# Patient Record
Sex: Female | Born: 1990 | Hispanic: No | Marital: Single | Smoking: Current every day smoker
Health system: Southern US, Community
[De-identification: ages and names within clinical notes are randomized; demographics above are authoritative.]

## PROBLEM LIST (undated history)

## (undated) DIAGNOSIS — Z9889 Other specified postprocedural states: Secondary | ICD-10-CM

## (undated) DIAGNOSIS — F209 Schizophrenia, unspecified: Secondary | ICD-10-CM

## (undated) DIAGNOSIS — D649 Anemia, unspecified: Secondary | ICD-10-CM

## (undated) DIAGNOSIS — J45909 Unspecified asthma, uncomplicated: Secondary | ICD-10-CM

## (undated) DIAGNOSIS — K219 Gastro-esophageal reflux disease without esophagitis: Secondary | ICD-10-CM

## (undated) DIAGNOSIS — E785 Hyperlipidemia, unspecified: Secondary | ICD-10-CM

## (undated) DIAGNOSIS — O1490 Unspecified pre-eclampsia, unspecified trimester: Secondary | ICD-10-CM

## (undated) DIAGNOSIS — F419 Anxiety disorder, unspecified: Secondary | ICD-10-CM

## (undated) DIAGNOSIS — M1990 Unspecified osteoarthritis, unspecified site: Secondary | ICD-10-CM

## (undated) DIAGNOSIS — F319 Bipolar disorder, unspecified: Secondary | ICD-10-CM

## (undated) DIAGNOSIS — G4730 Sleep apnea, unspecified: Secondary | ICD-10-CM

## (undated) HISTORY — PX: TUBAL LIGATION: SHX77

---

## 2010-10-14 ENCOUNTER — Inpatient Hospital Stay (INDEPENDENT_AMBULATORY_CARE_PROVIDER_SITE_OTHER)
Admission: RE | Admit: 2010-10-14 | Discharge: 2010-10-14 | Disposition: A | Discharge status: Home / Self Care | Payer: Medicare Other | Source: Ambulatory Visit

## 2010-10-14 DIAGNOSIS — Z331 Pregnant state, incidental: Secondary | ICD-10-CM

## 2010-11-29 ENCOUNTER — Emergency Department (HOSPITAL_COMMUNITY)
Admission: EM | Admit: 2010-11-29 | Discharge: 2010-11-29 | Disposition: A | Discharge status: Home / Self Care | Payer: Medicare Other | Attending: Emergency Medicine | Admitting: Emergency Medicine

## 2010-11-29 DIAGNOSIS — E876 Hypokalemia: Secondary | ICD-10-CM | POA: Insufficient documentation

## 2010-11-29 DIAGNOSIS — F209 Schizophrenia, unspecified: Secondary | ICD-10-CM | POA: Insufficient documentation

## 2010-11-29 DIAGNOSIS — R45850 Homicidal ideations: Secondary | ICD-10-CM | POA: Insufficient documentation

## 2010-11-29 DIAGNOSIS — F319 Bipolar disorder, unspecified: Secondary | ICD-10-CM | POA: Insufficient documentation

## 2010-11-29 LAB — RAPID URINE DRUG SCREEN, HOSP PERFORMED
Benzodiazepines: NOT DETECTED
Cocaine: NOT DETECTED

## 2010-11-29 LAB — CBC
HCT: 39.2 % (ref 36.0–46.0)
Hemoglobin: 13.7 g/dL (ref 12.0–15.0)
MCH: 29.6 pg (ref 26.0–34.0)
MCHC: 34.9 g/dL (ref 30.0–36.0)

## 2010-11-29 LAB — COMPREHENSIVE METABOLIC PANEL
BUN: 8 mg/dL (ref 6–23)
Calcium: 10 mg/dL (ref 8.4–10.5)
Glucose, Bld: 91 mg/dL (ref 70–99)
Sodium: 132 mEq/L — ABNORMAL LOW (ref 135–145)
Total Protein: 7.9 g/dL (ref 6.0–8.3)

## 2010-11-29 LAB — DIFFERENTIAL
Basophils Absolute: 0.1 10*3/uL (ref 0.0–0.1)
Lymphs Abs: 4 10*3/uL (ref 0.7–4.0)
Monocytes Absolute: 0.8 10*3/uL (ref 0.1–1.0)
Monocytes Relative: 8 % (ref 3–12)
Neutro Abs: 4.7 10*3/uL (ref 1.7–7.7)

## 2010-11-29 LAB — ETHANOL: Alcohol, Ethyl (B): <11 mg/dL (ref 0–11)

## 2013-11-07 ENCOUNTER — Emergency Department (HOSPITAL_BASED_OUTPATIENT_CLINIC_OR_DEPARTMENT_OTHER)
Admission: EM | Admit: 2013-11-07 | Discharge: 2013-11-07 | Disposition: A | Discharge status: Home / Self Care | Payer: Medicaid Other | Attending: Emergency Medicine | Admitting: Emergency Medicine

## 2013-11-07 ENCOUNTER — Emergency Department (HOSPITAL_BASED_OUTPATIENT_CLINIC_OR_DEPARTMENT_OTHER): Payer: Medicaid Other

## 2013-11-07 ENCOUNTER — Encounter (HOSPITAL_BASED_OUTPATIENT_CLINIC_OR_DEPARTMENT_OTHER): Payer: Self-pay | Admitting: Emergency Medicine

## 2013-11-07 DIAGNOSIS — R5383 Other fatigue: Secondary | ICD-10-CM | POA: Diagnosis not present

## 2013-11-07 DIAGNOSIS — R5381 Other malaise: Secondary | ICD-10-CM | POA: Diagnosis not present

## 2013-11-07 DIAGNOSIS — J45909 Unspecified asthma, uncomplicated: Secondary | ICD-10-CM | POA: Insufficient documentation

## 2013-11-07 DIAGNOSIS — R059 Cough, unspecified: Secondary | ICD-10-CM | POA: Diagnosis present

## 2013-11-07 DIAGNOSIS — F17200 Nicotine dependence, unspecified, uncomplicated: Secondary | ICD-10-CM | POA: Insufficient documentation

## 2013-11-07 DIAGNOSIS — R05 Cough: Secondary | ICD-10-CM | POA: Diagnosis present

## 2013-11-07 DIAGNOSIS — Z3202 Encounter for pregnancy test, result negative: Secondary | ICD-10-CM | POA: Diagnosis not present

## 2013-11-07 DIAGNOSIS — J069 Acute upper respiratory infection, unspecified: Secondary | ICD-10-CM | POA: Insufficient documentation

## 2013-11-07 DIAGNOSIS — R1110 Vomiting, unspecified: Secondary | ICD-10-CM | POA: Insufficient documentation

## 2013-11-07 DIAGNOSIS — R6883 Chills (without fever): Secondary | ICD-10-CM | POA: Diagnosis not present

## 2013-11-07 DIAGNOSIS — R072 Precordial pain: Secondary | ICD-10-CM | POA: Diagnosis not present

## 2013-11-07 HISTORY — DX: Hyperlipidemia, unspecified: E78.5

## 2013-11-07 HISTORY — DX: Unspecified asthma, uncomplicated: J45.909

## 2013-11-07 LAB — PREGNANCY, URINE: PREG TEST UR: NEGATIVE

## 2013-11-07 NOTE — ED Provider Notes (Signed)
CSN: [id number]     Arrival date & time 11/07/13  2009 History  This chart was scribed for Hurman Horn, MD by Chestine Spore, ED Scribe. The patient was seen in room MH04/MH04 at 10:04 PM.      Chief Complaint  Patient presents with  . Cough     HPI HPI Comments: Gina Martin is a 23 y.o. female with a h/o asthma who presents to the Emergency Department complaining of a productive cough onset yesterday evening. She states that she is having associated symptoms of generalized weakness, cough, chills, and vomiting. She states that she has had one episode of vomiting yesterday without abdominal pain. She denies confusion, rash, diarrhea, fever, nasal congestion. She states that this does not feel like an asthma flare and is not SOB. She states that she already has an inhaler. She denies medical history of DM.  Past Medical History  Diagnosis Date  . Hyperlipidemia   . Asthma    History reviewed. No pertinent past surgical history. History reviewed. No pertinent family history. History  Substance Use Topics  . Smoking status: Current Every Day Smoker -- 0.50 packs/day    Types: Cigarettes  . Smokeless tobacco: Not on file  . Alcohol Use: No   OB History   Grav Para Term Preterm Abortions TAB SAB Ect Mult Living                 Review of Systems  Constitutional: Positive for chills.  Respiratory: Positive for cough. Negative for shortness of breath.   Gastrointestinal: Positive for vomiting. Negative for diarrhea.  Skin: Negative for rash.  Neurological: Positive for weakness.  Psychiatric/Behavioral: Negative for confusion.    10 Systems reviewed and are negative for acute change except as noted in the HPI.   Allergies  Review of patient's allergies indicates no known allergies.  Home Medications   Prior to Admission medications   Not on File   BP 135/83  Pulse 96  Temp(Src) 99.2 F (37.3 C) (Oral)  Resp 16  Ht 5\' 10"  (1.778 m)  Wt 236 lb (107.049 kg)  BMI  33.86 kg/m2  SpO2 100%  LMP 10/02/2013  Physical Exam  Nursing note and vitals reviewed. Constitutional:  Awake, alert, nontoxic appearance.  HENT:  Head: Atraumatic.  Eyes: Right eye exhibits no discharge. Left eye exhibits no discharge.  Neck: Neck supple.  Cardiovascular: Normal rate and regular rhythm.   No murmur heard. Chest wall mildly tender in the front.  Pulmonary/Chest: Effort normal and breath sounds normal. She has no wheezes. She has no rales. She exhibits no tenderness.  Abdominal: Soft. There is no tenderness. There is no rebound.  Musculoskeletal: She exhibits no tenderness.  Baseline ROM, no obvious new focal weakness.  Neurological:  Mental status and motor strength appears baseline for patient and situation.  Skin: No rash noted.  Psychiatric: She has a normal mood and affect.    ED Course  Procedures (including critical care time) DIAGNOSTIC STUDIES: Oxygen Saturation is 100% on room air, normal by my interpretation.    COORDINATION OF CARE: 10:09 PM-Discussed treatment plan which includes CXR and Pregnancy lab with pt at bedside and pt agreed to plan.   Labs Review Labs Reviewed  PREGNANCY, URINE    Imaging Review Dg Chest 2 View  11/07/2013   CLINICAL DATA:  Cough  EXAM: CHEST  2 VIEW  COMPARISON:  07/27/2012  FINDINGS: The heart size and mediastinal contours are within normal limits. Both lungs are  clear. The visualized skeletal structures are unremarkable.  IMPRESSION: No active cardiopulmonary disease.   Electronically Signed   By: Alcide CleverMark  Lukens M.D.   On: 11/07/2013 21:09     EKG Interpretation None     Patient informed of clinical course, understand medical decision-making process, and agree with plan.  MDM   Final diagnoses:  URI (upper respiratory infection)     I doubt any other EMC precluding discharge at this time including, but not necessarily limited to the following:SBI.  I personally performed the services described in this  documentation, which was scribed in my presence. The recorded information has been reviewed and is accurate.    Hurman HornJohn M Chelsa Stout, MD 11/08/13 [phone]

## 2013-11-07 NOTE — Discharge Instructions (Signed)
Antibiotic Nonuse  Your caregiver felt that the infection or problem was not one that would be helped with an antibiotic. Infections may be caused by viruses or bacteria. Only a caregiver can tell which one of these is the likely cause of an illness. A cold is the most common cause of infection in both adults and children. A cold is a virus. Antibiotic treatment will have no effect on a viral infection. Viruses can lead to many lost days of work caring for sick children and many missed days of school. Children may catch as many as 10 "colds" or "flus" per year during which they can be tearful, cranky, and uncomfortable. The goal of treating a virus is aimed at keeping the ill person comfortable. Antibiotics are medications used to help the body fight bacterial infections. There are relatively few types of bacteria that cause infections but there are hundreds of viruses. While both viruses and bacteria cause infection they are very different types of germs. A viral infection will typically go away by itself within 7 to 10 days. Bacterial infections may spread or get worse without antibiotic treatment. Examples of bacterial infections are:  Sore throats (like strep throat or tonsillitis).  Infection in the lung (pneumonia).  Ear and skin infections. Examples of viral infections are:  Colds or flus.  Most coughs and bronchitis.  Sore throats not caused by Strep.  Runny noses. It is often best not to take an antibiotic when a viral infection is the cause of the problem. Antibiotics can kill off the helpful bacteria that we have inside our body and allow harmful bacteria to start growing. Antibiotics can cause side effects such as allergies, nausea, and diarrhea without helping to improve the symptoms of the viral infection. Additionally, repeated uses of antibiotics can cause bacteria inside of our body to become resistant. That resistance can be passed onto harmful bacterial. The next time you have  an infection it may be harder to treat if antibiotics are used when they are not needed. Not treating with antibiotics allows our own immune system to develop and take care of infections more efficiently. Also, antibiotics will work better for us when they are prescribed for bacterial infections. Treatments for a child that is ill may include:  Give extra fluids throughout the day to stay hydrated.  Get plenty of rest.  Only give your child over-the-counter or prescription medicines for pain, discomfort, or fever as directed by your caregiver.  The use of a cool mist humidifier may help stuffy noses.  Cold medications if suggested by your caregiver. Your caregiver may decide to start you on an antibiotic if:  The problem you were seen for today continues for a longer length of time than expected.  You develop a secondary bacterial infection. SEEK MEDICAL CARE IF:  Fever lasts longer than 5 days.  Symptoms continue to get worse after 5 to 7 days or become severe.  Difficulty in breathing develops.  Signs of dehydration develop (poor drinking, rare urinating, dark colored urine).  Changes in behavior or worsening tiredness (listlessness or lethargy). Document Released: 06/27/2001 Document Revised: 07/11/2011 Document Reviewed: 12/24/2008 [facility] Patient Information 2015 [place], [place]. This information is not intended to replace advice given to you by your health care provider. Make sure you discuss any questions you have with your health care provider.  You appear to have an upper respiratory infection (URI). An upper respiratory tract infection, or cold, is a viral infection of the air passages leading to  the lungs. It is contagious and can be spread to others, especially during the first 3 or 4 days. It cannot be cured by antibiotics or other medicines. RETURN IMMEDIATELY IF you develop shortness of breath, confusion or altered mental status, a new rash, become dizzy, faint, or  poorly responsive, or are unable to be cared for at home.  You may use your inhaler 2 puffs every 3 hours as needed for cough.

## 2013-11-07 NOTE — ED Notes (Signed)
Pt reports flu like symptoms that started yesterday evening, today she has productive cough and weakness

## 2013-12-20 ENCOUNTER — Encounter (HOSPITAL_BASED_OUTPATIENT_CLINIC_OR_DEPARTMENT_OTHER): Payer: Self-pay | Admitting: Emergency Medicine

## 2013-12-20 ENCOUNTER — Emergency Department (HOSPITAL_BASED_OUTPATIENT_CLINIC_OR_DEPARTMENT_OTHER)
Admission: EM | Admit: 2013-12-20 | Discharge: 2013-12-20 | Disposition: A | Discharge status: Home / Self Care | Payer: Medicaid Other | Attending: Emergency Medicine | Admitting: Emergency Medicine

## 2013-12-20 DIAGNOSIS — N949 Unspecified condition associated with female genital organs and menstrual cycle: Secondary | ICD-10-CM | POA: Insufficient documentation

## 2013-12-20 DIAGNOSIS — F319 Bipolar disorder, unspecified: Secondary | ICD-10-CM | POA: Insufficient documentation

## 2013-12-20 DIAGNOSIS — R42 Dizziness and giddiness: Secondary | ICD-10-CM | POA: Insufficient documentation

## 2013-12-20 DIAGNOSIS — R110 Nausea: Secondary | ICD-10-CM | POA: Insufficient documentation

## 2013-12-20 DIAGNOSIS — F17200 Nicotine dependence, unspecified, uncomplicated: Secondary | ICD-10-CM | POA: Insufficient documentation

## 2013-12-20 DIAGNOSIS — N926 Irregular menstruation, unspecified: Secondary | ICD-10-CM

## 2013-12-20 DIAGNOSIS — Z79899 Other long term (current) drug therapy: Secondary | ICD-10-CM | POA: Insufficient documentation

## 2013-12-20 DIAGNOSIS — N938 Other specified abnormal uterine and vaginal bleeding: Secondary | ICD-10-CM | POA: Insufficient documentation

## 2013-12-20 DIAGNOSIS — N898 Other specified noninflammatory disorders of vagina: Secondary | ICD-10-CM | POA: Diagnosis present

## 2013-12-20 DIAGNOSIS — Z862 Personal history of diseases of the blood and blood-forming organs and certain disorders involving the immune mechanism: Secondary | ICD-10-CM | POA: Diagnosis not present

## 2013-12-20 DIAGNOSIS — J45909 Unspecified asthma, uncomplicated: Secondary | ICD-10-CM | POA: Insufficient documentation

## 2013-12-20 DIAGNOSIS — Z3202 Encounter for pregnancy test, result negative: Secondary | ICD-10-CM | POA: Diagnosis not present

## 2013-12-20 DIAGNOSIS — F209 Schizophrenia, unspecified: Secondary | ICD-10-CM | POA: Insufficient documentation

## 2013-12-20 DIAGNOSIS — Z8639 Personal history of other endocrine, nutritional and metabolic disease: Secondary | ICD-10-CM | POA: Insufficient documentation

## 2013-12-20 DIAGNOSIS — N925 Other specified irregular menstruation: Secondary | ICD-10-CM | POA: Diagnosis not present

## 2013-12-20 HISTORY — DX: Schizophrenia, unspecified: F20.9

## 2013-12-20 HISTORY — DX: Bipolar disorder, unspecified: F31.9

## 2013-12-20 LAB — CBC WITH DIFFERENTIAL/PLATELET
BASOS ABS: 0 10*3/uL (ref 0.0–0.1)
BASOS PCT: 0 % (ref 0–1)
EOS ABS: 0.4 10*3/uL (ref 0.0–0.7)
Eosinophils Relative: 4 % (ref 0–5)
HCT: 40.9 % (ref 36.0–46.0)
HEMOGLOBIN: 13.9 g/dL (ref 12.0–15.0)
Lymphocytes Relative: 42 % (ref 12–46)
Lymphs Abs: 3.8 10*3/uL (ref 0.7–4.0)
MCH: 29.7 pg (ref 26.0–34.0)
MCHC: 34 g/dL (ref 30.0–36.0)
MCV: 87.4 fL (ref 78.0–100.0)
MONO ABS: 0.8 10*3/uL (ref 0.1–1.0)
MONOS PCT: 9 % (ref 3–12)
NEUTROS ABS: 4.1 10*3/uL (ref 1.7–7.7)
NEUTROS PCT: 45 % (ref 43–77)
Platelets: 297 10*3/uL (ref 150–400)
RBC: 4.68 MIL/uL (ref 3.87–5.11)
RDW: 12.6 % (ref 11.5–15.5)
WBC: 9.1 10*3/uL (ref 4.0–10.5)

## 2013-12-20 LAB — URINALYSIS, ROUTINE W REFLEX MICROSCOPIC
Bilirubin Urine: NEGATIVE
GLUCOSE, UA: NEGATIVE mg/dL
Hgb urine dipstick: NEGATIVE
KETONES UR: NEGATIVE mg/dL
LEUKOCYTES UA: NEGATIVE
NITRITE: POSITIVE — AB
PROTEIN: NEGATIVE mg/dL
Specific Gravity, Urine: 1.017 (ref 1.005–1.030)
UROBILINOGEN UA: 0.2 mg/dL (ref 0.0–1.0)
pH: 6 (ref 5.0–8.0)

## 2013-12-20 LAB — PREGNANCY, URINE: PREG TEST UR: NEGATIVE

## 2013-12-20 LAB — URINE MICROSCOPIC-ADD ON

## 2013-12-20 MED ORDER — NORGESTREL-ETHINYL ESTRADIOL 0.3-30 MG-MCG PO TABS: 1.0000 | ORAL_TABLET | Freq: Every day | ORAL | Status: DC

## 2013-12-20 NOTE — Discharge Instructions (Signed)
Make an appointment to followup with your OB/GYN or establish care with an OB/GYN. Return immediately for worsening bleeding or for any concerns.  Abnormal Uterine Bleeding Abnormal uterine bleeding can affect women at various stages in life, including teenagers, women in their reproductive years, pregnant women, and women who have reached menopause. Several kinds of uterine bleeding are considered abnormal, including:  Bleeding or spotting between periods.   Bleeding after sexual intercourse.   Bleeding that is heavier or more than normal.   Periods that last longer than usual.  Bleeding after menopause.  Many cases of abnormal uterine bleeding are minor and simple to treat, while others are more serious. Any type of abnormal bleeding should be evaluated by your health care provider. Treatment will depend on the cause of the bleeding. HOME CARE INSTRUCTIONS Monitor your condition for any changes. The following actions may help to alleviate any discomfort you are experiencing:  Avoid the use of tampons and douches as directed by your health care provider.  Change your pads frequently. You should get regular pelvic exams and Pap tests. Keep all follow-up appointments for diagnostic tests as directed by your health care provider.  SEEK MEDICAL CARE IF:   Your bleeding lasts more than 1 week.   You feel dizzy at times.  SEEK IMMEDIATE MEDICAL CARE IF:   You pass out.   You are changing pads every 15 to 30 minutes.   You have abdominal pain.  You have a fever.   You become sweaty or weak.   You are passing large blood clots from the vagina.   You start to feel nauseous and vomit. MAKE SURE YOU:   Understand these instructions.  Will watch your condition.  Will get help right away if you are not doing well or get worse. Document Released: 04/18/2005 Document Revised: 04/23/2013 Document Reviewed: 11/15/2012 [facility] Patient Information 2015 [place], [place].  This information is not intended to replace advice given to you by your health care provider. Make sure you discuss any questions you have with your health care provider.

## 2013-12-20 NOTE — ED Notes (Signed)
Pt states that she was hospitalized at [facility] for difficulties with vaginal bleeding several months ago, but no cause was identified and pt states she stopped bleeding at discharge. Does not remember when exactly she was there for vaginal bleeding, reports that she has had normal periods since that admission until this month

## 2013-12-20 NOTE — ED Notes (Signed)
Pt reports that she has had period since aug 5th, period will be heavy then light then heavy again also reporting left side flank pain

## 2013-12-20 NOTE — ED Provider Notes (Signed)
CSN: [id number]     Arrival date & time 12/20/13  4098 History   First MD Initiated Contact with Patient 12/20/13 0355     Chief Complaint  Patient presents with  . Vaginal Bleeding     (Consider location/radiation/quality/duration/timing/severity/associated sxs/prior Treatment) HPI Patient has a history of irregular vaginal bleeding. She's been on birth control the past to regulate this. She states she is no longer able to afford her birth control. She began having vaginal bleeding on the fifth of this month. She states she using roughly 10-12 pads a day. She is currently having no pain but she has episodic lower abdominal pain. She denies any fevers or chills. She's had mild lightheadedness especially when standing quickly. She has not seen her OB/GYN recently. Denies any urinary symptoms. She complains of episodic mild nausea but no vomiting. Vaginal bleeding is dark blood. No clots or tissue. She has no other vaginal discharge.  Past Medical History  Diagnosis Date  . Hyperlipidemia   . Asthma   . Bipolar 1 disorder   . Schizophrenia    History reviewed. No pertinent past surgical history. History reviewed. No pertinent family history. History  Substance Use Topics  . Smoking status: Current Every Day Smoker -- 0.50 packs/day    Types: Cigarettes  . Smokeless tobacco: Not on file  . Alcohol Use: No   OB History   Grav Para Term Preterm Abortions TAB SAB Ect Mult Living                 Review of Systems  Constitutional: Negative for fever and chills.  Gastrointestinal: Positive for nausea. Negative for vomiting and abdominal pain.  Genitourinary: Positive for vaginal bleeding. Negative for dysuria, frequency, flank pain, vaginal discharge, vaginal pain and pelvic pain.  Musculoskeletal: Negative for back pain.  Skin: Negative for rash and wound.  Neurological: Positive for dizziness and light-headedness. Negative for syncope, weakness, numbness and headaches.  All other  systems reviewed and are negative.     Allergies  Review of patient's allergies indicates no known allergies.  Home Medications   Prior to Admission medications   Medication Sig Start Date End Date Taking? Authorizing Provider  lurasidone (LATUDA) 80 MG TABS tablet Take 80 mg by mouth daily with breakfast.    Historical Provider, MD   BP 129/74  Pulse 91  Temp(Src) 98.2 F (36.8 C) (Oral)  Resp 18  Ht 5\' 10"  (1.778 m)  Wt 230 lb (104.327 kg)  BMI 33.00 kg/m2  SpO2 100% Physical Exam  Nursing note and vitals reviewed. Constitutional: She is oriented to person, place, and time. She appears well-developed and well-nourished. No distress.  HENT:  Head: Normocephalic and atraumatic.  Mouth/Throat: Oropharynx is clear and moist.  Eyes: EOM are normal. Pupils are equal, round, and reactive to light.  Neck: Normal range of motion. Neck supple.  Cardiovascular: Normal rate and regular rhythm.   Pulmonary/Chest: Effort normal and breath sounds normal. No respiratory distress. She has no wheezes. She has no rales.  Abdominal: Soft. Bowel sounds are normal. She exhibits no distension and no mass. There is no tenderness. There is no rebound and no guarding.  Genitourinary:  Dark blood and clots in the vaginal vault. Oozing from the cervical os. No discharge appreciated. No adnexal tenderness or masses.  Musculoskeletal: Normal range of motion. She exhibits no edema and no tenderness.  No CVA tenderness  Neurological: She is alert and oriented to person, place, and time.  Patient is alert and  oriented x3 with clear, goal oriented speech. Patient has 5/5 motor in all extremities. Sensation is intact to light touch. Patient has a normal gait and walks without assistance.   Skin: Skin is warm and dry. No rash noted. No erythema.  Psychiatric: She has a normal mood and affect. Her behavior is normal.    ED Course  Procedures (including critical care time) Labs Review Labs Reviewed   URINALYSIS, ROUTINE W REFLEX MICROSCOPIC  PREGNANCY, URINE  CBC WITH DIFFERENTIAL    Imaging Review No results found.   EKG Interpretation None      MDM   Final diagnoses:  None    Discussed with OB/GYN on call. Advised starting monophasic birth control pill. Also advised following up with her OB/GYN or establish care with an OB/GYN. Patient has been given return precautions.    Ayaana Biondo YelvertLoren Raceron, MD 12/20/13 [phone]

## 2014-01-21 ENCOUNTER — Encounter (HOSPITAL_COMMUNITY): Payer: Self-pay | Admitting: Emergency Medicine

## 2014-01-21 DIAGNOSIS — M542 Cervicalgia: Secondary | ICD-10-CM | POA: Diagnosis not present

## 2014-01-21 DIAGNOSIS — J45909 Unspecified asthma, uncomplicated: Secondary | ICD-10-CM | POA: Diagnosis not present

## 2014-01-21 DIAGNOSIS — R1084 Generalized abdominal pain: Secondary | ICD-10-CM | POA: Insufficient documentation

## 2014-01-21 DIAGNOSIS — M25519 Pain in unspecified shoulder: Secondary | ICD-10-CM | POA: Insufficient documentation

## 2014-01-21 DIAGNOSIS — R51 Headache: Secondary | ICD-10-CM | POA: Insufficient documentation

## 2014-01-21 DIAGNOSIS — F17200 Nicotine dependence, unspecified, uncomplicated: Secondary | ICD-10-CM | POA: Insufficient documentation

## 2014-01-21 LAB — URINALYSIS, ROUTINE W REFLEX MICROSCOPIC
Bilirubin Urine: NEGATIVE
GLUCOSE, UA: NEGATIVE mg/dL
Hgb urine dipstick: NEGATIVE
KETONES UR: NEGATIVE mg/dL
LEUKOCYTES UA: NEGATIVE
NITRITE: NEGATIVE
PROTEIN: NEGATIVE mg/dL
Specific Gravity, Urine: 1.014 (ref 1.005–1.030)
UROBILINOGEN UA: 0.2 mg/dL (ref 0.0–1.0)
pH: 6.5 (ref 5.0–8.0)

## 2014-01-21 LAB — CBC WITH DIFFERENTIAL/PLATELET
Basophils Absolute: 0 10*3/uL (ref 0.0–0.1)
Basophils Relative: 0 % (ref 0–1)
EOS ABS: 0.1 10*3/uL (ref 0.0–0.7)
EOS PCT: 1 % (ref 0–5)
HEMATOCRIT: 40.9 % (ref 36.0–46.0)
Hemoglobin: 13.9 g/dL (ref 12.0–15.0)
LYMPHS ABS: 2.5 10*3/uL (ref 0.7–4.0)
LYMPHS PCT: 19 % (ref 12–46)
MCH: 29.4 pg (ref 26.0–34.0)
MCHC: 34 g/dL (ref 30.0–36.0)
MCV: 86.5 fL (ref 78.0–100.0)
MONO ABS: 0.8 10*3/uL (ref 0.1–1.0)
Monocytes Relative: 6 % (ref 3–12)
Neutro Abs: 9.3 10*3/uL — ABNORMAL HIGH (ref 1.7–7.7)
Neutrophils Relative %: 74 % (ref 43–77)
Platelets: 312 10*3/uL (ref 150–400)
RBC: 4.73 MIL/uL (ref 3.87–5.11)
RDW: 12.7 % (ref 11.5–15.5)
WBC: 12.7 10*3/uL — AB (ref 4.0–10.5)

## 2014-01-21 LAB — COMPREHENSIVE METABOLIC PANEL
ALT: 9 U/L (ref 0–35)
ANION GAP: 13 (ref 5–15)
AST: 12 U/L (ref 0–37)
Albumin: 3.6 g/dL (ref 3.5–5.2)
Alkaline Phosphatase: 96 U/L (ref 39–117)
BUN: 10 mg/dL (ref 6–23)
CALCIUM: 9.6 mg/dL (ref 8.4–10.5)
CO2: 25 meq/L (ref 19–32)
CREATININE: 0.73 mg/dL (ref 0.50–1.10)
Chloride: 99 mEq/L (ref 96–112)
GLUCOSE: 104 mg/dL — AB (ref 70–99)
Potassium: 3.7 mEq/L (ref 3.7–5.3)
Sodium: 137 mEq/L (ref 137–147)
TOTAL PROTEIN: 7.4 g/dL (ref 6.0–8.3)
Total Bilirubin: 0.3 mg/dL (ref 0.3–1.2)

## 2014-01-21 LAB — PREGNANCY, URINE: PREG TEST UR: NEGATIVE

## 2014-01-21 NOTE — ED Notes (Signed)
Pt. reports low abdominal pain with , headache , generalized weakness , neck and shoulder pain onset today , denies fall , no fever or chills.

## 2014-01-22 ENCOUNTER — Emergency Department (HOSPITAL_COMMUNITY)
Admission: EM | Admit: 2014-01-22 | Discharge: 2014-01-22 | Discharge status: Against Medical Advice | Payer: Medicaid Other | Attending: Emergency Medicine | Admitting: Emergency Medicine

## 2014-01-22 NOTE — ED Notes (Signed)
Pt called x 2 for room. No response.

## 2014-01-27 ENCOUNTER — Emergency Department (HOSPITAL_COMMUNITY)
Admission: EM | Admit: 2014-01-27 | Discharge: 2014-01-27 | Discharge status: Against Medical Advice | Payer: Medicaid Other | Attending: Emergency Medicine | Admitting: Emergency Medicine

## 2014-01-27 ENCOUNTER — Encounter (HOSPITAL_COMMUNITY): Payer: Self-pay | Admitting: Emergency Medicine

## 2014-01-27 DIAGNOSIS — R141 Gas pain: Secondary | ICD-10-CM | POA: Insufficient documentation

## 2014-01-27 DIAGNOSIS — J45909 Unspecified asthma, uncomplicated: Secondary | ICD-10-CM | POA: Diagnosis not present

## 2014-01-27 DIAGNOSIS — F17200 Nicotine dependence, unspecified, uncomplicated: Secondary | ICD-10-CM | POA: Diagnosis not present

## 2014-01-27 DIAGNOSIS — R110 Nausea: Secondary | ICD-10-CM | POA: Diagnosis not present

## 2014-01-27 DIAGNOSIS — R109 Unspecified abdominal pain: Secondary | ICD-10-CM | POA: Diagnosis not present

## 2014-01-27 DIAGNOSIS — R142 Eructation: Secondary | ICD-10-CM

## 2014-01-27 DIAGNOSIS — R143 Flatulence: Secondary | ICD-10-CM

## 2014-01-27 LAB — COMPREHENSIVE METABOLIC PANEL
ALK PHOS: 95 U/L (ref 39–117)
ALT: 10 U/L (ref 0–35)
AST: 12 U/L (ref 0–37)
Albumin: 3.6 g/dL (ref 3.5–5.2)
Anion gap: 11 (ref 5–15)
BUN: 13 mg/dL (ref 6–23)
CALCIUM: 9.7 mg/dL (ref 8.4–10.5)
CO2: 26 mEq/L (ref 19–32)
Chloride: 100 mEq/L (ref 96–112)
Creatinine, Ser: 0.68 mg/dL (ref 0.50–1.10)
GFR calc Af Amer: >90 mL/min (ref >90)
GLUCOSE: 97 mg/dL (ref 70–99)
Potassium: 4 mEq/L (ref 3.7–5.3)
SODIUM: 137 meq/L (ref 137–147)
Total Bilirubin: <0.2 mg/dL — ABNORMAL LOW (ref 0.3–1.2)
Total Protein: 7.5 g/dL (ref 6.0–8.3)

## 2014-01-27 LAB — CBC WITH DIFFERENTIAL/PLATELET
Basophils Absolute: 0 10*3/uL (ref 0.0–0.1)
Basophils Relative: 1 % (ref 0–1)
EOS PCT: 2 % (ref 0–5)
Eosinophils Absolute: 0.2 10*3/uL (ref 0.0–0.7)
HEMATOCRIT: 40.6 % (ref 36.0–46.0)
HEMOGLOBIN: 13.8 g/dL (ref 12.0–15.0)
LYMPHS ABS: 2.8 10*3/uL (ref 0.7–4.0)
Lymphocytes Relative: 34 % (ref 12–46)
MCH: 28.9 pg (ref 26.0–34.0)
MCHC: 34 g/dL (ref 30.0–36.0)
MCV: 85.1 fL (ref 78.0–100.0)
MONOS PCT: 5 % (ref 3–12)
Monocytes Absolute: 0.4 10*3/uL (ref 0.1–1.0)
Neutro Abs: 5 10*3/uL (ref 1.7–7.7)
Neutrophils Relative %: 58 % (ref 43–77)
Platelets: 328 10*3/uL (ref 150–400)
RBC: 4.77 MIL/uL (ref 3.87–5.11)
RDW: 12.6 % (ref 11.5–15.5)
WBC: 8.4 10*3/uL (ref 4.0–10.5)

## 2014-01-27 LAB — LIPASE, BLOOD: Lipase: 15 U/L (ref 11–59)

## 2014-01-27 NOTE — ED Notes (Signed)
Pt did not answer when called

## 2014-01-27 NOTE — ED Notes (Signed)
Patient states that she is unable to give urine sample at this time. 

## 2014-01-27 NOTE — ED Notes (Signed)
Pt here for abd pain that started yesterday, nothing makes it better. Feels nauseated, keeps belching. Pain is positional. Lays on right side then that side hurts, etc.

## 2014-02-05 ENCOUNTER — Emergency Department (HOSPITAL_BASED_OUTPATIENT_CLINIC_OR_DEPARTMENT_OTHER)
Admission: EM | Admit: 2014-02-05 | Discharge: 2014-02-06 | Disposition: A | Discharge status: Home / Self Care | Payer: Medicaid Other | Attending: Emergency Medicine | Admitting: Emergency Medicine

## 2014-02-05 ENCOUNTER — Encounter (HOSPITAL_BASED_OUTPATIENT_CLINIC_OR_DEPARTMENT_OTHER): Payer: Self-pay | Admitting: Emergency Medicine

## 2014-02-05 DIAGNOSIS — J039 Acute tonsillitis, unspecified: Secondary | ICD-10-CM | POA: Diagnosis not present

## 2014-02-05 DIAGNOSIS — Z79899 Other long term (current) drug therapy: Secondary | ICD-10-CM | POA: Diagnosis not present

## 2014-02-05 DIAGNOSIS — Z720 Tobacco use: Secondary | ICD-10-CM | POA: Insufficient documentation

## 2014-02-05 DIAGNOSIS — J029 Acute pharyngitis, unspecified: Secondary | ICD-10-CM | POA: Diagnosis present

## 2014-02-05 DIAGNOSIS — F319 Bipolar disorder, unspecified: Secondary | ICD-10-CM | POA: Diagnosis not present

## 2014-02-05 DIAGNOSIS — F209 Schizophrenia, unspecified: Secondary | ICD-10-CM | POA: Diagnosis not present

## 2014-02-05 DIAGNOSIS — Z8639 Personal history of other endocrine, nutritional and metabolic disease: Secondary | ICD-10-CM | POA: Insufficient documentation

## 2014-02-05 DIAGNOSIS — J45909 Unspecified asthma, uncomplicated: Secondary | ICD-10-CM | POA: Insufficient documentation

## 2014-02-05 LAB — RAPID STREP SCREEN (MED CTR MEBANE ONLY): STREPTOCOCCUS, GROUP A SCREEN (DIRECT): NEGATIVE

## 2014-02-05 NOTE — ED Notes (Signed)
Pt reports sore throat x 2 days

## 2014-02-06 MED ORDER — FLUCONAZOLE 150 MG PO TABS: ORAL_TABLET | ORAL | Status: DC

## 2014-02-06 MED ORDER — AMOXICILLIN 500 MG PO CAPS: 1000.0000 mg | ORAL_CAPSULE | Freq: Every day | ORAL | Status: DC

## 2014-02-06 MED ORDER — IBUPROFEN 800 MG PO TABS: 800.0000 mg | ORAL_TABLET | Freq: Three times a day (TID) | ORAL | Status: DC | PRN

## 2014-02-06 MED ORDER — AMOXICILLIN 500 MG PO CAPS: 1000.0000 mg | ORAL_CAPSULE | Freq: Once | ORAL | Status: DC

## 2014-02-06 NOTE — ED Provider Notes (Signed)
CSN: [id number]     Arrival date & time 02/05/14  2259 History   First MD Initiated Contact with Patient 02/06/14 0000     Chief Complaint  Patient presents with  . Sore Throat     (Consider location/radiation/quality/duration/timing/severity/associated sxs/prior Treatment) HPI This is a [age] year old female with a two-day history of sore throat. She states her throat hurts "really bad" and is worse with swallowing. She's also had some nasal congestion. She is not aware of having a fever and denies other systemic symptoms.  Past Medical History  Diagnosis Date  . Hyperlipidemia   . Asthma   . Bipolar 1 disorder   . Schizophrenia    Past Surgical History  Procedure Laterality Date  . Cesarean section     History reviewed. No pertinent family history. History  Substance Use Topics  . Smoking status: Current Every Day Smoker -- 0.50 packs/day    Types: Cigarettes  . Smokeless tobacco: Not on file  . Alcohol Use: No   OB History   Grav Para Term Preterm Abortions TAB SAB Ect Mult Living                 Review of Systems  All other systems reviewed and are negative.  Allergies  Review of patient's allergies indicates no known allergies.  Home Medications   Prior to Admission medications   Medication Sig Start Date End Date Taking? Authorizing Provider  lurasidone (LATUDA) 80 MG TABS tablet Take 80 mg by mouth daily with breakfast.    Historical Provider, MD  norgestrel-ethinyl estradiol (LO/OVRAL,CRYSELLE) 0.3-30 MG-MCG tablet Take 1 tablet by mouth daily. 12/20/13   Loren Raceravid Yelverton, MD   BP 152/77  Pulse 104  Temp(Src) 98.7 F (37.1 C) (Oral)  Resp 20  Ht 5\' 10"  (1.778 m)  Wt 235 lb (106.595 kg)  BMI 33.72 kg/m2  SpO2 100%  LMP 01/26/2014  Physical Exam General: Well-developed, well-nourished female in no acute distress; appearance consistent with age of record HENT: normocephalic; atraumatic; tonsils enlarged with slight exudate; strep-like odor to  breath Eyes: pupils equal, round and reactive to light; extraocular muscles intact Neck: supple; mild anterior cervical lymphadenopathy Heart: regular rate and rhythm Lungs: clear to auscultation bilaterally Abdomen: soft; nondistended; nontender; bowel sounds present Extremities: No deformity; full range of motion; pulses normal Neurologic: Awake, alert and oriented; motor function intact in all extremities and symmetric; no facial droop Skin: Warm and dry Psychiatric: Normal mood and affect    ED Course  Procedures (including critical care time)  MDM   Nursing notes and vitals signs, including pulse oximetry, reviewed.  Summary of this visit's results, reviewed by myself:  Labs:  Results for orders placed during the hospital encounter of 02/05/14 (from the past 24 hour(s))  RAPID STREP SCREEN     Status: None   Collection Time    02/05/14 11:09 PM      Result Value Ref Range   Streptococcus, Group A Screen (Direct) NEGATIVE  NEGATIVE       Hanley SeamenJohn L Wladyslawa Disbro, MD 02/06/14 0006

## 2014-02-06 NOTE — ED Notes (Signed)
MD at bedside. 

## 2014-02-08 LAB — CULTURE, GROUP A STREP

## 2014-02-13 ENCOUNTER — Emergency Department (HOSPITAL_BASED_OUTPATIENT_CLINIC_OR_DEPARTMENT_OTHER)
Admission: EM | Admit: 2014-02-13 | Discharge: 2014-02-13 | Disposition: A | Discharge status: Home / Self Care | Payer: Medicaid Other | Attending: Emergency Medicine | Admitting: Emergency Medicine

## 2014-02-13 ENCOUNTER — Encounter (HOSPITAL_BASED_OUTPATIENT_CLINIC_OR_DEPARTMENT_OTHER): Payer: Self-pay | Admitting: Emergency Medicine

## 2014-02-13 DIAGNOSIS — J069 Acute upper respiratory infection, unspecified: Secondary | ICD-10-CM | POA: Diagnosis not present

## 2014-02-13 DIAGNOSIS — Z792 Long term (current) use of antibiotics: Secondary | ICD-10-CM | POA: Insufficient documentation

## 2014-02-13 DIAGNOSIS — F319 Bipolar disorder, unspecified: Secondary | ICD-10-CM | POA: Diagnosis not present

## 2014-02-13 DIAGNOSIS — Z8639 Personal history of other endocrine, nutritional and metabolic disease: Secondary | ICD-10-CM | POA: Diagnosis not present

## 2014-02-13 DIAGNOSIS — J45909 Unspecified asthma, uncomplicated: Secondary | ICD-10-CM | POA: Insufficient documentation

## 2014-02-13 DIAGNOSIS — Z720 Tobacco use: Secondary | ICD-10-CM | POA: Diagnosis not present

## 2014-02-13 DIAGNOSIS — R0981 Nasal congestion: Secondary | ICD-10-CM | POA: Diagnosis present

## 2014-02-13 DIAGNOSIS — Z79899 Other long term (current) drug therapy: Secondary | ICD-10-CM | POA: Diagnosis not present

## 2014-02-13 DIAGNOSIS — H6692 Otitis media, unspecified, left ear: Secondary | ICD-10-CM | POA: Insufficient documentation

## 2014-02-13 DIAGNOSIS — F209 Schizophrenia, unspecified: Secondary | ICD-10-CM | POA: Diagnosis not present

## 2014-02-13 MED ORDER — AMOXICILLIN 500 MG PO CAPS: 500.0000 mg | ORAL_CAPSULE | Freq: Three times a day (TID) | ORAL | Status: DC

## 2014-02-13 MED ORDER — FLUCONAZOLE 150 MG PO TABS
150.0000 mg | ORAL_TABLET | Freq: Every day | ORAL | Status: DC
Start: 2014-02-13 — End: 2016-09-03

## 2014-02-13 NOTE — ED Provider Notes (Signed)
CSN: [id number]     Arrival date & time 02/13/14  2146 History  This chart was scribed for Geoffery Lyonsouglas Zali Kamaka, MD by Bronson CurbJacqueline Melvin, ED Scribe. This patient was seen in room MH06/MH06 and the patient's care was started at 11:38 PM.   Chief Complaint  Patient presents with  . URI    The history is provided by the patient. No language interpreter was used.    HPI Comments: Gina Martin is a 23 y.o. female who presents to the Emergency Department complaining of nasal congestion for the past 2 days. There is associated neck pain and decreased hearing in the left ear.  Patient states she can hear fluid moving in her left ear. She also reports she is unable to smell or taste. Patient denies history of sinus infections. Patient has history of asthma, HLD, bipolar 1 disorder, and schizophrenia.   Past Medical History  Diagnosis Date  . Hyperlipidemia   . Asthma   . Bipolar 1 disorder   . Schizophrenia    Past Surgical History  Procedure Laterality Date  . Cesarean section     No family history on file. History  Substance Use Topics  . Smoking status: Current Every Day Smoker -- 0.50 packs/day    Types: Cigarettes  . Smokeless tobacco: Not on file  . Alcohol Use: No   OB History   Grav Para Term Preterm Abortions TAB SAB Ect Mult Living                 Review of Systems A complete 10 system review of systems was obtained and all systems are negative except as noted in the HPI and PMH.     Allergies  Review of patient's allergies indicates no known allergies.  Home Medications   Prior to Admission medications   Medication Sig Start Date End Date Taking? Authorizing Provider  amoxicillin (AMOXIL) 500 MG capsule Take 2 capsules (1,000 mg total) by mouth daily. 02/06/14   John L Molpus, MD  fluconazole (DIFLUCAN) 150 MG tablet Take one capsule as needed for vaginal yeast infection. May repeat in 3 days if needed. 02/06/14   Carlisle BeersJohn L Molpus, MD  ibuprofen (ADVIL,MOTRIN) 800 MG  tablet Take 1 tablet (800 mg total) by mouth every 8 (eight) hours as needed (for pain). 02/06/14   John L Molpus, MD  lurasidone (LATUDA) 80 MG TABS tablet Take 80 mg by mouth daily with breakfast.    Historical Provider, MD  norgestrel-ethinyl estradiol (LO/OVRAL,CRYSELLE) 0.3-30 MG-MCG tablet Take 1 tablet by mouth daily. 12/20/13   Loren Raceravid Yelverton, MD   Triage Vitals: BP 109/70  Pulse 105  Temp(Src) 98.3 F (36.8 C) (Oral)  Resp 20  Ht 5\' 10"  (1.778 m)  Wt 230 lb (104.327 kg)  BMI 33.00 kg/m2  SpO2 100%  LMP 02/13/2014  Physical Exam  Nursing note and vitals reviewed. Constitutional: She is oriented to person, place, and time. She appears well-developed and well-nourished. No distress.  HENT:  Head: Normocephalic and atraumatic.  Mouth/Throat: Oropharynx is clear and moist.  The left TM is erythematous and swollen.  Eyes: Conjunctivae and EOM are normal.  Neck: Neck supple. No tracheal deviation present.  Cardiovascular: Normal rate, regular rhythm and normal heart sounds.   No murmur heard. Pulmonary/Chest: Effort normal and breath sounds normal. No respiratory distress. She has no wheezes. She has no rales.  Musculoskeletal: Normal range of motion.  Neurological: She is alert and oriented to person, place, and time.  Skin: Skin is warm  and dry.  Psychiatric: She has a normal mood and affect. Her behavior is normal.    ED Course  Procedures (including critical care time)  DIAGNOSTIC STUDIES: Oxygen Saturation is 100% on room air, normal by my interpretation.    COORDINATION OF CARE: At 2341 Discussed treatment plan with patient which includes ABX and OTC decongestants. Patient agrees.   Labs Review Labs Reviewed - No data to display  Imaging Review No results found.   EKG Interpretation None      MDM   Final diagnoses:  None    URI with otitis media. Will treat with amoxicillin and when necessary followup.  I personally performed the services described  in this documentation, which was scribed in my presence. The recorded information has been reviewed and is accurate.    Geoffery Lyons`  Alaiah Lundy, MD 02/14/14 [phone]

## 2014-02-13 NOTE — ED Notes (Signed)
C/o ears stopped up and congestion x 2 days

## 2014-02-13 NOTE — ED Notes (Signed)
C/o sinus congestion and right earache x 2 days

## 2014-02-13 NOTE — Discharge Instructions (Signed)
Amoxicillin as prescribed.  Sudafed as needed for congestion.  Return to the emergency department if he develops severe chest pain, difficulty breathing, and followup with your primary Dr. if not improving in the next week.   Otitis Media Otitis media is redness, soreness, and inflammation of the middle ear. Otitis media may be caused by allergies or, most commonly, by infection. Often it occurs as a complication of the common cold. SIGNS AND SYMPTOMS Symptoms of otitis media may include:  Earache.  Fever.  Ringing in your ear.  Headache.  Leakage of fluid from the ear. DIAGNOSIS To diagnose otitis media, your health care provider will examine your ear with an otoscope. This is an instrument that allows your health care provider to see into your ear in order to examine your eardrum. Your health care provider also will ask you questions about your symptoms. TREATMENT  Typically, otitis media resolves on its own within 3-5 days. Your health care provider may prescribe medicine to ease your symptoms of pain. If otitis media does not resolve within 5 days or is recurrent, your health care provider may prescribe antibiotic medicines if he or she suspects that a bacterial infection is the cause. HOME CARE INSTRUCTIONS   If you were prescribed an antibiotic medicine, finish it all even if you start to feel better.  Take medicines only as directed by your health care provider.  Keep all follow-up visits as directed by your health care provider. SEEK MEDICAL CARE IF:  You have otitis media only in one ear, or bleeding from your nose, or both.  You notice a lump on your neck.  You are not getting better in 3-5 days.  You feel worse instead of better. SEEK IMMEDIATE MEDICAL CARE IF:   You have pain that is not controlled with medicine.  You have swelling, redness, or pain around your ear or stiffness in your neck.  You notice that part of your face is paralyzed.  You notice that  the bone behind your ear (mastoid) is tender when you touch it. MAKE SURE YOU:   Understand these instructions.  Will watch your condition.  Will get help right away if you are not doing well or get worse. Document Released: 01/22/2004 Document Revised: 09/02/2013 Document Reviewed: 11/13/2012 [facility] Patient Information 2015 [employer], [place]. This information is not intended to replace advice given to you by your health care provider. Make sure you discuss any questions you have with your health care provider.

## 2014-07-24 ENCOUNTER — Encounter (HOSPITAL_BASED_OUTPATIENT_CLINIC_OR_DEPARTMENT_OTHER): Payer: Self-pay | Admitting: *Deleted

## 2014-07-24 ENCOUNTER — Emergency Department (HOSPITAL_BASED_OUTPATIENT_CLINIC_OR_DEPARTMENT_OTHER)
Admission: EM | Admit: 2014-07-24 | Discharge: 2014-07-25 | Disposition: A | Discharge status: Home / Self Care | Payer: Medicaid Other | Attending: Emergency Medicine | Admitting: Emergency Medicine

## 2014-07-24 DIAGNOSIS — Z3202 Encounter for pregnancy test, result negative: Secondary | ICD-10-CM | POA: Insufficient documentation

## 2014-07-24 DIAGNOSIS — F319 Bipolar disorder, unspecified: Secondary | ICD-10-CM | POA: Diagnosis not present

## 2014-07-24 DIAGNOSIS — Z793 Long term (current) use of hormonal contraceptives: Secondary | ICD-10-CM | POA: Insufficient documentation

## 2014-07-24 DIAGNOSIS — Z9889 Other specified postprocedural states: Secondary | ICD-10-CM | POA: Insufficient documentation

## 2014-07-24 DIAGNOSIS — Z720 Tobacco use: Secondary | ICD-10-CM | POA: Insufficient documentation

## 2014-07-24 DIAGNOSIS — Z792 Long term (current) use of antibiotics: Secondary | ICD-10-CM | POA: Insufficient documentation

## 2014-07-24 DIAGNOSIS — R1012 Left upper quadrant pain: Secondary | ICD-10-CM

## 2014-07-24 DIAGNOSIS — Z79899 Other long term (current) drug therapy: Secondary | ICD-10-CM | POA: Insufficient documentation

## 2014-07-24 DIAGNOSIS — J45909 Unspecified asthma, uncomplicated: Secondary | ICD-10-CM | POA: Insufficient documentation

## 2014-07-24 DIAGNOSIS — Z8639 Personal history of other endocrine, nutritional and metabolic disease: Secondary | ICD-10-CM | POA: Insufficient documentation

## 2014-07-24 DIAGNOSIS — F209 Schizophrenia, unspecified: Secondary | ICD-10-CM | POA: Insufficient documentation

## 2014-07-24 NOTE — ED Notes (Signed)
Pt. Reports she was seen at [facility] in early March and was told she may have Gallstones.

## 2014-07-25 LAB — COMPREHENSIVE METABOLIC PANEL
ALT: 11 U/L (ref 0–35)
AST: 14 U/L (ref 0–37)
Albumin: 3.9 g/dL (ref 3.5–5.2)
Alkaline Phosphatase: 88 U/L (ref 39–117)
Anion gap: 7 (ref 5–15)
BILIRUBIN TOTAL: 0.4 mg/dL (ref 0.3–1.2)
BUN: 19 mg/dL (ref 6–23)
CHLORIDE: 105 mmol/L (ref 96–112)
CO2: 26 mmol/L (ref 19–32)
Calcium: 9.3 mg/dL (ref 8.4–10.5)
Creatinine, Ser: 0.78 mg/dL (ref 0.50–1.10)
GFR calc Af Amer: >90 mL/min (ref >90)
GLUCOSE: 102 mg/dL — AB (ref 70–99)
POTASSIUM: 3.2 mmol/L — AB (ref 3.5–5.1)
Sodium: 138 mmol/L (ref 135–145)
Total Protein: 7.2 g/dL (ref 6.0–8.3)

## 2014-07-25 LAB — CBC WITH DIFFERENTIAL/PLATELET
Basophils Absolute: 0 10*3/uL (ref 0.0–0.1)
Basophils Relative: 0 % (ref 0–1)
Eosinophils Absolute: 0.3 10*3/uL (ref 0.0–0.7)
Eosinophils Relative: 3 % (ref 0–5)
HCT: 39.2 % (ref 36.0–46.0)
Hemoglobin: 12.9 g/dL (ref 12.0–15.0)
Lymphocytes Relative: 39 % (ref 12–46)
Lymphs Abs: 3.5 10*3/uL (ref 0.7–4.0)
MCH: 28.7 pg (ref 26.0–34.0)
MCHC: 32.9 g/dL (ref 30.0–36.0)
MCV: 87.3 fL (ref 78.0–100.0)
Monocytes Absolute: 0.6 10*3/uL (ref 0.1–1.0)
Monocytes Relative: 7 % (ref 3–12)
NEUTROS PCT: 51 % (ref 43–77)
Neutro Abs: 4.5 10*3/uL (ref 1.7–7.7)
PLATELETS: 318 10*3/uL (ref 150–400)
RBC: 4.49 MIL/uL (ref 3.87–5.11)
RDW: 13.1 % (ref 11.5–15.5)
WBC: 8.9 10*3/uL (ref 4.0–10.5)

## 2014-07-25 LAB — URINALYSIS, ROUTINE W REFLEX MICROSCOPIC
BILIRUBIN URINE: NEGATIVE
Glucose, UA: NEGATIVE mg/dL
Hgb urine dipstick: NEGATIVE
KETONES UR: NEGATIVE mg/dL
Nitrite: NEGATIVE
PH: 6 (ref 5.0–8.0)
Protein, ur: NEGATIVE mg/dL
SPECIFIC GRAVITY, URINE: 1.029 (ref 1.005–1.030)
UROBILINOGEN UA: 1 mg/dL (ref 0.0–1.0)

## 2014-07-25 LAB — URINE MICROSCOPIC-ADD ON

## 2014-07-25 LAB — PREGNANCY, URINE: Preg Test, Ur: NEGATIVE

## 2014-07-25 LAB — LIPASE, BLOOD: LIPASE: 25 U/L (ref 11–59)

## 2014-07-25 MED ORDER — OXYCODONE-ACETAMINOPHEN 5-325 MG PO TABS: 1.0000 | ORAL_TABLET | ORAL | Status: DC | PRN

## 2014-07-25 MED ORDER — ONDANSETRON HCL 4 MG PO TABS: 4.0000 mg | ORAL_TABLET | Freq: Four times a day (QID) | ORAL | Status: DC

## 2014-07-25 MED ORDER — ONDANSETRON HCL 4 MG/2ML IJ SOLN
4.0000 mg | Freq: Once | INTRAMUSCULAR | Status: AC
  Administered 2014-07-25: 4 mg via INTRAVENOUS
  Filled 2014-07-25: qty 2

## 2014-07-25 MED ORDER — MORPHINE SULFATE 4 MG/ML IJ SOLN: 4.0000 mg | Freq: Once | INTRAMUSCULAR | Status: DC

## 2014-07-25 MED ORDER — SODIUM CHLORIDE 0.9 % IV SOLN
Freq: Once | INTRAVENOUS | Status: AC
  Administered 2014-07-25: 1000 mL via INTRAVENOUS

## 2014-07-25 NOTE — Discharge Instructions (Signed)
Abdominal Pain Many things can cause abdominal pain. Usually, abdominal pain is not caused by a disease and will improve without treatment. It can often be observed and treated at home. Your health care provider will do a physical exam and possibly order blood tests and X-rays to help determine the seriousness of your pain. However, in many cases, more time must pass before a clear cause of the pain can be found. Before that point, your health care provider may not know if you need more testing or further treatment. HOME CARE INSTRUCTIONS  Monitor your abdominal pain for any changes. The following actions may help to alleviate any discomfort you are experiencing:  Only take over-the-counter or prescription medicines as directed by your health care provider.  Do not take laxatives unless directed to do so by your health care provider.  Try a clear liquid diet (broth, tea, or water) as directed by your health care provider. Slowly move to a bland diet as tolerated. SEEK MEDICAL CARE IF:  You have unexplained abdominal pain.  You have abdominal pain associated with nausea or diarrhea.  You have pain when you urinate or have a bowel movement.  You experience abdominal pain that wakes you in the night.  You have abdominal pain that is worsened or improved by eating food.  You have abdominal pain that is worsened with eating fatty foods.  You have a fever. SEEK IMMEDIATE MEDICAL CARE IF:   Your pain does not go away within 2 hours.  You keep throwing up (vomiting).  Your pain is felt only in portions of the abdomen, such as the right side or the left lower portion of the abdomen.  You pass bloody or black tarry stools. MAKE SURE YOU:  Understand these instructions.   Will watch your condition.   Will get help right away if you are not doing well or get worse.  Document Released: 01/26/2005 Document Revised: 04/23/2013 Document Reviewed: 12/26/2012 [facility] Patient Information  2015 [place], [place]. This information is not intended to replace advice given to you by your health care provider. Make sure you discuss any questions you have with your health care provider.  Acetaminophen; Oxycodone tablets What is this medicine? ACETAMINOPHEN; OXYCODONE (a set a MEE noe fen; ox i KOE done) is a pain reliever. It is used to treat mild to moderate pain. This medicine may be used for other purposes; ask your health care provider or pharmacist if you have questions. COMMON BRAND NAME(S): Endocet, Magnacet, Narvox, Percocet, Perloxx, Primalev, Primlev, Roxicet, Xolox What should I tell my health care provider before I take this medicine? They need to know if you have any of these conditions: -brain tumor -Crohn's disease, inflammatory bowel disease, or ulcerative colitis -drug abuse or addiction -head injury -heart or circulation problems -if you often drink alcohol -kidney disease or problems going to the bathroom -liver disease -lung disease, asthma, or breathing problems -an unusual or allergic reaction to acetaminophen, oxycodone, other opioid analgesics, other medicines, foods, dyes, or preservatives -pregnant or trying to get pregnant -breast-feeding How should I use this medicine? Take this medicine by mouth with a full glass of water. Follow the directions on the prescription label. Take your medicine at regular intervals. Do not take your medicine more often than directed. Talk to your pediatrician regarding the use of this medicine in children. Special care may be needed. Patients over 24 years old may have a stronger reaction and need a smaller dose. Overdosage: If you think you have  taken too much of this medicine contact a poison control center or emergency room at once. NOTE: This medicine is only for you. Do not share this medicine with others. What if I miss a dose? If you miss a dose, take it as soon as you can. If it is almost time for your next dose, take  only that dose. Do not take double or extra doses. What may interact with this medicine? -alcohol -antihistamines -barbiturates like amobarbital, butalbital, butabarbital, methohexital, pentobarbital, phenobarbital, thiopental, and secobarbital -benztropine -drugs for bladder problems like solifenacin, trospium, oxybutynin, tolterodine, hyoscyamine, and methscopolamine -drugs for breathing problems like ipratropium and tiotropium -drugs for certain stomach or intestine problems like propantheline, homatropine methylbromide, glycopyrrolate, atropine, belladonna, and dicyclomine -general anesthetics like etomidate, ketamine, nitrous oxide, propofol, desflurane, enflurane, halothane, isoflurane, and sevoflurane -medicines for depression, anxiety, or psychotic disturbances -medicines for sleep -muscle relaxants -naltrexone -narcotic medicines (opiates) for pain -phenothiazines like perphenazine, thioridazine, chlorpromazine, mesoridazine, fluphenazine, prochlorperazine, promazine, and trifluoperazine -scopolamine -tramadol -trihexyphenidyl This list may not describe all possible interactions. Give your health care provider a list of all the medicines, herbs, non-prescription drugs, or dietary supplements you use. Also tell them if you smoke, drink alcohol, or use illegal drugs. Some items may interact with your medicine. What should I watch for while using this medicine? Tell your doctor or health care professional if your pain does not go away, if it gets worse, or if you have new or a different type of pain. You may develop tolerance to the medicine. Tolerance means that you will need a higher dose of the medication for pain relief. Tolerance is normal and is expected if you take this medicine for a long time. Do not suddenly stop taking your medicine because you may develop a severe reaction. Your body becomes used to the medicine. This does NOT mean you are addicted. Addiction is a behavior  related to getting and using a drug for a non-medical reason. If you have pain, you have a medical reason to take pain medicine. Your doctor will tell you how much medicine to take. If your doctor wants you to stop the medicine, the dose will be slowly lowered over time to avoid any side effects. You may get drowsy or dizzy. Do not drive, use machinery, or do anything that needs mental alertness until you know how this medicine affects you. Do not stand or sit up quickly, especially if you are an older patient. This reduces the risk of dizzy or fainting spells. Alcohol may interfere with the effect of this medicine. Avoid alcoholic drinks. There are different types of narcotic medicines (opiates) for pain. If you take more than one type at the same time, you may have more side effects. Give your health care provider a list of all medicines you use. Your doctor will tell you how much medicine to take. Do not take more medicine than directed. Call emergency for help if you have problems breathing. The medicine will cause constipation. Try to have a bowel movement at least every 2 to 3 days. If you do not have a bowel movement for 3 days, call your doctor or health care professional. Do not take Tylenol (acetaminophen) or medicines that have acetaminophen with this medicine. Too much acetaminophen can be very dangerous. Many nonprescription medicines contain acetaminophen. Always read the labels carefully to avoid taking more acetaminophen. What side effects may I notice from receiving this medicine? Side effects that you should report to your doctor or health care  professional as soon as possible: -allergic reactions like skin rash, itching or hives, swelling of the face, lips, or tongue -breathing difficulties, wheezing -confusion -light headedness or fainting spells -severe stomach pain -unusually weak or tired -yellowing of the skin or the whites of the eyes Side effects that usually do not require  medical attention (report to your doctor or health care professional if they continue or are bothersome): -dizziness -drowsiness -nausea -vomiting This list may not describe all possible side effects. Call your doctor for medical advice about side effects. You may report side effects to FDA at 1-800-FDA-1088. Where should I keep my medicine? Keep out of the reach of children. This medicine can be abused. Keep your medicine in a safe place to protect it from theft. Do not share this medicine with anyone. Selling or giving away this medicine is dangerous and against the law. Store at room temperature between 20 and 25 degrees C (68 and 77 degrees F). Keep container tightly closed. Protect from light. This medicine may cause accidental overdose and death if it is taken by other adults, children, or pets. Flush any unused medicine down the toilet to reduce the chance of harm. Do not use the medicine after the expiration date. NOTE: This sheet is a summary. It may not cover all possible information. If you have questions about this medicine, talk to your doctor, pharmacist, or health care provider.  2015, Elsevier/Gold Standard. (2012-12-10 13:17:35)  Ondansetron tablets What is this medicine? ONDANSETRON (on DAN se tron) is used to treat nausea and vomiting caused by chemotherapy. It is also used to prevent or treat nausea and vomiting after surgery. This medicine may be used for other purposes; ask your health care provider or pharmacist if you have questions. COMMON BRAND NAME(S): Zofran What should I tell my health care provider before I take this medicine? They need to know if you have any of these conditions: -heart disease -history of irregular heartbeat -liver disease -low levels of magnesium or potassium in the blood -an unusual or allergic reaction to ondansetron, granisetron, other medicines, foods, dyes, or preservatives -pregnant or trying to get pregnant -breast-feeding How  should I use this medicine? Take this medicine by mouth with a glass of water. Follow the directions on your prescription label. Take your doses at regular intervals. Do not take your medicine more often than directed. Talk to your pediatrician regarding the use of this medicine in children. Special care may be needed. Overdosage: If you think you have taken too much of this medicine contact a poison control center or emergency room at once. NOTE: This medicine is only for you. Do not share this medicine with others. What if I miss a dose? If you miss a dose, take it as soon as you can. If it is almost time for your next dose, take only that dose. Do not take double or extra doses. What may interact with this medicine? Do not take this medicine with any of the following medications: -apomorphine -certain medicines for fungal infections like fluconazole, itraconazole, ketoconazole, posaconazole, voriconazole -cisapride -dofetilide -dronedarone -pimozide -thioridazine -ziprasidone This medicine may also interact with the following medications: -carbamazepine -certain medicines for depression, anxiety, or psychotic disturbances -fentanyl -linezolid -MAOIs like Carbex, Eldepryl, Marplan, Nardil, and Parnate -methylene blue (injected into a vein) -other medicines that prolong the QT interval (cause an abnormal heart rhythm) -phenytoin -rifampicin -tramadol This list may not describe all possible interactions. Give your health care provider a list of all the medicines,  herbs, non-prescription drugs, or dietary supplements you use. Also tell them if you smoke, drink alcohol, or use illegal drugs. Some items may interact with your medicine. What should I watch for while using this medicine? Check with your doctor or health care professional right away if you have any sign of an allergic reaction. What side effects may I notice from receiving this medicine? Side effects that you should report  to your doctor or health care professional as soon as possible: -allergic reactions like skin rash, itching or hives, swelling of the face, lips or tongue -breathing problems -confusion -dizziness -fast or irregular heartbeat -feeling faint or lightheaded, falls -fever and chills -loss of balance or coordination -seizures -sweating -swelling of the hands or feet -tightness in the chest -tremors -unusually weak or tired Side effects that usually do not require medical attention (report to your doctor or health care professional if they continue or are bothersome): -constipation or diarrhea -headache This list may not describe all possible side effects. Call your doctor for medical advice about side effects. You may report side effects to FDA at 1-800-FDA-1088. Where should I keep my medicine? Keep out of the reach of children. Store between 2 and 30 degrees C (36 and 86 degrees F). Throw away any unused medicine after the expiration date. NOTE: This sheet is a summary. It may not cover all possible information. If you have questions about this medicine, talk to your doctor, pharmacist, or health care provider.  2015, Elsevier/Gold Standard. (2013-01-23 16:27:45)

## 2014-07-25 NOTE — ED Provider Notes (Signed)
CSN: [id number]     Arrival date & time 07/24/14  2334 History   First MD Initiated Contact with Patient 07/25/14 0008     Chief Complaint  Patient presents with  . Flank Pain     (Consider location/radiation/quality/duration/timing/severity/associated sxs/prior Treatment) Patient is a 24 y.o. female presenting with flank pain. The history is provided by the patient.  Flank Pain  She complains of waxing and waning left upper quadrant pain for more than a month. [facility] and had a CAT scan which showed gallstones and a possible ulcer. She was sent home on a medicine for ulcers but she is not sure what it was. She was also given a prescription for tramadol for pain. Pain got worse today. She describes a sharp pain in the left upper abdomen without radiation. There is associated nausea and vomiting. She denies fever or chills. She denies constipation or diarrhea. She denies urinary urgency, frequency, tenesmus, or dysuria. Nothing makes her pain better nothing makes it worse. She has had anorexia today. She claims compliance with the also medication. She did take a dose of tramadol today which did not give her any relief of pain.  Past Medical History  Diagnosis Date  . Hyperlipidemia   . Asthma   . Bipolar 1 disorder   . Schizophrenia    Past Surgical History  Procedure Laterality Date  . Cesarean section     No family history on file. History  Substance Use Topics  . Smoking status: Current Every Day Smoker -- 0.50 packs/day    Types: Cigarettes  . Smokeless tobacco: Not on file  . Alcohol Use: No   OB History    No data available     Review of Systems  Genitourinary: Positive for flank pain.  All other systems reviewed and are negative.     Allergies  Review of patient's allergies indicates no known allergies.  Home Medications   Prior to Admission medications   Medication Sig Start Date End  Date Taking? Authorizing Provider  traMADol (ULTRAM) 50 MG tablet Take 50 mg by mouth every 6 (six) hours as needed.   Yes Historical Provider, MD  amoxicillin (AMOXIL) 500 MG capsule Take 2 capsules (1,000 mg total) by mouth daily. 02/06/14   John Molpus, MD  amoxicillin (AMOXIL) 500 MG capsule Take 1 capsule (500 mg total) by mouth 3 (three) times daily. 02/13/14   Geoffery Lyonsouglas Delo, MD  fluconazole (DIFLUCAN) 150 MG tablet Take one capsule as needed for vaginal yeast infection. May repeat in 3 days if needed. 02/06/14   John Molpus, MD  fluconazole (DIFLUCAN) 150 MG tablet Take 1 tablet (150 mg total) by mouth daily. 02/13/14   Geoffery Lyonsouglas Delo, MD  ibuprofen (ADVIL,MOTRIN) 800 MG tablet Take 1 tablet (800 mg total) by mouth every 8 (eight) hours as needed (for pain). 02/06/14   John Molpus, MD  lurasidone (LATUDA) 80 MG TABS tablet Take 80 mg by mouth daily with breakfast.    Historical Provider, MD  norgestrel-ethinyl estradiol (LO/OVRAL,CRYSELLE) 0.3-30 MG-MCG tablet Take 1 tablet by mouth daily. 12/20/13   Loren Raceravid Yelverton, MD   BP 129/67 mmHg  Pulse 90  Temp(Src) 98.7 F (37.1 C) (Oral)  Resp 17  Ht 5\' 11"  (1.803 m)  Wt 235 lb (106.595 kg)  BMI 32.79 kg/m2  SpO2 100%  LMP 07/15/2014 Physical Exam  Nursing note and vitals reviewed.  24 year old female, resting comfortably and  in no acute distress. Vital signs are  normal. Oxygen saturation is 100%, which is normal. Head is normocephalic and atraumatic. PERRLA, EOMI. Oropharynx is clear. Neck is nontender and supple without adenopathy or JVD. Back is nontender and there is no CVA tenderness. Lungs are clear without rales, wheezes, or rhonchi. Chest is nontender. Heart has regular rate and rhythm without murmur. Abdomen is soft, flat,  with mild tenderness in the left upper abdomen. There is absolutely no right upper quadrant tenderness. There is no rebound or guarding. There are no masses or hepatosplenomegaly and peristalsis is  hypoactive. Extremities have no cyanosis or edema, full range of motion is present. Skin is warm and dry without rash. Neurologic: Mental status is normal, cranial nerves are intact, there are no motor or sensory deficits.  ED Course  Procedures (including critical care time) Labs Review Results for orders placed or performed during the hospital encounter of 07/24/14  Urinalysis, Routine w reflex microscopic  Result Value Ref Range   Color, Urine YELLOW YELLOW   APPearance CLOUDY (A) CLEAR   Specific Gravity, Urine 1.029 1.005 - 1.030   pH 6.0 5.0 - 8.0   Glucose, UA NEGATIVE NEGATIVE mg/dL   Hgb urine dipstick NEGATIVE NEGATIVE   Bilirubin Urine NEGATIVE NEGATIVE   Ketones, ur NEGATIVE NEGATIVE mg/dL   Protein, ur NEGATIVE NEGATIVE mg/dL   Urobilinogen, UA 1.0 0.0 - 1.0 mg/dL   Nitrite NEGATIVE NEGATIVE   Leukocytes, UA MODERATE (A) NEGATIVE  Pregnancy, urine  Result Value Ref Range   Preg Test, Ur NEGATIVE NEGATIVE  Urine microscopic-add on  Result Value Ref Range   Squamous Epithelial / LPF FEW (A) RARE   WBC, UA 11-20 <3 WBC/hpf   RBC / HPF 0-2 <3 RBC/hpf   Bacteria, UA MANY (A) RARE   Urine-Other MUCOUS PRESENT   Comprehensive metabolic panel  Result Value Ref Range   Sodium 138 135 - 145 mmol/L   Potassium 3.2 (L) 3.5 - 5.1 mmol/L   Chloride 105 96 - 112 mmol/L   CO2 26 19 - 32 mmol/L   Glucose, Bld 102 (H) 70 - 99 mg/dL   BUN 19 6 - 23 mg/dL   Creatinine, Ser 1.61 0.50 - 1.10 mg/dL   Calcium 9.3 8.4 - 09.6 mg/dL   Total Protein 7.2 6.0 - 8.3 g/dL   Albumin 3.9 3.5 - 5.2 g/dL   AST 14 0 - 37 U/L   ALT 11 0 - 35 U/L   Alkaline Phosphatase 88 39 - 117 U/L   Total Bilirubin 0.4 0.3 - 1.2 mg/dL   GFR calc non Af Amer >90 >90 mL/min   GFR calc Af Amer >90 >90 mL/min   Anion gap 7 5 - 15  Lipase, blood  Result Value Ref Range   Lipase 25 11 - 59 U/L  CBC with Differential  Result Value Ref Range   WBC 8.9 4.0 - 10.5 K/uL   RBC 4.49 3.87 - 5.11 MIL/uL    Hemoglobin 12.9 12.0 - 15.0 g/dL   HCT 04.5 40.9 - 81.1 %   MCV 87.3 78.0 - 100.0 fL   MCH 28.7 26.0 - 34.0 pg   MCHC 32.9 30.0 - 36.0 g/dL   RDW 91.4 78.2 - 95.6 %   Platelets 318 150 - 400 K/uL   Neutrophils Relative % 51 43 - 77 %   Neutro Abs 4.5 1.7 - 7.7 K/uL   Lymphocytes Relative 39 12 - 46 %   Lymphs Abs 3.5  0.7 - 4.0 K/uL   Monocytes Relative 7 3 - 12 %   Monocytes Absolute 0.6 0.1 - 1.0 K/uL   Eosinophils Relative 3 0 - 5 %   Eosinophils Absolute 0.3 0.0 - 0.7 K/uL   Basophils Relative 0 0 - 1 %   Basophils Absolute 0.0 0.0 - 0.1 K/uL   MDM   Final diagnoses:  LUQ abdominal pain   Abdominal pain of uncertain cause. Although she reports gallstones present, symptoms do not seem consistent with biliary colic. Will obtain records from her ED visit at [facility] and check screening labs. I do not see an indication for repeat CT scanning. Urinalysis does show evidence of urinary tract infection with many bacteria and 11-20 WBCs, but clinically, she does not have a UTI.  Laboratory workup is unremarkable. Attempt to obtain old records from [facility] yielded only laboratory reports from October 29 and there is no significant change in CBC or metabolic panel from that date until today. Patient appears to be in no distress whatsoever. She was talking on the phone on both occasions when I went into the room. I still did not see any indication for advanced imaging. Since she was not getting relief of pain with tramadol, will give her prescription for small number of oxycodone acetaminophen tablets and she is referred to gastroenterology for further evaluation.  Dione Booze, MD 07/25/14 0230

## 2014-11-15 ENCOUNTER — Emergency Department (HOSPITAL_BASED_OUTPATIENT_CLINIC_OR_DEPARTMENT_OTHER)
Admission: EM | Admit: 2014-11-15 | Discharge: 2014-11-15 | Disposition: A | Discharge status: Home / Self Care | Payer: Medicaid Other | Attending: Emergency Medicine | Admitting: Emergency Medicine

## 2014-11-15 ENCOUNTER — Encounter (HOSPITAL_BASED_OUTPATIENT_CLINIC_OR_DEPARTMENT_OTHER): Payer: Self-pay | Admitting: Emergency Medicine

## 2014-11-15 DIAGNOSIS — Z3202 Encounter for pregnancy test, result negative: Secondary | ICD-10-CM | POA: Insufficient documentation

## 2014-11-15 DIAGNOSIS — F209 Schizophrenia, unspecified: Secondary | ICD-10-CM | POA: Diagnosis not present

## 2014-11-15 DIAGNOSIS — F319 Bipolar disorder, unspecified: Secondary | ICD-10-CM | POA: Diagnosis not present

## 2014-11-15 DIAGNOSIS — Z79899 Other long term (current) drug therapy: Secondary | ICD-10-CM | POA: Insufficient documentation

## 2014-11-15 DIAGNOSIS — Z792 Long term (current) use of antibiotics: Secondary | ICD-10-CM | POA: Insufficient documentation

## 2014-11-15 DIAGNOSIS — N939 Abnormal uterine and vaginal bleeding, unspecified: Secondary | ICD-10-CM | POA: Diagnosis present

## 2014-11-15 DIAGNOSIS — J45909 Unspecified asthma, uncomplicated: Secondary | ICD-10-CM | POA: Diagnosis not present

## 2014-11-15 DIAGNOSIS — Z793 Long term (current) use of hormonal contraceptives: Secondary | ICD-10-CM | POA: Diagnosis not present

## 2014-11-15 DIAGNOSIS — Z720 Tobacco use: Secondary | ICD-10-CM | POA: Insufficient documentation

## 2014-11-15 DIAGNOSIS — Z8639 Personal history of other endocrine, nutritional and metabolic disease: Secondary | ICD-10-CM | POA: Diagnosis not present

## 2014-11-15 DIAGNOSIS — A5909 Other urogenital trichomoniasis: Secondary | ICD-10-CM | POA: Insufficient documentation

## 2014-11-15 LAB — WET PREP, GENITAL: YEAST WET PREP: NONE SEEN

## 2014-11-15 LAB — PREGNANCY, URINE: Preg Test, Ur: NEGATIVE

## 2014-11-15 MED ORDER — METRONIDAZOLE 500 MG PO TABS: 500.0000 mg | ORAL_TABLET | Freq: Two times a day (BID) | ORAL | Status: DC

## 2014-11-15 NOTE — ED Notes (Signed)
Pt reports being late on menstrual cycle, yet when it finally arrived on 10/07/14 , pt reported seeing large clots yet now not having any clots. Pt reports boating and pressure when needing to use the bathroom.

## 2014-11-15 NOTE — ED Provider Notes (Signed)
CSN: [id number]     Arrival date & time 11/15/14  1459 History  This chart was scribed for Rolan Bucco, MD by Marica Otter, ED Scribe. This patient was seen in room MH04/MH04 and the patient's care was started at 3:23 PM.   Chief Complaint  Patient presents with  . Vaginal Bleeding   The history is provided by the patient. No language interpreter was used.   PCP: [facility] HPI Comments: Gina Martin is a 24 y.o. female, with PMHx noted below, who presents to the Emergency Department complaining of vaginal spotting and associated abd pain. Pt started her menstrual cycle one week late on 11/06/14. Pt reports her menstrual flow was atypically heavy involving lots of coagulated blood; the bleeding has subsided and now pt is spotting only, however, the abd pain persists. Pt reports taking ibuprofen at home with some relief. Pt denies nausea, vomiting, vaginal discharge, fever, dysuria, any urinary Sx,   Past Medical History  Diagnosis Date  . Hyperlipidemia   . Asthma   . Bipolar 1 disorder   . Schizophrenia    Past Surgical History  Procedure Laterality Date  . Cesarean section     History reviewed. No pertinent family history. History  Substance Use Topics  . Smoking status: Current Every Day Smoker -- 0.50 packs/day    Types: Cigarettes  . Smokeless tobacco: Not on file  . Alcohol Use: No   OB History    No data available     Review of Systems  Constitutional: Negative for fever, chills, diaphoresis and fatigue.  HENT: Negative for congestion, rhinorrhea and sneezing.   Eyes: Negative.   Respiratory: Negative for cough, chest tightness and shortness of breath.   Cardiovascular: Negative for chest pain and leg swelling.  Gastrointestinal: Positive for abdominal pain. Negative for nausea, vomiting, diarrhea and blood in stool.  Genitourinary: Positive for vaginal bleeding. Negative for dysuria, frequency, hematuria, flank pain, vaginal discharge and  difficulty urinating.  Musculoskeletal: Negative for back pain and arthralgias.  Skin: Negative for rash.  Neurological: Negative for dizziness, speech difficulty, weakness, numbness and headaches.   Allergies  Review of patient's allergies indicates no known allergies.  Home Medications   Prior to Admission medications   Medication Sig Start Date End Date Taking? Authorizing Provider  amoxicillin (AMOXIL) 500 MG capsule Take 2 capsules (1,000 mg total) by mouth daily. 02/06/14   John Molpus, MD  amoxicillin (AMOXIL) 500 MG capsule Take 1 capsule (500 mg total) by mouth 3 (three) times daily. 02/13/14   Geoffery Lyons, MD  fluconazole (DIFLUCAN) 150 MG tablet Take one capsule as needed for vaginal yeast infection. May repeat in 3 days if needed. 02/06/14   John Molpus, MD  fluconazole (DIFLUCAN) 150 MG tablet Take 1 tablet (150 mg total) by mouth daily. 02/13/14   Geoffery Lyons, MD  ibuprofen (ADVIL,MOTRIN) 800 MG tablet Take 1 tablet (800 mg total) by mouth every 8 (eight) hours as needed (for pain). 02/06/14   John Molpus, MD  lurasidone (LATUDA) 80 MG TABS tablet Take 80 mg by mouth daily with breakfast.    Historical Provider, MD  metroNIDAZOLE (FLAGYL) 500 MG tablet Take 1 tablet (500 mg total) by mouth 2 (two) times daily. One po bid x 7 days 11/15/14   Rolan Bucco, MD  norgestrel-ethinyl estradiol (LO/OVRAL,CRYSELLE) 0.3-30 MG-MCG tablet Take 1 tablet by mouth daily. 12/20/13   Loren Racer, MD  ondansetron (ZOFRAN) 4 MG tablet Take 1 tablet (4 mg total) by  mouth every 6 (six) hours. 07/25/14   Dione Boozeavid Glick, MD  oxyCODONE-acetaminophen (PERCOCET) 5-325 MG per tablet Take 1 tablet by mouth every 4 (four) hours as needed for moderate pain. 07/25/14   Dione Boozeavid Glick, MD  traMADol (ULTRAM) 50 MG tablet Take 50 mg by mouth every 6 (six) hours as needed.    Historical Provider, MD   Triage Vitals: BP 124/67 mmHg  Pulse 83  Temp(Src) 98.7 F (37.1 C) (Oral)  Resp 16  Ht 5\' 11"  (1.803 m)  Wt 234  lb (106.142 kg)  BMI 32.65 kg/m2  SpO2 100%  LMP 11/15/2014 Physical Exam  Constitutional: She is oriented to person, place, and time. She appears well-developed and well-nourished.  HENT:  Head: Normocephalic and atraumatic.  Eyes: Pupils are equal, round, and reactive to light.  Neck: Normal range of motion. Neck supple.  Cardiovascular: Normal rate, regular rhythm and normal heart sounds.   Pulmonary/Chest: Effort normal and breath sounds normal. No respiratory distress. She has no wheezes. She has no rales. She exhibits no tenderness.  Abdominal: Soft. Bowel sounds are normal. There is tenderness (lower abd). There is no rebound and no guarding.  Genitourinary:  Exam performed by Rolan BuccoMelanie Jary Louvier, MD,  exam chaperoned Date: 11/15/2014 Pelvic exam: normal external genitalia without evidence of trauma. VULVA: normal appearing vulva with no masses, tenderness or lesion. VAGINA: normal appearing vagina with normal color and discharge, no lesions. CERVIX: normal appearing cervix without lesions, cervical motion tenderness absent, cervical os closed with out purulent discharge; vaginal discharge+ creamy, Wet prep and DNA probe for chlamydia and GC obtained.   ADNEXA: normal adnexa in size, nontender and no masses UTERUS: uterus is normal size, shape, consistency and nontender.    Musculoskeletal: Normal range of motion. She exhibits no edema.  Lymphadenopathy:    She has no cervical adenopathy.  Neurological: She is alert and oriented to person, place, and time.  Skin: Skin is warm and dry. No rash noted.  Psychiatric: She has a normal mood and affect.    ED Course  Procedures (including critical care time) DIAGNOSTIC STUDIES: Oxygen Saturation is 100% on RA, nl by my interpretation.    COORDINATION OF CARE: 3:27 PM-Discussed treatment plan which includes pelvic exam, UA, pregnancy test with pt at bedside and pt agreed to plan.   Labs Review Results for orders placed or performed  during the hospital encounter of 11/15/14  Wet prep, genital  Result Value Ref Range   Yeast Wet Prep HPF POC NONE SEEN NONE SEEN   Trich, Wet Prep MODERATE (A) NONE SEEN   Clue Cells Wet Prep HPF POC FEW (A) NONE SEEN   WBC, Wet Prep HPF POC TOO NUMEROUS TO COUNT (A) NONE SEEN  Pregnancy, urine  Result Value Ref Range   Preg Test, Ur NEGATIVE NEGATIVE   No results found.    Imaging Review No results found.   EKG Interpretation None      MDM   Final diagnoses:  Trichomonal cervicitis    PT +for trich.  preg neg.  abd not significantly tender, no bleeding.  Will tx with flagyl.  Pt instructed to have sexual partners treated.    I personally performed the services described in this documentation, which was scribed in my presence.  The recorded information has been reviewed and considered.    Rolan BuccoMelanie Britnee Mcdevitt, MD 11/15/14 [phone]

## 2014-11-15 NOTE — Discharge Instructions (Signed)

## 2014-11-15 NOTE — ED Notes (Signed)
Patient reports that her Menstrual period is later than usual and heavier than usual.

## 2014-11-17 LAB — GC/CHLAMYDIA PROBE AMP (~~LOC~~) NOT AT ARMC
CHLAMYDIA, DNA PROBE: NEGATIVE
Neisseria Gonorrhea: POSITIVE — AB

## 2014-11-17 LAB — HIV ANTIBODY (ROUTINE TESTING W REFLEX): HIV SCREEN 4TH GENERATION: NONREACTIVE

## 2014-11-17 LAB — RPR: RPR: NONREACTIVE

## 2016-09-03 ENCOUNTER — Encounter (HOSPITAL_BASED_OUTPATIENT_CLINIC_OR_DEPARTMENT_OTHER): Payer: Self-pay | Admitting: *Deleted

## 2016-09-03 ENCOUNTER — Emergency Department (HOSPITAL_BASED_OUTPATIENT_CLINIC_OR_DEPARTMENT_OTHER)
Admission: EM | Admit: 2016-09-03 | Discharge: 2016-09-03 | Disposition: A | Discharge status: Home / Self Care | Payer: Medicaid Other | Attending: Emergency Medicine | Admitting: Emergency Medicine

## 2016-09-03 DIAGNOSIS — R1032 Left lower quadrant pain: Secondary | ICD-10-CM | POA: Insufficient documentation

## 2016-09-03 DIAGNOSIS — F17210 Nicotine dependence, cigarettes, uncomplicated: Secondary | ICD-10-CM | POA: Insufficient documentation

## 2016-09-03 DIAGNOSIS — Z79899 Other long term (current) drug therapy: Secondary | ICD-10-CM | POA: Insufficient documentation

## 2016-09-03 DIAGNOSIS — J45909 Unspecified asthma, uncomplicated: Secondary | ICD-10-CM | POA: Insufficient documentation

## 2016-09-03 DIAGNOSIS — R300 Dysuria: Secondary | ICD-10-CM | POA: Insufficient documentation

## 2016-09-03 DIAGNOSIS — R1012 Left upper quadrant pain: Secondary | ICD-10-CM | POA: Diagnosis not present

## 2016-09-03 DIAGNOSIS — R197 Diarrhea, unspecified: Secondary | ICD-10-CM | POA: Diagnosis not present

## 2016-09-03 LAB — URINALYSIS, ROUTINE W REFLEX MICROSCOPIC
Bilirubin Urine: NEGATIVE
Glucose, UA: NEGATIVE mg/dL
Hgb urine dipstick: NEGATIVE
Ketones, ur: NEGATIVE mg/dL
Leukocytes, UA: NEGATIVE
Nitrite: NEGATIVE
Protein, ur: NEGATIVE mg/dL
Specific Gravity, Urine: 1.027 (ref 1.005–1.030)
pH: 6 (ref 5.0–8.0)

## 2016-09-03 LAB — PREGNANCY, URINE: Preg Test, Ur: NEGATIVE

## 2016-09-03 MED ORDER — DICYCLOMINE HCL 20 MG PO TABS: 20.0000 mg | ORAL_TABLET | Freq: Four times a day (QID) | ORAL | 0 refills | Status: DC | PRN

## 2016-09-03 MED ORDER — LOPERAMIDE HCL 2 MG PO CAPS
4.0000 mg | ORAL_CAPSULE | Freq: Once | ORAL | Status: AC
  Administered 2016-09-03: 4 mg via ORAL

## 2016-09-03 MED ORDER — LOPERAMIDE HCL 2 MG PO CAPS
ORAL_CAPSULE | ORAL | Status: DC
Start: 2016-09-03 — End: 2016-09-04
  Filled 2016-09-03: qty 2

## 2016-09-03 MED ORDER — CIPROFLOXACIN HCL 500 MG PO TABS: 500.0000 mg | ORAL_TABLET | Freq: Two times a day (BID) | ORAL | 0 refills | Status: DC

## 2016-09-03 NOTE — ED Provider Notes (Signed)
[facility]   CSN: [id number] Arrival date & time: 09/03/16  2006   By signing my name below, I, [name], attest that this documentation has been prepared under the direction and in the presence of Raeford Razor, MD. Electronically signed, [name], ED Scribe. 09/03/16. 10:28 PM.   History   Chief Complaint Chief Complaint  Patient presents with  . Diarrhea   The history is provided by the patient and medical records. No language interpreter was used.  Diarrhea   This is a new problem. The current episode started more than 2 days ago. The problem occurs 5 to 10 times per day. The problem has been gradually worsening. The stool consistency is described as watery. There has been no fever. Associated symptoms include abdominal pain. Pertinent negatives include no vomiting, no chills, no arthralgias and no cough. She has tried increased fluid intake and a change of diet for the symptoms.    Gina Martin is a 26 y.o. female who presents to the Emergency Department with concern for gradually worsening diarrhea x 1 week. Pt states her stools began chunky and are now watery. Constant LUQ pain today, decreased appetite, dysuria noted. Pt reportedly unable to keep down fluids or liquids. She reports 10 bowel movements today. She states she has attempted to eat binding foods without relief. No other treatments tried. No other modifying factors noted. No sick contacts, fever, recent abx use or any other complaints noted at this time.   Past Medical History:  Diagnosis Date  . Asthma   . Bipolar 1 disorder (HCC)   . Hyperlipidemia   . Schizophrenia (HCC)     There are no active problems to display for this patient.   Past Surgical History:  Procedure Laterality Date  . CESAREAN SECTION      OB History    No data available       Home Medications    Prior to Admission medications   Medication Sig Start Date End Date Taking? Authorizing  Provider  albuterol (PROVENTIL HFA;VENTOLIN HFA) 108 (90 Base) MCG/ACT inhaler Inhale 2 puffs into the lungs every 6 (six) hours as needed for wheezing or shortness of breath.   Yes [provider]  cetirizine (ZYRTEC) 10 MG tablet Take 10 mg by mouth daily.   Yes [provider]  amoxicillin (AMOXIL) 500 MG capsule Take 2 capsules (1,000 mg total) by mouth daily. 02/06/14   Molpus, John, MD  amoxicillin (AMOXIL) 500 MG capsule Take 1 capsule (500 mg total) by mouth 3 (three) times daily. 02/13/14   Geoffery Lyons, MD  fluconazole (DIFLUCAN) 150 MG tablet Take one capsule as needed for vaginal yeast infection. May repeat in 3 days if needed. 02/06/14   Molpus, John, MD  fluconazole (DIFLUCAN) 150 MG tablet Take 1 tablet (150 mg total) by mouth daily. 02/13/14   Geoffery Lyons, MD  ibuprofen (ADVIL,MOTRIN) 800 MG tablet Take 1 tablet (800 mg total) by mouth every 8 (eight) hours as needed (for pain). 02/06/14   Molpus, John, MD  lurasidone (LATUDA) 80 MG TABS tablet Take 80 mg by mouth daily with breakfast.    [provider]  metroNIDAZOLE (FLAGYL) 500 MG tablet Take 1 tablet (500 mg total) by mouth 2 (two) times daily. One po bid x 7 days 11/15/14   Rolan Bucco, MD  norgestrel-ethinyl estradiol (LO/OVRAL,CRYSELLE) 0.3-30 MG-MCG tablet Take 1 tablet by mouth daily. 12/20/13   Loren Racer, MD  ondansetron (ZOFRAN) 4 MG tablet Take  1 tablet (4 mg total) by mouth every 6 (six) hours. 07/25/14   Dione BoozeGlick, David, MD  oxyCODONE-acetaminophen (PERCOCET) 5-325 MG per tablet Take 1 tablet by mouth every 4 (four) hours as needed for moderate pain. 07/25/14   Dione BoozeGlick, David, MD  traMADol (ULTRAM) 50 MG tablet Take 50 mg by mouth every 6 (six) hours as needed.    [provider]    Family History History reviewed. No pertinent family history.  Social History Social History  Substance Use Topics  . Smoking status: Current Every Day Smoker    Packs/day: 0.50    Types:  Cigarettes  . Smokeless tobacco: Never Used  . Alcohol use No     Allergies   Patient has no known allergies.   Review of Systems Review of Systems  Constitutional: Positive for appetite change. Negative for chills.  Respiratory: Negative for cough.   Gastrointestinal: Positive for abdominal pain and diarrhea. Negative for vomiting.  Genitourinary: Positive for dysuria.  Musculoskeletal: Negative for arthralgias.  All other systems reviewed and are negative.    Physical Exam Updated Vital Signs BP 125/79 (BP Location: Right Arm)   Pulse 92   Temp 99.3 F (37.4 C) (Oral)   Resp 20   Ht 5\' 11"  (1.803 m)   Wt 236 lb (107 kg)   LMP 08/11/2016 (Exact Date)   SpO2 100%   BMI 32.92 kg/m   Physical Exam  Constitutional: She is oriented to person, place, and time. She appears well-developed and well-nourished.  HENT:  Head: Normocephalic.  Eyes: EOM are normal.  Neck: Normal range of motion.  Pulmonary/Chest: Effort normal.  Abdominal: She exhibits no distension. There is tenderness (mild) in the left upper quadrant and left lower quadrant. There is no rebound and no guarding.  Musculoskeletal: Normal range of motion.  Neurological: She is alert and oriented to person, place, and time.  Psychiatric: She has a normal mood and affect.  Nursing note and vitals reviewed.    ED Treatments / Results  DIAGNOSTIC STUDIES: Oxygen Saturation is 100% on RA, NL by my interpretation.    COORDINATION OF CARE: 10:27 PM-Discussed next steps with pt. Pt verbalized understanding and is agreeable with the plan. Will order medications. Pt prepared for d/c, advised of symptomatic care at home and return precautions.    Labs (all labs ordered are listed, but only abnormal results are displayed) Labs Reviewed  URINALYSIS, ROUTINE W REFLEX MICROSCOPIC - Abnormal; Notable for the following:       Result Value   APPearance CLOUDY (*)    All other components within normal limits    PREGNANCY, URINE    EKG  EKG Interpretation None       Radiology No results found.   Procedures Procedures (including critical care time)  Medications Ordered in ED Medications - No data to display   Initial Impression / Assessment and Plan / ED Course  I have reviewed the triage vital signs and the nursing notes.  Pertinent labs & imaging results that were available during my care of the patient were reviewed by me and considered in my medical decision making (see chart for details).     26yF with diarrhea. HD stable. Benign abdomen.   Final Clinical Impressions(s) / ED Diagnoses   Final diagnoses:  Diarrhea, unspecified type    New Prescriptions  I personally preformed the services scribed in my presence. The recorded information has been reviewed is accurate. Raeford RazorStephen Blaze Sandin, MD.    Raeford RazorKohut, Angelina Neece, MD  09/11/16 1421  

## 2016-09-03 NOTE — ED Triage Notes (Signed)
Pt describes diarrhea x 1 week. LUQ pain onset today. 10 loose stools today. Denies other s/s.

## 2016-09-21 LAB — GLUCOSE, POCT (MANUAL RESULT ENTRY): POC Glucose: 93 mg/dl (ref 70–99)

## 2016-12-29 ENCOUNTER — Emergency Department (HOSPITAL_BASED_OUTPATIENT_CLINIC_OR_DEPARTMENT_OTHER): Payer: Medicaid Other

## 2016-12-29 ENCOUNTER — Encounter (HOSPITAL_BASED_OUTPATIENT_CLINIC_OR_DEPARTMENT_OTHER): Payer: Self-pay

## 2016-12-29 ENCOUNTER — Emergency Department (HOSPITAL_BASED_OUTPATIENT_CLINIC_OR_DEPARTMENT_OTHER)
Admission: EM | Admit: 2016-12-29 | Discharge: 2016-12-29 | Disposition: A | Discharge status: Home / Self Care | Payer: Medicaid Other | Attending: Emergency Medicine | Admitting: Emergency Medicine

## 2016-12-29 DIAGNOSIS — R079 Chest pain, unspecified: Secondary | ICD-10-CM | POA: Insufficient documentation

## 2016-12-29 DIAGNOSIS — F17210 Nicotine dependence, cigarettes, uncomplicated: Secondary | ICD-10-CM | POA: Insufficient documentation

## 2016-12-29 DIAGNOSIS — Z79899 Other long term (current) drug therapy: Secondary | ICD-10-CM | POA: Diagnosis not present

## 2016-12-29 DIAGNOSIS — J45909 Unspecified asthma, uncomplicated: Secondary | ICD-10-CM | POA: Insufficient documentation

## 2016-12-29 DIAGNOSIS — O30109 Triplet pregnancy, unspecified number of placenta and unspecified number of amniotic sacs, unspecified trimester: Secondary | ICD-10-CM | POA: Diagnosis not present

## 2016-12-29 DIAGNOSIS — O99330 Smoking (tobacco) complicating pregnancy, unspecified trimester: Secondary | ICD-10-CM | POA: Diagnosis not present

## 2016-12-29 DIAGNOSIS — O99519 Diseases of the respiratory system complicating pregnancy, unspecified trimester: Secondary | ICD-10-CM | POA: Diagnosis not present

## 2016-12-29 DIAGNOSIS — R0789 Other chest pain: Secondary | ICD-10-CM

## 2016-12-29 DIAGNOSIS — O9989 Other specified diseases and conditions complicating pregnancy, childbirth and the puerperium: Secondary | ICD-10-CM | POA: Diagnosis not present

## 2016-12-29 DIAGNOSIS — Z7982 Long term (current) use of aspirin: Secondary | ICD-10-CM | POA: Diagnosis not present

## 2016-12-29 LAB — CBC
HEMATOCRIT: 35.1 % — AB (ref 36.0–46.0)
Hemoglobin: 11.9 g/dL — ABNORMAL LOW (ref 12.0–15.0)
MCH: 29.8 pg (ref 26.0–34.0)
MCHC: 33.9 g/dL (ref 30.0–36.0)
MCV: 87.8 fL (ref 78.0–100.0)
Platelets: 255 10*3/uL (ref 150–400)
RBC: 4 MIL/uL (ref 3.87–5.11)
RDW: 13.3 % (ref 11.5–15.5)
WBC: 13.8 10*3/uL — AB (ref 4.0–10.5)

## 2016-12-29 LAB — TROPONIN I

## 2016-12-29 LAB — BASIC METABOLIC PANEL
ANION GAP: 8 (ref 5–15)
BUN: 9 mg/dL (ref 6–20)
CALCIUM: 9.4 mg/dL (ref 8.9–10.3)
CO2: 22 mmol/L (ref 22–32)
Chloride: 103 mmol/L (ref 101–111)
Creatinine, Ser: 0.48 mg/dL (ref 0.44–1.00)
GLUCOSE: 92 mg/dL (ref 65–99)
POTASSIUM: 3.6 mmol/L (ref 3.5–5.1)
Sodium: 133 mmol/L — ABNORMAL LOW (ref 135–145)

## 2016-12-29 LAB — D-DIMER, QUANTITATIVE (NOT AT ARMC): D-Dimer, Quant: 0.4 ug/mL-FEU (ref 0.00–0.50)

## 2016-12-29 MED ORDER — ACETAMINOPHEN 500 MG PO TABS
1000.0000 mg | ORAL_TABLET | Freq: Once | ORAL | Status: AC
Start: 2016-12-29 — End: 2016-12-29
  Administered 2016-12-29: 1000 mg via ORAL
  Filled 2016-12-29: qty 2

## 2016-12-29 MED ORDER — SODIUM CHLORIDE 0.9 % IV BOLUS (SEPSIS)
1000.0000 mL | Freq: Once | INTRAVENOUS | Status: AC
  Administered 2016-12-29: 1000 mL via INTRAVENOUS

## 2016-12-29 MED ORDER — ONDANSETRON HCL 4 MG/2ML IJ SOLN
4.0000 mg | Freq: Once | INTRAMUSCULAR | Status: AC
  Administered 2016-12-29: 4 mg via INTRAVENOUS
  Filled 2016-12-29: qty 2

## 2016-12-29 NOTE — ED Triage Notes (Signed)
C/o CP x 4 hours-NAD-steady gait[facility]-15 weeks with triplets

## 2016-12-29 NOTE — Discharge Instructions (Signed)
Return here as needed.  Follow-up with your doctor.  Your testing here tonight did not show any abnormalities.  Take Tylenol for her pain use Robitussin for her cough

## 2016-12-29 NOTE — ED Provider Notes (Signed)
[facility]   CSN: [id number] Arrival date & time: 12/29/16  1827     History   Chief Complaint Chief Complaint  Patient presents with  . Chest Pain    HPI Gina Martin is a 26 y.o. female.  HPI Patient presents to the emergency department with chest pain over the last 5 hours.  The patient states that she is pregnant with triplets.  She states that she is having discomfort diffusely throughout her chest.  She states that she did not take any medications prior to arrival.  She states nothing seems make her condition better or worseThe patient denies  shortness of breath, headache,blurred vision, neck pain, fever, cough, weakness, numbness, dizziness, anorexia, edema, abdominal pain, nausea, vomiting, diarrhea, rash, back pain, dysuria, hematemesis, bloody stool, near syncope, or syncope. Past Medical History:  Diagnosis Date  . Asthma   . Bipolar 1 disorder (HCC)   . Hyperlipidemia   . Schizophrenia (HCC)     There are no active problems to display for this patient.   Past Surgical History:  Procedure Laterality Date  . CESAREAN SECTION      OB History    Gravida Para Term Preterm AB Living   3 1           SAB TAB Ectopic Multiple Live Births                   Home Medications    Prior to Admission medications   Medication Sig Start Date End Date Taking? Authorizing Provider  aspirin 81 MG chewable tablet Chew by mouth daily.   Yes [provider]  IRON PO Take by mouth.   Yes [provider]  Prenatal Vit-Fe Fumarate-FA (PRENATAL MULTIVITAMIN) TABS tablet Take 1 tablet by mouth daily at 12 noon.   Yes [provider]  Sulfamethoxazole-Trimethoprim (SEPTRA PO) Take by mouth.   Yes [provider]  albuterol (PROVENTIL HFA;VENTOLIN HFA) 108 (90 Base) MCG/ACT inhaler Inhale 2 puffs into the lungs every 6 (six) hours as needed for wheezing or shortness of breath.    [provider]     Family History No family history on file.  Social History Social History  Substance Use Topics  . Smoking status: Current Every Day Smoker    Packs/day: 0.50    Types: Cigarettes  . Smokeless tobacco: Never Used  . Alcohol use No     Allergies   Patient has no known allergies.   Review of Systems Review of Systems All other systems negative except as documented in the HPI. All pertinent positives and negatives as reviewed in the HPI.   Physical Exam Updated Vital Signs BP 136/65 (BP Location: Right Arm)   Pulse (!) 114   Temp 98.5 F (36.9 C) (Oral)   Resp 18   Ht 5\' 11"  (1.803 m)   Wt 108.4 kg (239 lb)   LMP 08/11/2016 (Exact Date)   SpO2 100%   BMI 33.33 kg/m   Physical Exam  Constitutional: She is oriented to person, place, and time. She appears well-developed and well-nourished. No distress.  HENT:  Head: Normocephalic and atraumatic.  Mouth/Throat: Oropharynx is clear and moist.  Eyes: Pupils are equal, round, and reactive to light.  Neck: Normal range of motion. Neck supple.  Cardiovascular: Normal rate, regular rhythm and normal heart sounds.  Exam reveals no gallop and no friction rub.   No murmur heard. Pulmonary/Chest: Effort normal and breath sounds normal. No respiratory distress.  She has no wheezes.  Abdominal: Soft. Bowel sounds are normal. She exhibits no distension. There is no tenderness.  Neurological: She is alert and oriented to person, place, and time. She exhibits normal muscle tone. Coordination normal.  Skin: Skin is warm and dry. Capillary refill takes less than 2 seconds. No rash noted. No erythema.  Psychiatric: She has a normal mood and affect. Her behavior is normal.  Nursing note and vitals reviewed.    ED Treatments / Results  Labs (all labs ordered are listed, but only abnormal results are displayed) Labs Reviewed  BASIC METABOLIC PANEL - Abnormal; Notable for the following:       Result Value   Sodium 133 (*)    All  other components within normal limits  CBC - Abnormal; Notable for the following:    WBC 13.8 (*)    Hemoglobin 11.9 (*)    HCT 35.1 (*)    All other components within normal limits  TROPONIN I    EKG  EKG Interpretation None       Radiology No results found.  Procedures Procedures (including critical care time)  Medications Ordered in ED Medications  sodium chloride 0.9 % bolus 1,000 mL (1,000 mLs Intravenous New Bag/Given 12/29/16 2002)  ondansetron [facility]) injection 4 mg (4 mg Intravenous Given 12/29/16 2002)  acetaminophen (TYLENOL) tablet 1,000 mg (1,000 mg Oral Given 12/29/16 2002)     Initial Impression / Assessment and Plan / ED Course  I have reviewed the triage vital signs and the nursing notes.  Pertinent labs & imaging results that were available during my care of the patient were reviewed by me and considered in my medical decision making (see chart for details).     Patient has a negative d-dimer, along with negative cardiac enzymes are EKG does not show any significant abnormalities.  The patient is pregnant.  Therefore, we did not explore any further imaging options at this point.  Patient is feeling better and would like to be discharged home.  Did advise her to follow-up with her OB doctor.  Patient agrees the plan.  All questions were answered  Final Clinical Impressions(s) / ED Diagnoses   Final diagnoses:  None    New Prescriptions New Prescriptions   No medications on file     Charlestine Night, PA-C 01/03/17 0548    Charlestine Night, PA-C 01/03/17 1610    Marily Memos, MD 01/03/17 [phone]

## 2017-06-25 ENCOUNTER — Ambulatory Visit (HOSPITAL_BASED_OUTPATIENT_CLINIC_OR_DEPARTMENT_OTHER)
Admit: 2017-06-25 | Discharge: 2017-06-25 | Disposition: A | Discharge status: Home / Self Care | Payer: Medicaid Other | Attending: Emergency Medicine | Admitting: Emergency Medicine

## 2017-06-25 ENCOUNTER — Encounter (HOSPITAL_BASED_OUTPATIENT_CLINIC_OR_DEPARTMENT_OTHER): Payer: Self-pay | Admitting: Emergency Medicine

## 2017-06-25 ENCOUNTER — Emergency Department (HOSPITAL_BASED_OUTPATIENT_CLINIC_OR_DEPARTMENT_OTHER)
Admission: EM | Admit: 2017-06-25 | Discharge: 2017-06-25 | Disposition: A | Discharge status: Home / Self Care | Payer: Medicaid Other | Attending: Emergency Medicine | Admitting: Emergency Medicine

## 2017-06-25 ENCOUNTER — Other Ambulatory Visit: Payer: Self-pay

## 2017-06-25 DIAGNOSIS — N939 Abnormal uterine and vaginal bleeding, unspecified: Secondary | ICD-10-CM | POA: Diagnosis present

## 2017-06-25 DIAGNOSIS — O9089 Other complications of the puerperium, not elsewhere classified: Secondary | ICD-10-CM | POA: Diagnosis present

## 2017-06-25 DIAGNOSIS — J45909 Unspecified asthma, uncomplicated: Secondary | ICD-10-CM | POA: Insufficient documentation

## 2017-06-25 DIAGNOSIS — N938 Other specified abnormal uterine and vaginal bleeding: Secondary | ICD-10-CM | POA: Insufficient documentation

## 2017-06-25 DIAGNOSIS — R102 Pelvic and perineal pain: Secondary | ICD-10-CM | POA: Diagnosis not present

## 2017-06-25 DIAGNOSIS — F17210 Nicotine dependence, cigarettes, uncomplicated: Secondary | ICD-10-CM | POA: Insufficient documentation

## 2017-06-25 DIAGNOSIS — Z9889 Other specified postprocedural states: Secondary | ICD-10-CM | POA: Insufficient documentation

## 2017-06-25 DIAGNOSIS — Z7982 Long term (current) use of aspirin: Secondary | ICD-10-CM | POA: Insufficient documentation

## 2017-06-25 DIAGNOSIS — N83202 Unspecified ovarian cyst, left side: Secondary | ICD-10-CM | POA: Diagnosis not present

## 2017-06-25 DIAGNOSIS — Z79899 Other long term (current) drug therapy: Secondary | ICD-10-CM | POA: Diagnosis not present

## 2017-06-25 LAB — URINALYSIS, ROUTINE W REFLEX MICROSCOPIC
Bilirubin Urine: NEGATIVE
Glucose, UA: NEGATIVE mg/dL
KETONES UR: NEGATIVE mg/dL
Leukocytes, UA: NEGATIVE
Nitrite: NEGATIVE
Protein, ur: NEGATIVE mg/dL
Specific Gravity, Urine: 1.02 (ref 1.005–1.030)
pH: 6 (ref 5.0–8.0)

## 2017-06-25 LAB — CBC WITH DIFFERENTIAL/PLATELET
Basophils Absolute: 0 10*3/uL (ref 0.0–0.1)
Basophils Relative: 0 %
Eosinophils Absolute: 0.2 10*3/uL (ref 0.0–0.7)
Eosinophils Relative: 2 %
HCT: 36.1 % (ref 36.0–46.0)
Hemoglobin: 11.9 g/dL — ABNORMAL LOW (ref 12.0–15.0)
Lymphocytes Relative: 35 %
Lymphs Abs: 3.4 10*3/uL (ref 0.7–4.0)
MCH: 28.7 pg (ref 26.0–34.0)
MCHC: 33 g/dL (ref 30.0–36.0)
MCV: 87.2 fL (ref 78.0–100.0)
Monocytes Absolute: 0.7 10*3/uL (ref 0.1–1.0)
Monocytes Relative: 7 %
Neutro Abs: 5.3 10*3/uL (ref 1.7–7.7)
Neutrophils Relative %: 56 %
Platelets: 294 10*3/uL (ref 150–400)
RBC: 4.14 MIL/uL (ref 3.87–5.11)
RDW: 13 % (ref 11.5–15.5)
WBC: 9.7 10*3/uL (ref 4.0–10.5)

## 2017-06-25 LAB — BASIC METABOLIC PANEL
Anion gap: 8 (ref 5–15)
BUN: 17 mg/dL (ref 6–20)
CALCIUM: 9 mg/dL (ref 8.9–10.3)
CO2: 22 mmol/L (ref 22–32)
CREATININE: 1.02 mg/dL — AB (ref 0.44–1.00)
Chloride: 109 mmol/L (ref 101–111)
GFR calc non Af Amer: >60 mL/min (ref >60)
Glucose, Bld: 97 mg/dL (ref 65–99)
Potassium: 3.7 mmol/L (ref 3.5–5.1)
SODIUM: 139 mmol/L (ref 135–145)

## 2017-06-25 LAB — URINALYSIS, MICROSCOPIC (REFLEX): Bacteria, UA: NONE SEEN

## 2017-06-25 LAB — WET PREP, GENITAL
Clue Cells Wet Prep HPF POC: NONE SEEN
Sperm: NONE SEEN
TRICH WET PREP: NONE SEEN
Yeast Wet Prep HPF POC: NONE SEEN

## 2017-06-25 LAB — PREGNANCY, URINE: PREG TEST UR: NEGATIVE

## 2017-06-25 NOTE — ED Provider Notes (Signed)
[facility] Provider Note: [name], MD, FACEP  CSN: [id number] MRN: [id number] ARRIVAL: 06/25/17 at 0001 ROOM: MH04/MH04   CHIEF COMPLAINT  Vaginal Bleeding   HISTORY OF PRESENT ILLNESS  06/25/17 6:12 AM Gina Martin is a 27 y.o. female who had triplets by cesarean section on January 15.  She states she has had persistent vaginal bleeding since being discharged from the hospital.  She states the bleeding worsened yesterday and she was passing clots.  She alleges she has used 22 pads ("the small ones") since yesterday.  She states this was associated with pelvic cramping which she rated as an 8 out of 10 on arrival, worse with movement or palpation.  She called her OB/GYN at [facility] yesterday at 12:35 PM.  She advised them of her heavy vaginal bleeding and she was told to go to the ED.  She did not check in here until about midnight.    Past Medical History:  Diagnosis Date  . Asthma   . Bipolar 1 disorder (HCC)   . Hyperlipidemia   . Schizophrenia [facility])     Past Surgical History:  Procedure Laterality Date  . CESAREAN SECTION      No family history on file.  Social History   Tobacco Use  . Smoking status: Current Every Day Smoker    Packs/day: 0.50    Types: Cigarettes  . Smokeless tobacco: Never Used  Substance Use Topics  . Alcohol use: No  . Drug use: No    Prior to Admission medications   Medication Sig Start Date End Date Taking? Authorizing Provider  albuterol (PROVENTIL HFA;VENTOLIN HFA) 108 (90 Base) MCG/ACT inhaler Inhale 2 puffs into the lungs every 6 (six) hours as needed for wheezing or shortness of breath.    [provider]  aspirin 81 MG chewable tablet Chew by mouth daily.    [provider]  IRON PO Take by mouth.    [provider]  Prenatal Vit-Fe Fumarate-FA (PRENATAL MULTIVITAMIN) TABS tablet Take 1 tablet by mouth daily at 12 noon.    [provider]  Sulfamethoxazole-Trimethoprim  (SEPTRA PO) Take by mouth.    [provider]    Allergies Patient has no known allergies.   REVIEW OF SYSTEMS  Negative except as noted here or in the History of Present Illness.   PHYSICAL EXAMINATION  Initial Vital Signs Blood pressure 115/72, pulse (!) 57, temperature 98.9 F (37.2 C), temperature source Oral, resp. rate 16, height 5\' 11"  (1.803 m), weight 106.3 kg (234 lb 6.4 oz), last menstrual period 08/11/2016, SpO2 98 %, unknown if currently breastfeeding.  Examination General: Well-developed, well-nourished female in no acute distress; appearance consistent with age of record HENT: normocephalic; atraumatic Eyes: pupils equal, round and reactive to light; extraocular muscles intact Neck: supple Heart: regular rate and rhythm Lungs: clear to auscultation bilaterally Abdomen: soft; nondistended; mild suprapubic tenderness; well-healed cesarean incision without erythema, warmth or drainage; bowel sounds present GU: Normal external genitalia; vaginal bleeding; minimal cervical motion tenderness; minimal right adnexal tenderness Extremities: No deformity; full range of motion; pulses normal Neurologic: Sleeping but readily awakened; motor function intact in all extremities and symmetric; no facial droop Skin: Warm and dry Psychiatric: Angry mood with congruent affect   RESULTS  Summary of this visit's results, reviewed by myself:   EKG Interpretation  Date/Time:    Ventricular Rate:    PR Interval:    QRS Duration:   QT Interval:    QTC Calculation:  R Axis:     Text Interpretation:        Laboratory Studies: Results for orders placed or performed during the hospital encounter of 06/25/17 (from the past 24 hour(s))  CBC with Differential     Status: Abnormal   Collection Time: 06/25/17 12:29 AM  Result Value Ref Range   WBC 9.7 4.0 - 10.5 K/uL   RBC 4.14 3.87 - 5.11 MIL/uL   Hemoglobin 11.9 (L) 12.0 - 15.0 g/dL   HCT 16.1 09.6 - 04.5 %   MCV 87.2  78.0 - 100.0 fL   MCH 28.7 26.0 - 34.0 pg   MCHC 33.0 30.0 - 36.0 g/dL   RDW 40.9 81.1 - 91.4 %   Platelets 294 150 - 400 K/uL   Neutrophils Relative % 56 %   Neutro Abs 5.3 1.7 - 7.7 K/uL   Lymphocytes Relative 35 %   Lymphs Abs 3.4 0.7 - 4.0 K/uL   Monocytes Relative 7 %   Monocytes Absolute 0.7 0.1 - 1.0 K/uL   Eosinophils Relative 2 %   Eosinophils Absolute 0.2 0.0 - 0.7 K/uL   Basophils Relative 0 %   Basophils Absolute 0.0 0.0 - 0.1 K/uL  Basic metabolic panel     Status: Abnormal   Collection Time: 06/25/17 12:29 AM  Result Value Ref Range   Sodium 139 135 - 145 mmol/L   Potassium 3.7 3.5 - 5.1 mmol/L   Chloride 109 101 - 111 mmol/L   CO2 22 22 - 32 mmol/L   Glucose, Bld 97 65 - 99 mg/dL   BUN 17 6 - 20 mg/dL   Creatinine, Ser 7.82 (H) 0.44 - 1.00 mg/dL   Calcium 9.0 8.9 - 95.6 mg/dL   GFR calc non Af Amer >60 >60 mL/min   GFR calc Af Amer >60 >60 mL/min   Anion gap 8 5 - 15  Urinalysis, Routine w reflex microscopic     Status: Abnormal   Collection Time: 06/25/17  4:40 AM  Result Value Ref Range   Color, Urine YELLOW YELLOW   APPearance CLEAR CLEAR   Specific Gravity, Urine 1.020 1.005 - 1.030   pH 6.0 5.0 - 8.0   Glucose, UA NEGATIVE NEGATIVE mg/dL   Hgb urine dipstick TRACE (A) NEGATIVE   Bilirubin Urine NEGATIVE NEGATIVE   Ketones, ur NEGATIVE NEGATIVE mg/dL   Protein, ur NEGATIVE NEGATIVE mg/dL   Nitrite NEGATIVE NEGATIVE   Leukocytes, UA NEGATIVE NEGATIVE  Pregnancy, urine     Status: None   Collection Time: 06/25/17  4:40 AM  Result Value Ref Range   Preg Test, Ur NEGATIVE NEGATIVE  Urinalysis, Microscopic (reflex)     Status: Abnormal   Collection Time: 06/25/17  4:40 AM  Result Value Ref Range   RBC / HPF 0-5 0 - 5 RBC/hpf   WBC, UA 0-5 0 - 5 WBC/hpf   Bacteria, UA NONE SEEN NONE SEEN   Squamous Epithelial / LPF 0-5 (A) NONE SEEN  Wet prep, genital     Status: Abnormal   Collection Time: 06/25/17  6:25 AM  Result Value Ref Range   Yeast Wet  Prep HPF POC NONE SEEN NONE SEEN   Trich, Wet Prep NONE SEEN NONE SEEN   Clue Cells Wet Prep HPF POC NONE SEEN NONE SEEN   WBC, Wet Prep HPF POC FEW (A) NONE SEEN   Sperm NONE SEEN    Imaging Studies: No results found.  ED COURSE  Nursing notes and initial vitals signs, including  pulse oximetry, reviewed.  Vitals:   06/25/17 0010 06/25/17 0447 06/25/17 0515 06/25/17 0530  BP:  (!) 141/93 116/71 115/72  Pulse:  73 (!) 57 (!) 57  Resp:  16    Temp:      TempSrc:      SpO2:  100% 98% 98%  Weight: 106.3 kg (234 lb 6.4 oz)     Height: 5\' 11"  (1.803 m)      6:42 AM The patient has normal vital signs.  Her CBC is within normal limits.  Her urinalysis is not consistent with dehydration.  She is having vaginal bleeding but on my exam this was not heavy.  If she was having bleeding as heavily as she reports it appears to have slowed.  She is not anemic.  I do not believe any acute intervention is required at this time.  She is not in need of a blood transfusion and is not in need of IV fluids.  We will have her return for an outpatient ultrasound to evaluate her uterus.  PROCEDURES    ED DIAGNOSES     ICD-10-CM   1. Vaginal bleeding N93.9        Gari Trovato, Jonny RuizJohn, MD 06/25/17 [phone]

## 2017-06-25 NOTE — ED Triage Notes (Signed)
Pt delivered triplets 05/16/17. States she has had heavy bleeding for 3 wks "with large clots". States she called her OB who advised her to come to ED.

## 2017-06-25 NOTE — ED Notes (Signed)
Alert, NAD, calm, interactive, resps e/u, speaking in clear complete sentences, no dyspnea noted, skin W&D, VSS, here for heavy bleeding, (20 pads in last 24 hrs, 3 pads in last hour), also reports sometimes blurry vision and lips tingle, and R groin/pelvis pain, 8/10, (denies: NVD, sob, back pain, vaginal d/c, odor, itching, urinary sx, dizziness).   Last ate in w/r. Last BM today. Last OB visit was 2 weeks ago ([facility]), ("had a UTI, prescribed meds, never took them"). No relief with ibuprofen 800mg , last taken yesterday. Mentions h/o pre-eclampsia. No longer taking BP meds.

## 2017-06-25 NOTE — ED Provider Notes (Signed)
15: 30 patient return for pelvic ultrasound.  I have reviewed results with her.  At this time results do not show emergent conditions.  Patient is alert and clinically well in appearance.  She is in no distress.  She has follow-up with GYN day after tomorrow.  She is counseled on return if symptoms change or worsen.   Arby BarrettePfeiffer, Gina Stiner, MD 06/25/17 1550

## 2017-06-25 NOTE — ED Notes (Signed)
Pt given urine cup and asked for sample.

## 2017-06-26 LAB — GC/CHLAMYDIA PROBE AMP (~~LOC~~) NOT AT ARMC
CHLAMYDIA, DNA PROBE: NEGATIVE
NEISSERIA GONORRHEA: NEGATIVE

## 2017-09-03 ENCOUNTER — Encounter (HOSPITAL_BASED_OUTPATIENT_CLINIC_OR_DEPARTMENT_OTHER): Payer: Self-pay | Admitting: *Deleted

## 2017-09-03 ENCOUNTER — Other Ambulatory Visit: Payer: Self-pay

## 2017-09-03 ENCOUNTER — Emergency Department (HOSPITAL_BASED_OUTPATIENT_CLINIC_OR_DEPARTMENT_OTHER)
Admission: EM | Admit: 2017-09-03 | Discharge: 2017-09-03 | Disposition: A | Discharge status: Home / Self Care | Payer: Medicaid Other | Attending: Emergency Medicine | Admitting: Emergency Medicine

## 2017-09-03 DIAGNOSIS — J45909 Unspecified asthma, uncomplicated: Secondary | ICD-10-CM | POA: Insufficient documentation

## 2017-09-03 DIAGNOSIS — R1012 Left upper quadrant pain: Secondary | ICD-10-CM | POA: Insufficient documentation

## 2017-09-03 DIAGNOSIS — R109 Unspecified abdominal pain: Secondary | ICD-10-CM | POA: Diagnosis present

## 2017-09-03 DIAGNOSIS — Z79899 Other long term (current) drug therapy: Secondary | ICD-10-CM | POA: Diagnosis not present

## 2017-09-03 DIAGNOSIS — F17210 Nicotine dependence, cigarettes, uncomplicated: Secondary | ICD-10-CM | POA: Insufficient documentation

## 2017-09-03 LAB — CBC WITH DIFFERENTIAL/PLATELET
BASOS ABS: 0 10*3/uL (ref 0.0–0.1)
BASOS PCT: 0 %
EOS ABS: 0.2 10*3/uL (ref 0.0–0.7)
Eosinophils Relative: 3 %
HCT: 37.7 % (ref 36.0–46.0)
HEMOGLOBIN: 12.5 g/dL (ref 12.0–15.0)
LYMPHS ABS: 2.1 10*3/uL (ref 0.7–4.0)
Lymphocytes Relative: 31 %
MCH: 27.1 pg (ref 26.0–34.0)
MCHC: 33.2 g/dL (ref 30.0–36.0)
MCV: 81.6 fL (ref 78.0–100.0)
Monocytes Absolute: 0.5 10*3/uL (ref 0.1–1.0)
Monocytes Relative: 8 %
NEUTROS PCT: 58 %
Neutro Abs: 4.1 10*3/uL (ref 1.7–7.7)
Platelets: 348 10*3/uL (ref 150–400)
RBC: 4.62 MIL/uL (ref 3.87–5.11)
RDW: 13.9 % (ref 11.5–15.5)
WBC: 6.9 10*3/uL (ref 4.0–10.5)

## 2017-09-03 LAB — COMPREHENSIVE METABOLIC PANEL
ALBUMIN: 3.7 g/dL (ref 3.5–5.0)
ALK PHOS: 82 U/L (ref 38–126)
ALT: 20 U/L (ref 14–54)
AST: 22 U/L (ref 15–41)
Anion gap: 9 (ref 5–15)
BUN: 14 mg/dL (ref 6–20)
CO2: 23 mmol/L (ref 22–32)
CREATININE: 0.76 mg/dL (ref 0.44–1.00)
Calcium: 9.3 mg/dL (ref 8.9–10.3)
Chloride: 106 mmol/L (ref 101–111)
GFR calc Af Amer: >60 mL/min (ref >60)
GFR calc non Af Amer: >60 mL/min (ref >60)
Glucose, Bld: 97 mg/dL (ref 65–99)
Potassium: 3.9 mmol/L (ref 3.5–5.1)
SODIUM: 138 mmol/L (ref 135–145)
Total Bilirubin: 0.3 mg/dL (ref 0.3–1.2)
Total Protein: 7.2 g/dL (ref 6.5–8.1)

## 2017-09-03 LAB — URINALYSIS, ROUTINE W REFLEX MICROSCOPIC
Bilirubin Urine: NEGATIVE
Glucose, UA: NEGATIVE mg/dL
Hgb urine dipstick: NEGATIVE
KETONES UR: NEGATIVE mg/dL
Leukocytes, UA: NEGATIVE
NITRITE: NEGATIVE
PH: 6 (ref 5.0–8.0)
Protein, ur: NEGATIVE mg/dL
Specific Gravity, Urine: 1.025 (ref 1.005–1.030)

## 2017-09-03 LAB — LIPASE, BLOOD: Lipase: 25 U/L (ref 11–51)

## 2017-09-03 LAB — PREGNANCY, URINE: Preg Test, Ur: NEGATIVE

## 2017-09-03 MED ORDER — CYCLOBENZAPRINE HCL 10 MG PO TABS: 10.0000 mg | ORAL_TABLET | Freq: Two times a day (BID) | ORAL | 0 refills | Status: DC | PRN

## 2017-09-03 MED ORDER — KETOROLAC TROMETHAMINE 30 MG/ML IJ SOLN
15.0000 mg | Freq: Once | INTRAMUSCULAR | Status: AC
  Administered 2017-09-03: 15 mg via INTRAVENOUS
  Filled 2017-09-03: qty 1

## 2017-09-03 MED ORDER — OMEPRAZOLE 20 MG PO CPDR: 20.0000 mg | DELAYED_RELEASE_CAPSULE | Freq: Every day | ORAL | 0 refills | Status: DC

## 2017-09-03 MED ORDER — NAPROXEN 500 MG PO TABS: 500.0000 mg | ORAL_TABLET | Freq: Two times a day (BID) | ORAL | 0 refills | Status: DC

## 2017-09-03 NOTE — ED Triage Notes (Addendum)
Left flank pain radiating to LLQ x 1-2 weeks. Denies urinary s/s or vaginal d/c. Denies injury.

## 2017-09-03 NOTE — ED Provider Notes (Signed)
[facility] EMERGENCY DEPARTMENT Provider Note   CSN: [id number] Arrival date & time: 09/03/17  0631     History   Chief Complaint Chief Complaint  Patient presents with  . Flank Pain    HPI Gina Martin is a 27 y.o. female.  Patient is a 27 year old female with a history of asthma, hyperlipidemia, bipolar disorder and schizophrenia who is postpartum by 4 months with triplets via C-section who is presenting today with left flank and side pain.  Patient states the pain started about a week and a half ago as an intermittent pain that would come several times a day lasting approximately 1 hour.  She states initially when the pain started it seemed to be more in her stomach as well but has now settled over into the left side.  The pain sometimes will cause her to have nausea but she denies any vomiting.  She has not had any diarrhea, urinary changes or vaginal symptoms.  Yesterday the pain did start after eating but she states it is not always related to eating.  When the pain is not present she has no issues.  When it is there it is worse with coughing and stretching.  She describes it as a deep sharp type pain but it does not radiate.  The history is provided by the patient.  Flank Pain  This is a new problem. Episode onset: 1.5 weeks. The problem occurs daily. The problem has been gradually worsening. Associated symptoms include abdominal pain. Associated symptoms comments: nausea. Exacerbated by: Patient states nothing really seems to make the pain start but when it is they are it is more uncomfortable when she coughs or stretches. Relieved by: She has not tried anything to make the pain go away it usually just gets better on its own. She has tried nothing for the symptoms. The treatment provided no relief.    Past Medical History:  Diagnosis Date  . Asthma   . Bipolar 1 disorder (HCC)   . Hyperlipidemia   . Schizophrenia (HCC)     There are no active problems to  display for this patient.   Past Surgical History:  Procedure Laterality Date  . CESAREAN SECTION       OB History    Gravida  3   Para  1   Term      Preterm      AB      Living        SAB      TAB      Ectopic      Multiple      Live Births               Home Medications    Prior to Admission medications   Medication Sig Start Date End Date Taking? Authorizing Provider  albuterol (PROVENTIL HFA;VENTOLIN HFA) 108 (90 Base) MCG/ACT inhaler Inhale 2 puffs into the lungs every 6 (six) hours as needed for wheezing or shortness of breath.    [provider]  aspirin 81 MG chewable tablet Chew by mouth daily.    [provider]  IRON PO Take by mouth.    [provider]  Prenatal Vit-Fe Fumarate-FA (PRENATAL MULTIVITAMIN) TABS tablet Take 1 tablet by mouth daily at 12 noon.    [provider]  Sulfamethoxazole-Trimethoprim (SEPTRA PO) Take by mouth.    [provider]    Family History History reviewed. No pertinent family history.  Social History Social History  Tobacco Use  . Smoking status: Current Every Day Smoker    Packs/day: 0.50    Types: Cigarettes  . Smokeless tobacco: Never Used  Substance Use Topics  . Alcohol use: No  . Drug use: No     Allergies   Patient has no known allergies.   Review of Systems Review of Systems  Gastrointestinal: Positive for abdominal pain.  Genitourinary: Positive for flank pain.  All other systems reviewed and are negative.    Physical Exam Updated Vital Signs BP 138/87 (BP Location: Left Arm)   Pulse 80   Temp 98.5 F (36.9 C) (Oral)   Resp 18   Ht  (1.803 m)   Wt 105.2 kg (232 lb)   LMP 08/18/2017 (Exact Date)   SpO2 99%   BMI 32.36 kg/m   Physical Exam  Constitutional: She is oriented to person, place, and time. She appears well-developed and well-nourished. No distress.  HENT:  Head: Normocephalic and atraumatic.  Mouth/Throat:  Oropharynx is clear and moist.  Eyes: Pupils are equal, round, and reactive to light. Conjunctivae and EOM are normal.  Neck: Normal range of motion. Neck supple.  Cardiovascular: Normal rate, regular rhythm and intact distal pulses.  No murmur heard. Pulmonary/Chest: Effort normal and breath sounds normal. No respiratory distress. She has no wheezes. She has no rales.  Abdominal: Soft. She exhibits no distension. There is no hepatosplenomegaly. There is tenderness in the left lower quadrant. There is CVA tenderness. There is no rebound and no guarding. No hernia.    Musculoskeletal: Normal range of motion. She exhibits no edema or tenderness.  Neurological: She is alert and oriented to person, place, and time.  Skin: Skin is warm and dry. No rash noted. No erythema.  Psychiatric: She has a normal mood and affect. Her behavior is normal.  Nursing note and vitals reviewed.    ED Treatments / Results  Labs (all labs ordered are listed, but only abnormal results are displayed) Labs Reviewed  URINALYSIS, ROUTINE W REFLEX MICROSCOPIC  PREGNANCY, URINE  CBC WITH DIFFERENTIAL/PLATELET  COMPREHENSIVE METABOLIC PANEL  LIPASE, BLOOD    EKG None  Radiology No results found.  Procedures Procedures (including critical care time)  Medications Ordered in ED Medications  ketorolac (TORADOL) 30 MG/ML injection 15 mg (has no administration in time range)     Initial Impression / Assessment and Plan / ED Course  I have reviewed the triage vital signs and the nursing notes.  Pertinent labs & imaging results that were available during my care of the patient were reviewed by me and considered in my medical decision making (see chart for details).    27 year old female presenting with nonspecific left side/flank pain.  Symptoms have seemed to be worsening over the last 1-1/2 weeks but does not seem to have a trigger or something that she can directly relate as the cause.  Sometimes it  starts after eating but sometimes it starts without eating.  When the pain is present it is worse with movement and coughing but she denies those things causing the pain.  Normally the pain would only last an hour but yesterday she felt bad most of the day and the pain has been persistent since 4 AM.  She has had no urinary or vaginal symptoms.  On exam she has more left side and mild left flank pain.  There are no rashes or evidence of shingles.  She has no right-sided pain concerning for cholecystitis, hepatitis or appendicitis.  Lower  suspicion for kidney stone based on patient's history and exam.  There are no red cells in her urine and UA is negative for evidence of infection.  Patient does not drink alcohol has not changed any medications.  Of note she recently had a C-section 4 months ago for triplets but is not breast-feeding at this time.  She is well-appearing on exam with normal vital signs.  Looking through prior emergency room visits several years ago she was seen for similar type pain and at that time had normal lab work.  She had a CT scan done most recently on 08/19/2015 for similar left-sided abdominal pain which showed no acute findings.  She does occasionally have heartburn and this could be stomach related versus other GI cause.  Patient given Toradol for pain.  Labs are reassuring.  Patient is in no acute distress.  Based on review of prior visits and multiple CAT scans in the past that have all been normal do not feel that patient needs repeat imaging today.  Will treat with Prilosec, naproxen and Flexeril for possible stomach versus musculoskeletal issue.  Encourage patient to follow-up with PCP in 1 week if not better.  Final Clinical Impressions(s) / ED Diagnoses   Final diagnoses:  Left upper quadrant pain     ED Discharge Orders        Ordered    naproxen (NAPROSYN) 500 MG tablet  2 times daily     09/03/17 0832    cyclobenzaprine (FLEXERIL) 10 MG tablet  2 times daily PRN       09/03/17 0832    omeprazole (PRILOSEC) 20 MG capsule  Daily     09/03/17 0832       Gwyneth Sprout, MD 09/03/17 [phone]

## 2017-09-03 NOTE — Discharge Instructions (Addendum)
Take the new medications as prescribed.  Follow-up with your doctor in 1 week if you are not feeling any better.  Return immediately if you develop high fever, vomiting or excruciating pain.

## 2017-10-18 ENCOUNTER — Other Ambulatory Visit: Payer: Self-pay

## 2017-10-18 ENCOUNTER — Encounter (HOSPITAL_BASED_OUTPATIENT_CLINIC_OR_DEPARTMENT_OTHER): Payer: Self-pay

## 2017-10-18 DIAGNOSIS — Z79899 Other long term (current) drug therapy: Secondary | ICD-10-CM | POA: Diagnosis not present

## 2017-10-18 DIAGNOSIS — Y998 Other external cause status: Secondary | ICD-10-CM | POA: Diagnosis not present

## 2017-10-18 DIAGNOSIS — F209 Schizophrenia, unspecified: Secondary | ICD-10-CM | POA: Diagnosis not present

## 2017-10-18 DIAGNOSIS — F319 Bipolar disorder, unspecified: Secondary | ICD-10-CM | POA: Insufficient documentation

## 2017-10-18 DIAGNOSIS — J45909 Unspecified asthma, uncomplicated: Secondary | ICD-10-CM | POA: Insufficient documentation

## 2017-10-18 DIAGNOSIS — Y9389 Activity, other specified: Secondary | ICD-10-CM | POA: Insufficient documentation

## 2017-10-18 DIAGNOSIS — T7840XA Allergy, unspecified, initial encounter: Secondary | ICD-10-CM | POA: Diagnosis not present

## 2017-10-18 DIAGNOSIS — F17210 Nicotine dependence, cigarettes, uncomplicated: Secondary | ICD-10-CM | POA: Diagnosis not present

## 2017-10-18 DIAGNOSIS — Y92008 Other place in unspecified non-institutional (private) residence as the place of occurrence of the external cause: Secondary | ICD-10-CM | POA: Diagnosis not present

## 2017-10-18 DIAGNOSIS — Z7982 Long term (current) use of aspirin: Secondary | ICD-10-CM | POA: Insufficient documentation

## 2017-10-18 DIAGNOSIS — S20161A Insect bite (nonvenomous) of breast, right breast, initial encounter: Secondary | ICD-10-CM | POA: Insufficient documentation

## 2017-10-18 DIAGNOSIS — W57XXXA Bitten or stung by nonvenomous insect and other nonvenomous arthropods, initial encounter: Secondary | ICD-10-CM | POA: Insufficient documentation

## 2017-10-18 NOTE — ED Triage Notes (Signed)
Pt c/o bug bite to right breast tonight, is not very forthcoming or cooperative in triage

## 2017-10-19 ENCOUNTER — Emergency Department (HOSPITAL_BASED_OUTPATIENT_CLINIC_OR_DEPARTMENT_OTHER)
Admission: EM | Admit: 2017-10-19 | Discharge: 2017-10-19 | Disposition: A | Discharge status: Home / Self Care | Payer: Medicaid Other | Attending: Emergency Medicine | Admitting: Emergency Medicine

## 2017-10-19 DIAGNOSIS — T7840XA Allergy, unspecified, initial encounter: Secondary | ICD-10-CM

## 2017-10-19 DIAGNOSIS — T63484A Toxic effect of venom of other arthropod, undetermined, initial encounter: Secondary | ICD-10-CM

## 2017-10-19 MED ORDER — PREDNISONE 20 MG PO TABS
20.0000 mg | ORAL_TABLET | Freq: Once | ORAL | Status: AC
  Administered 2017-10-19: 20 mg via ORAL
  Filled 2017-10-19: qty 1

## 2017-10-19 MED ORDER — PREDNISONE 10 MG PO TABS: 20.0000 mg | ORAL_TABLET | Freq: Two times a day (BID) | ORAL | 0 refills | Status: DC

## 2017-10-19 MED ORDER — PREDNISONE 20 MG PO TABS: 20.0000 mg | ORAL_TABLET | Freq: Once | ORAL | Status: DC

## 2017-10-19 NOTE — ED Provider Notes (Signed)
[facility] EMERGENCY DEPARTMENT Provider Note   CSN: [id number] Arrival date & time: 10/18/17  2328     History   Chief Complaint Chief Complaint  Patient presents with  . Insect Bite    HPI Gina Martin is a 27 y.o. female.  Patient is a 27 year old female with no significant past medical history.  She presents for evaluation of possible allergic reaction.  She states that she was sitting on the front porch when she was bitten on the right breast by an insect.  Shortly afterward she began feeling short of breath and flushed.  It sounds as if she began to hyperventilate and had what also sounds like a syncopal episode.  She was put in the bathtub by her family, then given Benadryl.  Her symptoms have since improved and she now feels fine.  After a several hour wait in the emergency department she is back to baseline and has no complaints.  The history is provided by the patient.    Past Medical History:  Diagnosis Date  . Asthma   . Bipolar 1 disorder (HCC)   . Hyperlipidemia   . Schizophrenia (HCC)     There are no active problems to display for this patient.   Past Surgical History:  Procedure Laterality Date  . CESAREAN SECTION       OB History    Gravida  3   Para  1   Term      Preterm      AB      Living        SAB      TAB      Ectopic      Multiple      Live Births               Home Medications    Prior to Admission medications   Medication Sig Start Date End Date Taking? Authorizing Provider  albuterol (PROVENTIL HFA;VENTOLIN HFA) 108 (90 Base) MCG/ACT inhaler Inhale 2 puffs into the lungs every 6 (six) hours as needed for wheezing or shortness of breath.    [provider]  aspirin 81 MG chewable tablet Chew by mouth daily.    [provider]  cyclobenzaprine (FLEXERIL) 10 MG tablet Take 1 tablet (10 mg total) by mouth 2 (two) times daily as needed for muscle spasms. 09/03/17   Gwyneth Sprout, MD    IRON PO Take by mouth.    [provider]  naproxen (NAPROSYN) 500 MG tablet Take 1 tablet (500 mg total) by mouth 2 (two) times daily. 09/03/17   Gwyneth Sprout, MD  omeprazole (PRILOSEC) 20 MG capsule Take 1 capsule (20 mg total) by mouth daily. 09/03/17   Gwyneth Sprout, MD  Prenatal Vit-Fe Fumarate-FA (PRENATAL MULTIVITAMIN) TABS tablet Take 1 tablet by mouth daily at 12 noon.    [provider]  Sulfamethoxazole-Trimethoprim (SEPTRA PO) Take by mouth.    [provider]    Family History No family history on file.  Social History Social History   Tobacco Use  . Smoking status: Current Every Day Smoker    Packs/day: 0.50    Types: Cigarettes  . Smokeless tobacco: Never Used  Substance Use Topics  . Alcohol use: No  . Drug use: No     Allergies   Patient has no known allergies.   Review of Systems Review of Systems  All other systems reviewed and are negative.    Physical Exam Updated Vital Signs  BP 104/67 (BP Location: Right Arm)   Pulse 80   Temp 98 F (36.7 C) (Oral)   Resp 18   Ht 5\' 11"  (1.803 m)   Wt 104.8 kg (231 lb)   LMP 10/13/2017   SpO2 98%   BMI 32.22 kg/m   Physical Exam  Constitutional: She is oriented to person, place, and time. She appears well-developed and well-nourished. No distress.  HENT:  Head: Normocephalic and atraumatic.  Mouth/Throat: Oropharynx is clear and moist.  Neck: Normal range of motion. Neck supple.  Cardiovascular: Normal rate and regular rhythm. Exam reveals no gallop and no friction rub.  No murmur heard. Pulmonary/Chest: Effort normal and breath sounds normal. No respiratory distress. She has no wheezes.  Abdominal: Soft. Bowel sounds are normal. She exhibits no distension. There is no tenderness.  Musculoskeletal: Normal range of motion.  Neurological: She is alert and oriented to person, place, and time.  Skin: Skin is warm and dry. She is not diaphoretic.  Nursing note and vitals  reviewed.    ED Treatments / Results  Labs (all labs ordered are listed, but only abnormal results are displayed) Labs Reviewed - No data to display  EKG None  Radiology No results found.  Procedures Procedures (including critical care time)  Medications Ordered in ED Medications - No data to display   Initial Impression / Assessment and Plan / ED Course  I have reviewed the triage vital signs and the nursing notes.  Pertinent labs & imaging results that were available during my care of the patient were reviewed by me and considered in my medical decision making (see chart for details).  It sounds as if this patient had some sort of allergic reaction to an insect sting.  She appears well and in no distress.  I see no indication for further work-up.  I will treat this as an allergic reaction with prednisone and continued Benadryl for the next 48 hours.  If she worsens, she is to return.  She has been observed in the emergency department for several hours and has had no further symptoms.  Final Clinical Impressions(s) / ED Diagnoses   Final diagnoses:  None    ED Discharge Orders    None       Geoffery Lyonselo, Tyniesha Howald, MD 10/19/17 [phone]

## 2017-10-19 NOTE — Discharge Instructions (Addendum)
Prednisone as prescribed.  Continue taking Benadryl 25 mg every 8 hours for the next 2 days.  Return to the emergency department for difficulty breathing or swallowing, chest pain, or other new and concerning symptoms.

## 2018-01-28 ENCOUNTER — Emergency Department (HOSPITAL_BASED_OUTPATIENT_CLINIC_OR_DEPARTMENT_OTHER)
Admission: EM | Admit: 2018-01-28 | Discharge: 2018-01-29 | Disposition: A | Discharge status: Home / Self Care | Payer: Medicaid Other | Attending: Emergency Medicine | Admitting: Emergency Medicine

## 2018-01-28 ENCOUNTER — Other Ambulatory Visit: Payer: Self-pay

## 2018-01-28 ENCOUNTER — Emergency Department (HOSPITAL_BASED_OUTPATIENT_CLINIC_OR_DEPARTMENT_OTHER): Payer: Medicaid Other

## 2018-01-28 ENCOUNTER — Encounter (HOSPITAL_BASED_OUTPATIENT_CLINIC_OR_DEPARTMENT_OTHER): Payer: Self-pay | Admitting: *Deleted

## 2018-01-28 DIAGNOSIS — Z79899 Other long term (current) drug therapy: Secondary | ICD-10-CM | POA: Insufficient documentation

## 2018-01-28 DIAGNOSIS — Z7982 Long term (current) use of aspirin: Secondary | ICD-10-CM | POA: Diagnosis not present

## 2018-01-28 DIAGNOSIS — R69 Illness, unspecified: Secondary | ICD-10-CM

## 2018-01-28 DIAGNOSIS — J111 Influenza due to unidentified influenza virus with other respiratory manifestations: Secondary | ICD-10-CM | POA: Diagnosis not present

## 2018-01-28 DIAGNOSIS — J069 Acute upper respiratory infection, unspecified: Secondary | ICD-10-CM

## 2018-01-28 DIAGNOSIS — F17210 Nicotine dependence, cigarettes, uncomplicated: Secondary | ICD-10-CM | POA: Diagnosis not present

## 2018-01-28 DIAGNOSIS — J45909 Unspecified asthma, uncomplicated: Secondary | ICD-10-CM | POA: Insufficient documentation

## 2018-01-28 DIAGNOSIS — R509 Fever, unspecified: Secondary | ICD-10-CM | POA: Diagnosis present

## 2018-01-28 MED ORDER — ACETAMINOPHEN 325 MG PO TABS
ORAL_TABLET | ORAL | Status: AC
  Administered 2018-01-28: 650 mg via ORAL
  Filled 2018-01-28: qty 2

## 2018-01-28 MED ORDER — IBUPROFEN 600 MG PO TABS: 600.0000 mg | ORAL_TABLET | Freq: Four times a day (QID) | ORAL | 0 refills | Status: DC | PRN

## 2018-01-28 MED ORDER — ACETAMINOPHEN 325 MG PO TABS
650.0000 mg | ORAL_TABLET | Freq: Once | ORAL | Status: AC | PRN
  Administered 2018-01-28: 650 mg via ORAL

## 2018-01-28 NOTE — ED Notes (Signed)
Pt understood dc material. NAD noted. Scripts sent electronically. 

## 2018-01-28 NOTE — ED Provider Notes (Addendum)
[facility] EMERGENCY DEPARTMENT Provider Note   CSN: [id number] Arrival date & time: 01/28/18  2047     History   Chief Complaint Chief Complaint  Patient presents with  . Fever  . Generalized Body Aches    HPI Gina Martin is a 27 y.o. female.  HPI 27 year old female comes in with chief complaint of fevers and body aches. Patient has history of asthma.  She reports that she has been unwell for the past 2 weeks, with symptoms of subjective fevers, body aches and chills.  Her symptoms started getting worse over the past 2 days therefore she decided to come to the ER.  Patient is having mainly URI-like symptoms with a cough that has yellow phlegm.  She has no chest pain, shortness of breath or increased wheezing.  Patient reports that her own kids have been sick with URI-like symptoms.  Past Medical History:  Diagnosis Date  . Asthma   . Bipolar 1 disorder (HCC)   . Hyperlipidemia   . Schizophrenia (HCC)     There are no active problems to display for this patient.   Past Surgical History:  Procedure Laterality Date  . CESAREAN SECTION       OB History    Gravida  3   Para  1   Term      Preterm      AB      Living        SAB      TAB      Ectopic      Multiple      Live Births               Home Medications    Prior to Admission medications   Medication Sig Start Date End Date Taking? Authorizing Provider  albuterol (PROVENTIL HFA;VENTOLIN HFA) 108 (90 Base) MCG/ACT inhaler Inhale 2 puffs into the lungs every 6 (six) hours as needed for wheezing or shortness of breath.   Yes [provider]  aspirin 81 MG chewable tablet Chew by mouth daily.   Yes [provider]  cyclobenzaprine (FLEXERIL) 10 MG tablet Take 1 tablet (10 mg total) by mouth 2 (two) times daily as needed for muscle spasms. 09/03/17   Gwyneth Sprout, MD  ibuprofen (ADVIL,MOTRIN) 600 MG tablet Take 1 tablet (600 mg total) by mouth every 6 (six)  hours as needed. 01/28/18   Derwood Kaplan, MD  IRON PO Take by mouth.    [provider]  omeprazole (PRILOSEC) 20 MG capsule Take 1 capsule (20 mg total) by mouth daily. 09/03/17   Gwyneth Sprout, MD  predniSONE (DELTASONE) 10 MG tablet Take 2 tablets (20 mg total) by mouth 2 (two) times daily with a meal. 10/19/17   Geoffery Lyons, MD  Prenatal Vit-Fe Fumarate-FA (PRENATAL MULTIVITAMIN) TABS tablet Take 1 tablet by mouth daily at 12 noon.    [provider]  Sulfamethoxazole-Trimethoprim (SEPTRA PO) Take by mouth.    [provider]    Family History No family history on file.  Social History Social History   Tobacco Use  . Smoking status: Current Every Day Smoker    Packs/day: 0.50    Types: Cigarettes  . Smokeless tobacco: Never Used  Substance Use Topics  . Alcohol use: No  . Drug use: No     Allergies   Patient has no known allergies.   Review of Systems Review of Systems  Constitutional: Positive for activity change, chills and fever.  HENT: Positive for congestion, postnasal drip and rhinorrhea. Negative for drooling and sore throat.   Respiratory: Positive for cough and shortness of breath.   Cardiovascular: Negative for chest pain.  Gastrointestinal: Negative for nausea and vomiting.  Genitourinary: Negative for dysuria.  Musculoskeletal: Positive for arthralgias and myalgias. Negative for neck pain and neck stiffness.  Skin: Negative for rash.  Allergic/Immunologic: Negative for immunocompromised state.  Neurological: Negative for headaches.  All other systems reviewed and are negative.    Physical Exam Updated Vital Signs BP (!) 110/55   Pulse 90   Temp 98.7 F (37.1 C) (Oral)   Resp 19   Ht 5\' 11"  (1.803 m)   Wt 107.1 kg   LMP 01/05/2018   SpO2 100%   Breastfeeding? No   BMI 32.93 kg/m   Physical Exam  Constitutional: She is oriented to person, place, and time. She appears well-developed and well-nourished.  HENT:    Head: Normocephalic and atraumatic.  Eyes: Pupils are equal, round, and reactive to light. EOM are normal.  Neck: Neck supple.  Cardiovascular: Regular rhythm and normal heart sounds.  tachycardia  Pulmonary/Chest: Effort normal. No respiratory distress.  Abdominal: Soft. She exhibits no distension. There is no tenderness. There is no rebound and no guarding.  Musculoskeletal: She exhibits no edema or tenderness.  Neurological: She is alert and oriented to person, place, and time.  Skin: Skin is warm and dry.  Nursing note and vitals reviewed.    ED Treatments / Results  Labs (all labs ordered are listed, but only abnormal results are displayed) Labs Reviewed - No data to display  EKG None  Radiology Dg Chest 2 View  Result Date: 01/28/2018 CLINICAL DATA:  Cough, congestion x2 days EXAM: CHEST - 2 VIEW COMPARISON:  06/12/2016 FINDINGS: Lungs are clear.  No pleural effusion or pneumothorax. The heart is normal in size. Visualized osseous structures are within normal limits. IMPRESSION: Normal chest radiographs. Electronically Signed   By: Charline Bills M.D.   On: 01/28/2018 23:27    Procedures Procedures (including critical care time)  Medications Ordered in ED Medications  acetaminophen (TYLENOL) tablet 650 mg (650 mg Oral Given 01/28/18 2113)     Initial Impression / Assessment and Plan / ED Course  I have reviewed the triage vital signs and the nursing notes.  Pertinent labs & imaging results that were available during my care of the patient were reviewed by me and considered in my medical decision making (see chart for details).  Clinical Course as of Jan 29 2352  Wynelle Link Jan 28, 2018  2353 Patient reassessed. Pt is comfortable at this time.  Her vital signs have improved over time and her fever has resolved. Results of the workup discussed. Strict ER return precautions discussed. Follow up instruction discussed, and pt agrees with the plan and is comfortable with  it.    [AN]    Clinical Course User Index [AN] Derwood Kaplan, MD    DDX includes: Viral syndrome Influenza Pharyngitis Sinusitis Mononucleosis Superimposed pneumonia  27 year old female comes in with what appears to be viral upper respiratory infection with cough.  On exam she has cervical lymphadenopathy, otherwise her lungs are clear.  Given that she has had worsening of her symptoms and her phlegm has change in color, we will get a chest x-ray to ensure there is no superimposed pneumonia.  If the x-ray is negative then we will treat patient as if she has flulike illness.  Final Clinical Impressions(s) / ED Diagnoses  Final diagnoses:  Influenza-like illness  Viral upper respiratory tract infection    ED Discharge Orders         Ordered    ibuprofen (ADVIL,MOTRIN) 600 MG tablet  Every 6 hours PRN     01/28/18 2351           Derwood Kaplan, MD 01/28/18 4098    Derwood Kaplan, MD 01/28/18 2354

## 2018-01-28 NOTE — ED Triage Notes (Signed)
Pt reports body aches, fatigue, and fever x 1 day

## 2018-01-28 NOTE — Discharge Instructions (Signed)
We saw you in the ER for body aches, runny nose and fevers. We think what you have is a viral syndrome - the treatment for which is symptomatic relief only, and your body will fight the infection off in a few days. We are prescribing you some meds for pain and fevers. See your primary care doctor in 1 week if the symptoms dont improve.

## 2018-03-17 ENCOUNTER — Other Ambulatory Visit: Payer: Self-pay

## 2018-03-17 ENCOUNTER — Emergency Department (HOSPITAL_BASED_OUTPATIENT_CLINIC_OR_DEPARTMENT_OTHER)
Admission: EM | Admit: 2018-03-17 | Discharge: 2018-03-17 | Disposition: A | Discharge status: Home / Self Care | Payer: Medicaid Other | Attending: Emergency Medicine | Admitting: Emergency Medicine

## 2018-03-17 ENCOUNTER — Encounter (HOSPITAL_BASED_OUTPATIENT_CLINIC_OR_DEPARTMENT_OTHER): Payer: Self-pay | Admitting: Emergency Medicine

## 2018-03-17 DIAGNOSIS — F17210 Nicotine dependence, cigarettes, uncomplicated: Secondary | ICD-10-CM | POA: Insufficient documentation

## 2018-03-17 DIAGNOSIS — N938 Other specified abnormal uterine and vaginal bleeding: Secondary | ICD-10-CM | POA: Insufficient documentation

## 2018-03-17 LAB — URINALYSIS, ROUTINE W REFLEX MICROSCOPIC
BILIRUBIN URINE: NEGATIVE
Glucose, UA: NEGATIVE mg/dL
HGB URINE DIPSTICK: NEGATIVE
Ketones, ur: NEGATIVE mg/dL
Leukocytes, UA: NEGATIVE
NITRITE: NEGATIVE
PROTEIN: NEGATIVE mg/dL
Specific Gravity, Urine: 1.025 (ref 1.005–1.030)
pH: 6 (ref 5.0–8.0)

## 2018-03-17 LAB — COMPREHENSIVE METABOLIC PANEL
ALT: 17 U/L (ref 0–44)
ANION GAP: 9 (ref 5–15)
AST: 17 U/L (ref 15–41)
Albumin: 3.9 g/dL (ref 3.5–5.0)
Alkaline Phosphatase: 72 U/L (ref 38–126)
BILIRUBIN TOTAL: 0.4 mg/dL (ref 0.3–1.2)
BUN: 12 mg/dL (ref 6–20)
CO2: 23 mmol/L (ref 22–32)
Calcium: 9.5 mg/dL (ref 8.9–10.3)
Chloride: 104 mmol/L (ref 98–111)
Creatinine, Ser: 0.68 mg/dL (ref 0.44–1.00)
Glucose, Bld: 112 mg/dL — ABNORMAL HIGH (ref 70–99)
POTASSIUM: 3.9 mmol/L (ref 3.5–5.1)
Sodium: 136 mmol/L (ref 135–145)
TOTAL PROTEIN: 7.6 g/dL (ref 6.5–8.1)

## 2018-03-17 LAB — CBC
HCT: 40.1 % (ref 36.0–46.0)
HEMOGLOBIN: 12.4 g/dL (ref 12.0–15.0)
MCH: 25.9 pg — ABNORMAL LOW (ref 26.0–34.0)
MCHC: 30.9 g/dL (ref 30.0–36.0)
MCV: 83.9 fL (ref 80.0–100.0)
NRBC: 0 % (ref 0.0–0.2)
Platelets: 359 10*3/uL (ref 150–400)
RBC: 4.78 MIL/uL (ref 3.87–5.11)
RDW: 14.5 % (ref 11.5–15.5)
WBC: 8.4 10*3/uL (ref 4.0–10.5)

## 2018-03-17 LAB — LIPASE, BLOOD: Lipase: 25 U/L (ref 11–51)

## 2018-03-17 LAB — PREGNANCY, URINE: PREG TEST UR: NEGATIVE

## 2018-03-17 NOTE — Discharge Instructions (Addendum)
1.  Schedule follow-up appointment with your gynecologist for irregular menstrual bleeding as soon as possible. 2.  Return to the emergency department if you develop fever, increased bleeding, lightheadedness, vomiting or other concerning symptoms.

## 2018-03-17 NOTE — ED Provider Notes (Signed)
[facility] EMERGENCY DEPARTMENT Provider Note   CSN: [id number] Arrival date & time: 03/17/18  1015     History   Chief Complaint Chief Complaint  Patient presents with  . Abdominal Pain    HPI Gina Martin is a 27 y.o. female.  HPI Patient reports that she had some irregular vaginal bleeding.  She states that she had a normal menstrual cycle around the first week of October.  She reports that she was supposed to have had a menstrual cycle first week of November but she has not started one yet.  She reports she has had her tubes tied 10 months ago.  She reports that she had called her GYN doctor about this and due to possible risk of ectopic pregnancy she was advised to come to the emergency department for evaluation.  Patient denies she is having any pelvic pain.  She reports that maybe she felt slightly nauseated for the past several days.  She reports she really does not feel any different than usual. Past Medical History:  Diagnosis Date  . Asthma   . Bipolar 1 disorder (HCC)   . Hyperlipidemia   . Schizophrenia (HCC)     There are no active problems to display for this patient.   Past Surgical History:  Procedure Laterality Date  . CESAREAN SECTION       OB History    Gravida  3   Para  1   Term      Preterm      AB      Living        SAB      TAB      Ectopic      Multiple      Live Births               Home Medications    Prior to Admission medications   Medication Sig Start Date End Date Taking? Authorizing Provider  albuterol (PROVENTIL HFA;VENTOLIN HFA) 108 (90 Base) MCG/ACT inhaler Inhale 2 puffs into the lungs every 6 (six) hours as needed for wheezing or shortness of breath.    [provider]  aspirin 81 MG chewable tablet Chew by mouth daily.    [provider]  cyclobenzaprine (FLEXERIL) 10 MG tablet Take 1 tablet (10 mg total) by mouth 2 (two) times daily as needed for muscle spasms. 09/03/17    Gwyneth SproutPlunkett, Whitney, MD  ibuprofen (ADVIL,MOTRIN) 600 MG tablet Take 1 tablet (600 mg total) by mouth every 6 (six) hours as needed. 01/28/18   Derwood KaplanNanavati, Ankit, MD  IRON PO Take by mouth.    [provider]  omeprazole (PRILOSEC) 20 MG capsule Take 1 capsule (20 mg total) by mouth daily. 09/03/17   Gwyneth SproutPlunkett, Whitney, MD  predniSONE (DELTASONE) 10 MG tablet Take 2 tablets (20 mg total) by mouth 2 (two) times daily with a meal. 10/19/17   Geoffery Lyonselo, Douglas, MD  Prenatal Vit-Fe Fumarate-FA (PRENATAL MULTIVITAMIN) TABS tablet Take 1 tablet by mouth daily at 12 noon.    [provider]  Sulfamethoxazole-Trimethoprim (SEPTRA PO) Take by mouth.    [provider]    Family History History reviewed. No pertinent family history.  Social History Social History   Tobacco Use  . Smoking status: Current Every Day Smoker    Packs/day: 0.50    Types: Cigarettes  . Smokeless tobacco: Never Used  Substance Use Topics  . Alcohol use: No  . Drug use: No  Allergies   Patient has no known allergies.   Review of Systems Review of Systems 10 Systems reviewed and are negative for acute change except as noted in the HPI.   Physical Exam Updated Vital Signs BP 123/71   Pulse 94   Temp 98.6 F (37 C)   Resp 18   Ht 5\' 11"  (1.803 m)   Wt 107 kg   LMP 02/03/2018 (Approximate)   SpO2 100%   BMI 32.92 kg/m   Physical Exam  Constitutional: She is oriented to person, place, and time. She appears well-developed and well-nourished.  HENT:  Head: Normocephalic and atraumatic.  Eyes: Pupils are equal, round, and reactive to light. EOM are normal.  Neck: Neck supple.  Cardiovascular: Normal rate, regular rhythm, normal heart sounds and intact distal pulses.  Pulmonary/Chest: Effort normal and breath sounds normal.  Abdominal: Soft. Bowel sounds are normal. She exhibits no distension. There is no tenderness.  Musculoskeletal: Normal range of motion. She exhibits no edema.    Neurological: She is alert and oriented to person, place, and time. She has normal strength. Coordination normal. GCS eye subscore is 4. GCS verbal subscore is 5. GCS motor subscore is 6.  Skin: Skin is warm, dry and intact.  Psychiatric: She has a normal mood and affect.     ED Treatments / Results  Labs (all labs ordered are listed, but only abnormal results are displayed) Labs Reviewed  COMPREHENSIVE METABOLIC PANEL - Abnormal; Notable for the following components:      Result Value   Glucose, Bld 112 (*)    All other components within normal limits  CBC - Abnormal; Notable for the following components:   MCH 25.9 (*)    All other components within normal limits  URINALYSIS, ROUTINE W REFLEX MICROSCOPIC  PREGNANCY, URINE  LIPASE, BLOOD    EKG None  Radiology No results found.  Procedures Procedures (including critical care time)  Medications Ordered in ED Medications - No data to display   Initial Impression / Assessment and Plan / ED Course  I have reviewed the triage vital signs and the nursing notes.  Pertinent labs & imaging results that were available during my care of the patient were reviewed by me and considered in my medical decision making (see chart for details).    Patient presents for concern of a missed menstrual cycle.  She reports that her GYN office had concern of possible ectopic due to the fact that the patient has had a tubal ligation.  Patient does not have any pain suggestive of an ectopic pregnancy at this time.  Pregnancy test and urine are negative.  Review of systems is for very mild and nonspecific symptoms.  Patient has clinically well appearance.  At this time she is stable to continue follow-up with her GYN provider for dysfunctional menstrual bleeding\missed menstrual cycle.  Final Clinical Impressions(s) / ED Diagnoses   Final diagnoses:  Dysfunctional uterine bleeding    ED Discharge Orders    None       Arby Barrette,  MD 03/17/18 1303

## 2018-03-17 NOTE — ED Notes (Signed)
ED Provider at bedside. 

## 2018-03-17 NOTE — ED Triage Notes (Signed)
Reports lower abdominal cramping.  States called PCP who referred to ER for ectopic pregnancy.  States tubal ligation 10 months ago.

## 2018-05-26 ENCOUNTER — Emergency Department (HOSPITAL_BASED_OUTPATIENT_CLINIC_OR_DEPARTMENT_OTHER)
Admission: EM | Admit: 2018-05-26 | Discharge: 2018-05-26 | Disposition: A | Discharge status: Home / Self Care | Payer: Medicaid Other | Attending: Emergency Medicine | Admitting: Emergency Medicine

## 2018-05-26 ENCOUNTER — Emergency Department (HOSPITAL_BASED_OUTPATIENT_CLINIC_OR_DEPARTMENT_OTHER): Payer: Medicaid Other

## 2018-05-26 ENCOUNTER — Encounter (HOSPITAL_BASED_OUTPATIENT_CLINIC_OR_DEPARTMENT_OTHER): Payer: Self-pay | Admitting: *Deleted

## 2018-05-26 ENCOUNTER — Other Ambulatory Visit: Payer: Self-pay

## 2018-05-26 DIAGNOSIS — Z79899 Other long term (current) drug therapy: Secondary | ICD-10-CM | POA: Insufficient documentation

## 2018-05-26 DIAGNOSIS — R05 Cough: Secondary | ICD-10-CM | POA: Insufficient documentation

## 2018-05-26 DIAGNOSIS — R079 Chest pain, unspecified: Secondary | ICD-10-CM | POA: Insufficient documentation

## 2018-05-26 DIAGNOSIS — F17210 Nicotine dependence, cigarettes, uncomplicated: Secondary | ICD-10-CM | POA: Insufficient documentation

## 2018-05-26 DIAGNOSIS — R197 Diarrhea, unspecified: Secondary | ICD-10-CM | POA: Diagnosis not present

## 2018-05-26 DIAGNOSIS — R202 Paresthesia of skin: Secondary | ICD-10-CM | POA: Diagnosis not present

## 2018-05-26 DIAGNOSIS — J45909 Unspecified asthma, uncomplicated: Secondary | ICD-10-CM | POA: Insufficient documentation

## 2018-05-26 HISTORY — DX: Unspecified pre-eclampsia, unspecified trimester: O14.90

## 2018-05-26 LAB — CBC WITH DIFFERENTIAL/PLATELET
Abs Immature Granulocytes: 0.03 10*3/uL (ref 0.00–0.07)
Basophils Absolute: 0 10*3/uL (ref 0.0–0.1)
Basophils Relative: 0 %
EOS PCT: 4 %
Eosinophils Absolute: 0.3 10*3/uL (ref 0.0–0.5)
HEMATOCRIT: 43.4 % (ref 36.0–46.0)
Hemoglobin: 13.5 g/dL (ref 12.0–15.0)
Immature Granulocytes: 0 %
LYMPHS ABS: 2.4 10*3/uL (ref 0.7–4.0)
Lymphocytes Relative: 35 %
MCH: 26.7 pg (ref 26.0–34.0)
MCHC: 31.1 g/dL (ref 30.0–36.0)
MCV: 85.9 fL (ref 80.0–100.0)
Monocytes Absolute: 0.5 10*3/uL (ref 0.1–1.0)
Monocytes Relative: 8 %
Neutro Abs: 3.5 10*3/uL (ref 1.7–7.7)
Neutrophils Relative %: 53 %
Platelets: 326 10*3/uL (ref 150–400)
RBC: 5.05 MIL/uL (ref 3.87–5.11)
RDW: 14.2 % (ref 11.5–15.5)
WBC: 6.8 10*3/uL (ref 4.0–10.5)
nRBC: 0 % (ref 0.0–0.2)

## 2018-05-26 LAB — TROPONIN I: Troponin I: <0.03 ng/mL (ref <0.03)

## 2018-05-26 LAB — URINALYSIS, ROUTINE W REFLEX MICROSCOPIC
Bilirubin Urine: NEGATIVE
Glucose, UA: NEGATIVE mg/dL
Hgb urine dipstick: NEGATIVE
Ketones, ur: NEGATIVE mg/dL
Leukocytes, UA: NEGATIVE
Nitrite: NEGATIVE
Protein, ur: NEGATIVE mg/dL
SPECIFIC GRAVITY, URINE: 1.025 (ref 1.005–1.030)
pH: 6 (ref 5.0–8.0)

## 2018-05-26 LAB — BASIC METABOLIC PANEL
Anion gap: 6 (ref 5–15)
BUN: 11 mg/dL (ref 6–20)
CALCIUM: 9.2 mg/dL (ref 8.9–10.3)
CO2: 25 mmol/L (ref 22–32)
Chloride: 106 mmol/L (ref 98–111)
Creatinine, Ser: 0.76 mg/dL (ref 0.44–1.00)
GFR calc Af Amer: >60 mL/min (ref >60)
GFR calc non Af Amer: >60 mL/min (ref >60)
Glucose, Bld: 85 mg/dL (ref 70–99)
Potassium: 3.7 mmol/L (ref 3.5–5.1)
Sodium: 137 mmol/L (ref 135–145)

## 2018-05-26 LAB — PREGNANCY, URINE: Preg Test, Ur: NEGATIVE

## 2018-05-26 MED ORDER — IBUPROFEN 400 MG PO TABS
600.0000 mg | ORAL_TABLET | Freq: Once | ORAL | Status: AC
  Administered 2018-05-26: 600 mg via ORAL
  Filled 2018-05-26: qty 1

## 2018-05-26 MED ORDER — SODIUM CHLORIDE 0.9 % IV BOLUS
1000.0000 mL | Freq: Once | INTRAVENOUS | Status: AC
  Administered 2018-05-26: 1000 mL via INTRAVENOUS

## 2018-05-26 NOTE — Discharge Instructions (Addendum)
If you develop recurrent, continued, or worsening chest pain, shortness of breath, fever, vomiting, abdominal or back pain, or any other new/concerning symptoms then return to the ER for evaluation.  

## 2018-05-26 NOTE — ED Triage Notes (Addendum)
Pt reports intermittent left chest/rib pain x 1 week with radiation to left arm. Reports left arm aches and feels "numb and tingling". Diarrhea started today

## 2018-05-26 NOTE — ED Notes (Signed)
Patient transported to X-ray 

## 2018-05-26 NOTE — ED Provider Notes (Signed)
[facility] EMERGENCY DEPARTMENT Provider Note   CSN: [id number] Arrival date & time: 05/26/18  1542     History   Chief Complaint Chief Complaint  Patient presents with  . Chest Pain    HPI Gina Martin is a 28 y.o. female.  HPI  28 year old female presents with left-sided chest pain.  Started about a week ago.  It is intermittent but very frequent throughout the day.  Has a hard time describing exactly what it feels like but it comes and goes lasting about 10 minutes at a time.  Is in her left mid axillary line.  No obvious rash.  There is a little bit of pain in the back in the same area but no radiation of the pain.  Occasionally, she will have tingling throughout her entire left arm.  There is no shortness of breath.  She has developed a little bit of a cough over the last couple days.  She has also been having diarrhea starting yesterday with at least 5 bowel movements today.  No abdominal pain, diaphoresis, vomiting.  Has taken some aspirin which has partially helped.  Currently the pain is about a 6 out of 10.  No recent travel, surgery, leg swelling or leg pain.  No estrogen use.  Past Medical History:  Diagnosis Date  . Asthma   . Bipolar 1 disorder (HCC)   . Hyperlipidemia   . Preeclampsia   . Schizophrenia (HCC)     There are no active problems to display for this patient.   Past Surgical History:  Procedure Laterality Date  . CESAREAN SECTION    . TUBAL LIGATION       OB History    Gravida  3   Para  1   Term      Preterm      AB      Living        SAB      TAB      Ectopic      Multiple      Live Births               Home Medications    Prior to Admission medications   Medication Sig Start Date End Date Taking? Authorizing Provider  albuterol (PROVENTIL HFA;VENTOLIN HFA) 108 (90 Base) MCG/ACT inhaler Inhale 2 puffs into the lungs every 6 (six) hours as needed for wheezing or shortness of breath.   Yes [provider]  Multiple Vitamins-Minerals (MULTIVITAMIN ADULT PO) Take by mouth.   Yes [provider]  aspirin 81 MG chewable tablet Chew by mouth daily.    [provider]  cyclobenzaprine (FLEXERIL) 10 MG tablet Take 1 tablet (10 mg total) by mouth 2 (two) times daily as needed for muscle spasms. 09/03/17   Gwyneth SproutPlunkett, Whitney, MD  ibuprofen (ADVIL,MOTRIN) 600 MG tablet Take 1 tablet (600 mg total) by mouth every 6 (six) hours as needed. 01/28/18   Derwood KaplanNanavati, Ankit, MD  IRON PO Take by mouth.    [provider]  omeprazole (PRILOSEC) 20 MG capsule Take 1 capsule (20 mg total) by mouth daily. 09/03/17   Gwyneth SproutPlunkett, Whitney, MD  predniSONE (DELTASONE) 10 MG tablet Take 2 tablets (20 mg total) by mouth 2 (two) times daily with a meal. 10/19/17   Geoffery Lyonselo, Douglas, MD  Prenatal Vit-Fe Fumarate-FA (PRENATAL MULTIVITAMIN) TABS tablet Take 1 tablet by mouth daily at 12 noon.    [provider]  Sulfamethoxazole-Trimethoprim (SEPTRA PO) Take by mouth.  [provider]    Family History No family history on file.  Social History Social History   Tobacco Use  . Smoking status: Current Every Day Smoker    Packs/day: 0.50    Types: Cigarettes  . Smokeless tobacco: Never Used  Substance Use Topics  . Alcohol use: No  . Drug use: No     Allergies   Shellfish allergy   Review of Systems Review of Systems  Constitutional: Negative for fever.  Respiratory: Positive for cough. Negative for shortness of breath.   Cardiovascular: Positive for chest pain. Negative for leg swelling.  Gastrointestinal: Positive for diarrhea. Negative for abdominal pain, blood in stool and vomiting.  All other systems reviewed and are negative.    Physical Exam Updated Vital Signs BP 121/79 (BP Location: Right Arm)   Pulse 80   Temp 98.5 F (36.9 C) (Oral)   Resp 18   Ht 5\' 11"  (1.803 m)   Wt 108.4 kg   LMP 05/13/2018   SpO2 100%   BMI 33.33 kg/m   Physical  Exam Vitals signs and nursing note reviewed.  Constitutional:      Appearance: She is well-developed. She is obese.  HENT:     Head: Normocephalic and atraumatic.     Right Ear: External ear normal.     Left Ear: External ear normal.     Nose: Nose normal.  Eyes:     General:        Right eye: No discharge.        Left eye: No discharge.  Cardiovascular:     Rate and Rhythm: Normal rate and regular rhythm.     Pulses:          Radial pulses are 2+ on the right side and 2+ on the left side.     Heart sounds: Normal heart sounds.  Pulmonary:     Effort: Pulmonary effort is normal.     Breath sounds: Normal breath sounds.  Chest:     Chest wall: No tenderness.  Abdominal:     Palpations: Abdomen is soft.     Tenderness: There is no abdominal tenderness.  Musculoskeletal:     Right lower leg: She exhibits no tenderness. No edema.     Left lower leg: She exhibits no tenderness. No edema.  Skin:    General: Skin is warm and dry.     Findings: No rash.     Comments: No rash in the left chest wall  Neurological:     Mental Status: She is alert.  Psychiatric:        Mood and Affect: Mood is not anxious.      ED Treatments / Results  Labs (all labs ordered are listed, but only abnormal results are displayed) Labs Reviewed  BASIC METABOLIC PANEL  TROPONIN I  CBC WITH DIFFERENTIAL/PLATELET  PREGNANCY, URINE  URINALYSIS, ROUTINE W REFLEX MICROSCOPIC    EKG EKG Interpretation  Date/Time:  Saturday May 26 2018 15:55:29 EST Ventricular Rate:  87 PR Interval:  146 QRS Duration: 78 QT Interval:  340 QTC Calculation: 409 R Axis:   79 Text Interpretation:  Normal sinus rhythm no acute ST/T changes no significant change since Aug 2018 Confirmed by Pricilla LovelessGoldston, Bernise Sylvain [phone]([id number]) on 05/26/2018 4:03:22 PM   Radiology Dg Chest 2 View  Result Date: 05/26/2018 CLINICAL DATA:  Left-sided chest pain and shortness of breath. EXAM: CHEST - 2 VIEW COMPARISON:  01/28/2018 FINDINGS: The  heart size and mediastinal contours are  within normal limits. Both lungs are clear. The visualized skeletal structures are unremarkable. IMPRESSION: Normal study. Electronically Signed   By: Myles Rosenthal M.D.   On: 05/26/2018 17:17    Procedures Procedures (including critical care time)  Medications Ordered in ED Medications  sodium chloride 0.9 % bolus 1,000 mL (1,000 mLs Intravenous New Bag/Given 05/26/18 1655)  ibuprofen (ADVIL,MOTRIN) tablet 600 mg (600 mg Oral Given 05/26/18 1621)     Initial Impression / Assessment and Plan / ED Course  I have reviewed the triage vital signs and the nursing notes.  Pertinent labs & imaging results that were available during my care of the patient were reviewed by me and considered in my medical decision making (see chart for details).     No clear cause for the patient's left-sided chest pain.  Her vitals, ECG, chest x-ray are all benign.  She is well-appearing.  No significant risk factors for PE/DVT and has no tachycardia, dyspnea, or hypoxia.  I do not think further testing is needed.  Highly doubt ACS or dissection.  Will recommend ibuprofen and opposed to aspirin and have her follow-up with PCP.  We discussed return precautions.  Final Clinical Impressions(s) / ED Diagnoses   Final diagnoses:  Nonspecific chest pain    ED Discharge Orders    None       Pricilla Loveless, MD 05/26/18 1743

## 2018-05-26 NOTE — ED Notes (Addendum)
Pt c/o Pain under left arm, with tingling in left arm x 1 week intermittently. Pt denies vomiting or nausea, but is c/o diarrhea. Pt states she took bayer aspirin with some relief. Pt also c/o non productive cough

## 2018-05-26 NOTE — ED Notes (Signed)
ED Provider at bedside. 

## 2018-06-11 ENCOUNTER — Other Ambulatory Visit: Payer: Self-pay

## 2018-06-11 ENCOUNTER — Emergency Department (HOSPITAL_BASED_OUTPATIENT_CLINIC_OR_DEPARTMENT_OTHER)
Admission: EM | Admit: 2018-06-11 | Discharge: 2018-06-12 | Disposition: A | Discharge status: Home / Self Care | Payer: Medicaid Other | Attending: Emergency Medicine | Admitting: Emergency Medicine

## 2018-06-11 ENCOUNTER — Encounter (HOSPITAL_BASED_OUTPATIENT_CLINIC_OR_DEPARTMENT_OTHER): Payer: Self-pay | Admitting: *Deleted

## 2018-06-11 DIAGNOSIS — R200 Anesthesia of skin: Secondary | ICD-10-CM | POA: Insufficient documentation

## 2018-06-11 DIAGNOSIS — R002 Palpitations: Secondary | ICD-10-CM | POA: Diagnosis not present

## 2018-06-11 DIAGNOSIS — J45909 Unspecified asthma, uncomplicated: Secondary | ICD-10-CM | POA: Diagnosis not present

## 2018-06-11 DIAGNOSIS — Z79899 Other long term (current) drug therapy: Secondary | ICD-10-CM | POA: Diagnosis not present

## 2018-06-11 DIAGNOSIS — F419 Anxiety disorder, unspecified: Secondary | ICD-10-CM | POA: Insufficient documentation

## 2018-06-11 DIAGNOSIS — F17210 Nicotine dependence, cigarettes, uncomplicated: Secondary | ICD-10-CM | POA: Insufficient documentation

## 2018-06-11 DIAGNOSIS — R0602 Shortness of breath: Secondary | ICD-10-CM | POA: Insufficient documentation

## 2018-06-11 DIAGNOSIS — R51 Headache: Secondary | ICD-10-CM | POA: Insufficient documentation

## 2018-06-11 DIAGNOSIS — R0789 Other chest pain: Secondary | ICD-10-CM | POA: Diagnosis present

## 2018-06-11 DIAGNOSIS — Z7982 Long term (current) use of aspirin: Secondary | ICD-10-CM | POA: Diagnosis not present

## 2018-06-11 DIAGNOSIS — R202 Paresthesia of skin: Secondary | ICD-10-CM | POA: Diagnosis not present

## 2018-06-11 DIAGNOSIS — R42 Dizziness and giddiness: Secondary | ICD-10-CM | POA: Insufficient documentation

## 2018-06-11 LAB — BASIC METABOLIC PANEL
Anion gap: 6 (ref 5–15)
BUN: 12 mg/dL (ref 6–20)
CO2: 23 mmol/L (ref 22–32)
Calcium: 8.9 mg/dL (ref 8.9–10.3)
Chloride: 106 mmol/L (ref 98–111)
Creatinine, Ser: 0.71 mg/dL (ref 0.44–1.00)
GFR calc Af Amer: >60 mL/min (ref >60)
GFR calc non Af Amer: >60 mL/min (ref >60)
Glucose, Bld: 96 mg/dL (ref 70–99)
Potassium: 3.9 mmol/L (ref 3.5–5.1)
Sodium: 135 mmol/L (ref 135–145)

## 2018-06-11 LAB — CBC
HCT: 38.8 % (ref 36.0–46.0)
Hemoglobin: 12 g/dL (ref 12.0–15.0)
MCH: 26.5 pg (ref 26.0–34.0)
MCHC: 30.9 g/dL (ref 30.0–36.0)
MCV: 85.8 fL (ref 80.0–100.0)
Platelets: 319 10*3/uL (ref 150–400)
RBC: 4.52 MIL/uL (ref 3.87–5.11)
RDW: 14.1 % (ref 11.5–15.5)
WBC: 9.5 10*3/uL (ref 4.0–10.5)
nRBC: 0 % (ref 0.0–0.2)

## 2018-06-11 MED ORDER — ALUM & MAG HYDROXIDE-SIMETH 200-200-20 MG/5ML PO SUSP
30.0000 mL | Freq: Once | ORAL | Status: AC
  Administered 2018-06-11: 30 mL via ORAL
  Filled 2018-06-11: qty 30

## 2018-06-11 NOTE — ED Provider Notes (Addendum)
[facility] EMERGENCY DEPARTMENT Provider Note   CSN: [id number] Arrival date & time: 06/11/18  2002     History   Chief Complaint No chief complaint on file.   HPI Gina Martin is a 28 y.o. female who presents with left-sided body tingling and left-sided chest pain.  Past medical history significant for schizophrenia, bipolar disorder, asthma, hyperlipidemia.  She states that she was driving her cousin home earlier today and started having left-sided chest pain, her body felt "hot", and she had tingling of her left arm, lightheadedness, palpitations, and shortness of breath. The chest pain feels like a sharp sensation. She took Aspirin which she keeps in her car. She went home and took more aspirin.  She went to sleep and when she woke up and ate something the pain got worse again.  She also started having tingling in her left leg and foot.  She reports headache as well.  No neck pain, fevers, loss of consciousness, abdominal pain, vomiting, urinary symptoms.  She was evaluated for this several days ago in the emergency department and it was thought to be atypical.  She states that when she gets the symptoms she becomes very anxious and she feels like she cannot swallow.  Her family members urged her to come to the emergency department to make sure she was not having a heart attack. No recent surgery/travel/immobilization, hx of cancer, leg swelling, hemoptysis, prior DVT/PE, or hormone use.  HPI  Past Medical History:  Diagnosis Date  . Asthma   . Bipolar 1 disorder (HCC)   . Hyperlipidemia   . Preeclampsia   . Schizophrenia (HCC)     There are no active problems to display for this patient.   Past Surgical History:  Procedure Laterality Date  . CESAREAN SECTION    . TUBAL LIGATION       OB History    Gravida  3   Para  1   Term      Preterm      AB      Living        SAB      TAB      Ectopic      Multiple      Live Births                Home Medications    Prior to Admission medications   Medication Sig Start Date End Date Taking? Authorizing Provider  albuterol (PROVENTIL HFA;VENTOLIN HFA) 108 (90 Base) MCG/ACT inhaler Inhale 2 puffs into the lungs every 6 (six) hours as needed for wheezing or shortness of breath.    [provider]  aspirin 81 MG chewable tablet Chew by mouth daily.    [provider]  cyclobenzaprine (FLEXERIL) 10 MG tablet Take 1 tablet (10 mg total) by mouth 2 (two) times daily as needed for muscle spasms. 09/03/17   Gwyneth SproutPlunkett, Whitney, MD  ibuprofen (ADVIL,MOTRIN) 600 MG tablet Take 1 tablet (600 mg total) by mouth every 6 (six) hours as needed. 01/28/18   Derwood KaplanNanavati, Ankit, MD  IRON PO Take by mouth.    [provider]  Multiple Vitamins-Minerals (MULTIVITAMIN ADULT PO) Take by mouth.    [provider]  omeprazole (PRILOSEC) 20 MG capsule Take 1 capsule (20 mg total) by mouth daily. 09/03/17   Gwyneth SproutPlunkett, Whitney, MD  predniSONE (DELTASONE) 10 MG tablet Take 2 tablets (20 mg total) by mouth 2 (two) times daily with a meal. 10/19/17   Delo, Riley Lamouglas,  MD  Prenatal Vit-Fe Fumarate-FA (PRENATAL MULTIVITAMIN) TABS tablet Take 1 tablet by mouth daily at 12 noon.    [provider]  Sulfamethoxazole-Trimethoprim (SEPTRA PO) Take by mouth.    [provider]    Family History No family history on file.  Social History Social History   Tobacco Use  . Smoking status: Current Every Day Smoker    Packs/day: 0.50    Types: Cigarettes  . Smokeless tobacco: Never Used  Substance Use Topics  . Alcohol use: No  . Drug use: No     Allergies   Shellfish allergy   Review of Systems Review of Systems  Constitutional: Negative for fever.  Respiratory: Positive for cough and shortness of breath (resolved).   Cardiovascular: Positive for chest pain (left sided). Negative for palpitations and leg swelling.  Gastrointestinal: Negative for abdominal pain,  nausea and vomiting.  Genitourinary: Negative for dysuria.  Neurological: Positive for light-headedness and numbness. Negative for syncope.  Psychiatric/Behavioral: The patient is nervous/anxious.   All other systems reviewed and are negative.    Physical Exam Updated Vital Signs BP 119/81   Pulse 80   Temp 98.8 F (37.1 C) (Oral)   Resp 19   Ht 5\' 11"  (1.803 m)   Wt 108.4 kg   LMP 05/13/2018   SpO2 100%   BMI 33.33 kg/m   Physical Exam Vitals signs and nursing note reviewed.  Constitutional:      General: She is not in acute distress.    Appearance: She is well-developed.     Comments: Calm and cooperative  HENT:     Head: Normocephalic and atraumatic.  Eyes:     General: No scleral icterus.       Right eye: No discharge.        Left eye: No discharge.     Conjunctiva/sclera: Conjunctivae normal.     Pupils: Pupils are equal, round, and reactive to light.  Neck:     Musculoskeletal: Normal range of motion.  Cardiovascular:     Rate and Rhythm: Normal rate and regular rhythm.  Pulmonary:     Effort: Pulmonary effort is normal. No respiratory distress.     Breath sounds: Normal breath sounds.  Abdominal:     General: There is no distension.     Palpations: Abdomen is soft.     Tenderness: There is no abdominal tenderness.  Musculoskeletal:     Right lower leg: No edema.     Left lower leg: No edema.     Comments: 5/5 strength in upper and lower extremities. 2+ radial pulses bilaterally. Symptoms are increased with compression of the wrist  Skin:    General: Skin is warm and dry.  Neurological:     Mental Status: She is alert and oriented to person, place, and time.  Psychiatric:        Behavior: Behavior normal.      ED Treatments / Results  Labs (all labs ordered are listed, but only abnormal results are displayed) Labs Reviewed  BASIC METABOLIC PANEL  CBC    EKG EKG Interpretation  Date/Time:  Monday June 11 2018 20:28:39 EST Ventricular  Rate:  94 PR Interval:  144 QRS Duration: 76 QT Interval:  348 QTC Calculation: 435 R Axis:   71 Text Interpretation:  Normal sinus rhythm Normal ECG No significant change since last tracing Confirmed by Alvira Monday ([id number]) on 06/11/2018 10:35:13 PM   Radiology No results found.  Procedures Procedures (including critical care time)  Medications Ordered in ED Medications  alum & mag hydroxide-simeth (MAALOX/MYLANTA) 200-200-20 MG/5ML suspension 30 mL (30 mLs Oral Given 06/11/18 2326)     Initial Impression / Assessment and Plan / ED Course  I have reviewed the triage vital signs and the nursing notes.  Pertinent labs & imaging results that were available during my care of the patient were reviewed by me and considered in my medical decision making (see chart for details).  28 year old female presents with chest pain and tingling of her L arm. She is concerned about a heart attack. This is very unlikely given her age and the symptoms are atypical. She is PERC negative - doubt PE. EKG is NSR. Labs are normal. Suspect GI etiology vs anxiety. She is sleeping on re-eval. Discussed results. She has a f/u with her PCP at the end of the month.  Final Clinical Impressions(s) / ED Diagnoses   Final diagnoses:  Atypical chest pain  Numbness and tingling of left arm and leg    ED Discharge Orders    None       Bethel Born, PA-C 06/12/18 0017    Bethel Born, PA-C 06/12/18 0019    Alvira Monday, MD 06/16/18 [phone]

## 2018-06-11 NOTE — ED Triage Notes (Signed)
Tingling in the left side of her body off and on since yesterday. She was seen for same a month ago.

## 2018-06-12 NOTE — Discharge Instructions (Signed)
Please try medicine for acid reflux Follow up with your doctor Return if worsening

## 2018-06-12 NOTE — ED Notes (Signed)
PT states understanding of care given, follow up care. PT ambulated from ED to car with a steady gait.  

## 2019-01-15 ENCOUNTER — Other Ambulatory Visit: Payer: Self-pay

## 2019-01-15 ENCOUNTER — Encounter (HOSPITAL_BASED_OUTPATIENT_CLINIC_OR_DEPARTMENT_OTHER): Payer: Self-pay | Admitting: Emergency Medicine

## 2019-01-15 ENCOUNTER — Emergency Department (HOSPITAL_BASED_OUTPATIENT_CLINIC_OR_DEPARTMENT_OTHER)
Admission: EM | Admit: 2019-01-15 | Discharge: 2019-01-15 | Disposition: A | Discharge status: Home / Self Care | Payer: Medicaid Other | Attending: Emergency Medicine | Admitting: Emergency Medicine

## 2019-01-15 DIAGNOSIS — F10920 Alcohol use, unspecified with intoxication, uncomplicated: Secondary | ICD-10-CM | POA: Insufficient documentation

## 2019-01-15 DIAGNOSIS — Y906 Blood alcohol level of 120-199 mg/100 ml: Secondary | ICD-10-CM | POA: Diagnosis not present

## 2019-01-15 DIAGNOSIS — Z79899 Other long term (current) drug therapy: Secondary | ICD-10-CM | POA: Diagnosis not present

## 2019-01-15 DIAGNOSIS — F17210 Nicotine dependence, cigarettes, uncomplicated: Secondary | ICD-10-CM | POA: Insufficient documentation

## 2019-01-15 DIAGNOSIS — Z7982 Long term (current) use of aspirin: Secondary | ICD-10-CM | POA: Insufficient documentation

## 2019-01-15 DIAGNOSIS — J45909 Unspecified asthma, uncomplicated: Secondary | ICD-10-CM | POA: Diagnosis not present

## 2019-01-15 LAB — CBC
HCT: 39.1 % (ref 36.0–46.0)
Hemoglobin: 12.4 g/dL (ref 12.0–15.0)
MCH: 27.7 pg (ref 26.0–34.0)
MCHC: 31.7 g/dL (ref 30.0–36.0)
MCV: 87.3 fL (ref 80.0–100.0)
Platelets: 363 10*3/uL (ref 150–400)
RBC: 4.48 MIL/uL (ref 3.87–5.11)
RDW: 14.1 % (ref 11.5–15.5)
WBC: 8.6 10*3/uL (ref 4.0–10.5)
nRBC: 0 % (ref 0.0–0.2)

## 2019-01-15 LAB — COMPREHENSIVE METABOLIC PANEL
ALT: 14 U/L (ref 0–44)
AST: 15 U/L (ref 15–41)
Albumin: 3.9 g/dL (ref 3.5–5.0)
Alkaline Phosphatase: 73 U/L (ref 38–126)
Anion gap: 10 (ref 5–15)
BUN: 9 mg/dL (ref 6–20)
CO2: 21 mmol/L — ABNORMAL LOW (ref 22–32)
Calcium: 8.9 mg/dL (ref 8.9–10.3)
Chloride: 109 mmol/L (ref 98–111)
Creatinine, Ser: 0.87 mg/dL (ref 0.44–1.00)
GFR calc Af Amer: >60 mL/min (ref >60)
GFR calc non Af Amer: >60 mL/min (ref >60)
Glucose, Bld: 114 mg/dL — ABNORMAL HIGH (ref 70–99)
Potassium: 3.5 mmol/L (ref 3.5–5.1)
Sodium: 140 mmol/L (ref 135–145)
Total Bilirubin: 0.3 mg/dL (ref 0.3–1.2)
Total Protein: 7.3 g/dL (ref 6.5–8.1)

## 2019-01-15 LAB — HCG, QUANTITATIVE, PREGNANCY: hCG, Beta Chain, Quant, S: <1 m[IU]/mL (ref <5)

## 2019-01-15 LAB — ETHANOL: Alcohol, Ethyl (B): 152 mg/dL — ABNORMAL HIGH (ref <10)

## 2019-01-15 NOTE — ED Triage Notes (Signed)
Pt arrives via EMS from home, reports she has been drinking all day d/t to her deceased father's birthday. Unable to report how much she's had to drink. EMS noted positive orthostatics, so they initiated 500ML NS bolus. Given 8MG  IV zofran. Pt unsteady on her feet, unable to ambulate safely.

## 2019-01-15 NOTE — ED Provider Notes (Signed)
[facility] EMERGENCY DEPARTMENT Provider Note   CSN: [id number] Arrival date & time: 01/15/19  0040     History   Chief Complaint Chief Complaint  Patient presents with  . Alcohol Intoxication    HPI Gina Martin is a 28 y.o. female.     The history is provided by the patient and a relative.  Alcohol Intoxication This is a new problem. The current episode started 12 to 24 hours ago. The problem occurs constantly. The problem has not changed since onset.Pertinent negatives include no chest pain, no abdominal pain, no headaches and no shortness of breath. Nothing aggravates the symptoms. Nothing relieves the symptoms. She has tried nothing for the symptoms. The treatment provided moderate relief.  Drinking all day due to deceased loved ones birthday.  No LOC. No trauma.    Past Medical History:  Diagnosis Date  . Asthma   . Bipolar 1 disorder (HCC)   . Hyperlipidemia   . Preeclampsia   . Schizophrenia (HCC)     There are no active problems to display for this patient.   Past Surgical History:  Procedure Laterality Date  . CESAREAN SECTION    . TUBAL LIGATION       OB History    Gravida  3   Para  1   Term      Preterm      AB      Living        SAB      TAB      Ectopic      Multiple      Live Births               Home Medications    Prior to Admission medications   Medication Sig Start Date End Date Taking? Authorizing Provider  albuterol (PROVENTIL HFA;VENTOLIN HFA) 108 (90 Base) MCG/ACT inhaler Inhale 2 puffs into the lungs every 6 (six) hours as needed for wheezing or shortness of breath.    [provider]  aspirin 81 MG chewable tablet Chew by mouth daily.    [provider]  cyclobenzaprine (FLEXERIL) 10 MG tablet Take 1 tablet (10 mg total) by mouth 2 (two) times daily as needed for muscle spasms. 09/03/17   Gwyneth SproutPlunkett, Whitney, MD  ibuprofen (ADVIL,MOTRIN) 600 MG tablet Take 1 tablet (600 mg total) by  mouth every 6 (six) hours as needed. 01/28/18   Derwood KaplanNanavati, Ankit, MD  IRON PO Take by mouth.    [provider]  Multiple Vitamins-Minerals (MULTIVITAMIN ADULT PO) Take by mouth.    [provider]  omeprazole (PRILOSEC) 20 MG capsule Take 1 capsule (20 mg total) by mouth daily. 09/03/17   Gwyneth SproutPlunkett, Whitney, MD  predniSONE (DELTASONE) 10 MG tablet Take 2 tablets (20 mg total) by mouth 2 (two) times daily with a meal. 10/19/17   Geoffery Lyonselo, Douglas, MD  Prenatal Vit-Fe Fumarate-FA (PRENATAL MULTIVITAMIN) TABS tablet Take 1 tablet by mouth daily at 12 noon.    [provider]  Sulfamethoxazole-Trimethoprim (SEPTRA PO) Take by mouth.    [provider]    Family History History reviewed. No pertinent family history.  Social History Social History   Tobacco Use  . Smoking status: Current Every Day Smoker    Packs/day: 0.50    Types: Cigarettes  . Smokeless tobacco: Never Used  Substance Use Topics  . Alcohol use: No  . Drug use: No     Allergies   Shellfish allergy   Review of  Systems Review of Systems  Constitutional: Negative for fever.  Eyes: Negative for photophobia.  Respiratory: Negative for cough and shortness of breath.   Cardiovascular: Negative for chest pain.  Gastrointestinal: Negative for abdominal pain.  Genitourinary: Negative for difficulty urinating.  Musculoskeletal: Negative for arthralgias.  Neurological: Negative for headaches.  Psychiatric/Behavioral: Negative for agitation and suicidal ideas.  All other systems reviewed and are negative.    Physical Exam Updated Vital Signs BP 116/68 (BP Location: Right Arm)   Pulse 86   Temp 98.5 F (36.9 C) (Oral)   Resp 16   LMP 01/11/2019   SpO2 100%   Physical Exam Vitals signs and nursing note reviewed.  Constitutional:      General: She is not in acute distress.    Appearance: She is normal weight.  HENT:     Head: Normocephalic and atraumatic.     Nose: Nose normal.   Eyes:     Conjunctiva/sclera: Conjunctivae normal.     Pupils: Pupils are equal, round, and reactive to light.  Neck:     Musculoskeletal: Normal range of motion and neck supple.  Cardiovascular:     Rate and Rhythm: Normal rate and regular rhythm.     Pulses: Normal pulses.     Heart sounds: Normal heart sounds.  Pulmonary:     Effort: Pulmonary effort is normal.     Breath sounds: Normal breath sounds.  Abdominal:     General: Abdomen is flat. Bowel sounds are normal.     Tenderness: There is no abdominal tenderness. There is no guarding.  Musculoskeletal: Normal range of motion.  Skin:    General: Skin is warm and dry.     Capillary Refill: Capillary refill takes less than 2 seconds.  Neurological:     General: No focal deficit present.     Mental Status: She is alert and oriented to person, place, and time.  Psychiatric:        Mood and Affect: Mood normal.        Behavior: Behavior normal.      ED Treatments / Results  Labs (all labs ordered are listed, but only abnormal results are displayed) Results for orders placed or performed during the hospital encounter of 01/15/19  Comprehensive metabolic panel  Result Value Ref Range   Sodium 140 135 - 145 mmol/L   Potassium 3.5 3.5 - 5.1 mmol/L   Chloride 109 98 - 111 mmol/L   CO2 21 (L) 22 - 32 mmol/L   Glucose, Bld 114 (H) 70 - 99 mg/dL   BUN 9 6 - 20 mg/dL   Creatinine, Ser 4.08 0.44 - 1.00 mg/dL   Calcium 8.9 8.9 - 14.4 mg/dL   Total Protein 7.3 6.5 - 8.1 g/dL   Albumin 3.9 3.5 - 5.0 g/dL   AST 15 15 - 41 U/L   ALT 14 0 - 44 U/L   Alkaline Phosphatase 73 38 - 126 U/L   Total Bilirubin 0.3 0.3 - 1.2 mg/dL   GFR calc non Af Amer >60 >60 mL/min   GFR calc Af Amer >60 >60 mL/min   Anion gap 10 5 - 15  Ethanol  Result Value Ref Range   Alcohol, Ethyl (B) 152 (H) <10 mg/dL  cbc  Result Value Ref Range   WBC 8.6 4.0 - 10.5 K/uL   RBC 4.48 3.87 - 5.11 MIL/uL   Hemoglobin 12.4 12.0 - 15.0 g/dL   HCT 81.8 56.3 -  14.9 %   MCV 87.3  80.0 - 100.0 fL   MCH 27.7 26.0 - 34.0 pg   MCHC 31.7 30.0 - 36.0 g/dL   RDW 14.1 11.5 - 15.5 %   Platelets 363 150 - 400 K/uL   nRBC 0.0 0.0 - 0.2 %  hCG, quantitative, pregnancy  Result Value Ref Range   hCG, Beta Chain, Quant, S <1 <5 mIU/mL   No results found.  Radiology No results found.  Procedures Procedures (including critical care time)  Medications Ordered in ED Medications - No data to display   PO challenged successfully.  No signs of DTs.  Well appearing.  No complications.  Stable for discharge.   Gina Martin was evaluated in Emergency Department on 01/15/2019 for the symptoms described in the history of present illness. She was evaluated in the context of the global COVID-19 pandemic, which necessitated consideration that the patient might be at risk for infection with the SARS-CoV-2 virus that causes COVID-19. Institutional protocols and algorithms that pertain to the evaluation of patients at risk for COVID-19 are in a state of rapid change based on information released by regulatory bodies including the CDC and federal and state organizations. These policies and algorithms were followed during the patient's care in the ED.   Final Clinical Impressions(s) / ED Diagnoses   Final diagnoses:  Alcoholic intoxication without complication ([place])    Return for intractable cough, coughing up blood,fevers >100.4 unrelieved by medication, shortness of breath, intractable vomiting, chest pain, shortness of breath, weakness,numbness, changes in speech, facial asymmetry,abdominal pain, passing out,Inability to tolerate liquids or food, cough, altered mental status or any concerns. No signs of systemic illness or infection. The patient is nontoxic-appearing on exam and vital signs are within normal limits.   I have reviewed the triage vital signs and the nursing notes. Pertinent labs &imaging results that were available during my care of the  patient were reviewed by me and considered in my medical decision making (see chart for details).After history, exam, and medical workup I feel the patient has beenappropriately medically screened and is safe for discharge home. Pertinent diagnoses were discussed with the patient. Patient was given return precautions.     Jahmya Onofrio, MD 01/15/19 0345

## 2019-01-15 NOTE — ED Notes (Signed)
Passed PO challenge, pt is ambulatory.

## 2019-05-06 IMAGING — CR DG CHEST 2V
2 series · 2 of 2 positions shown · non-contrast
Comparison: 01/28/2018

CLINICAL DATA: Left-sided chest pain and shortness of breath.

EXAM:
CHEST - 2 VIEW

[w chest pa]
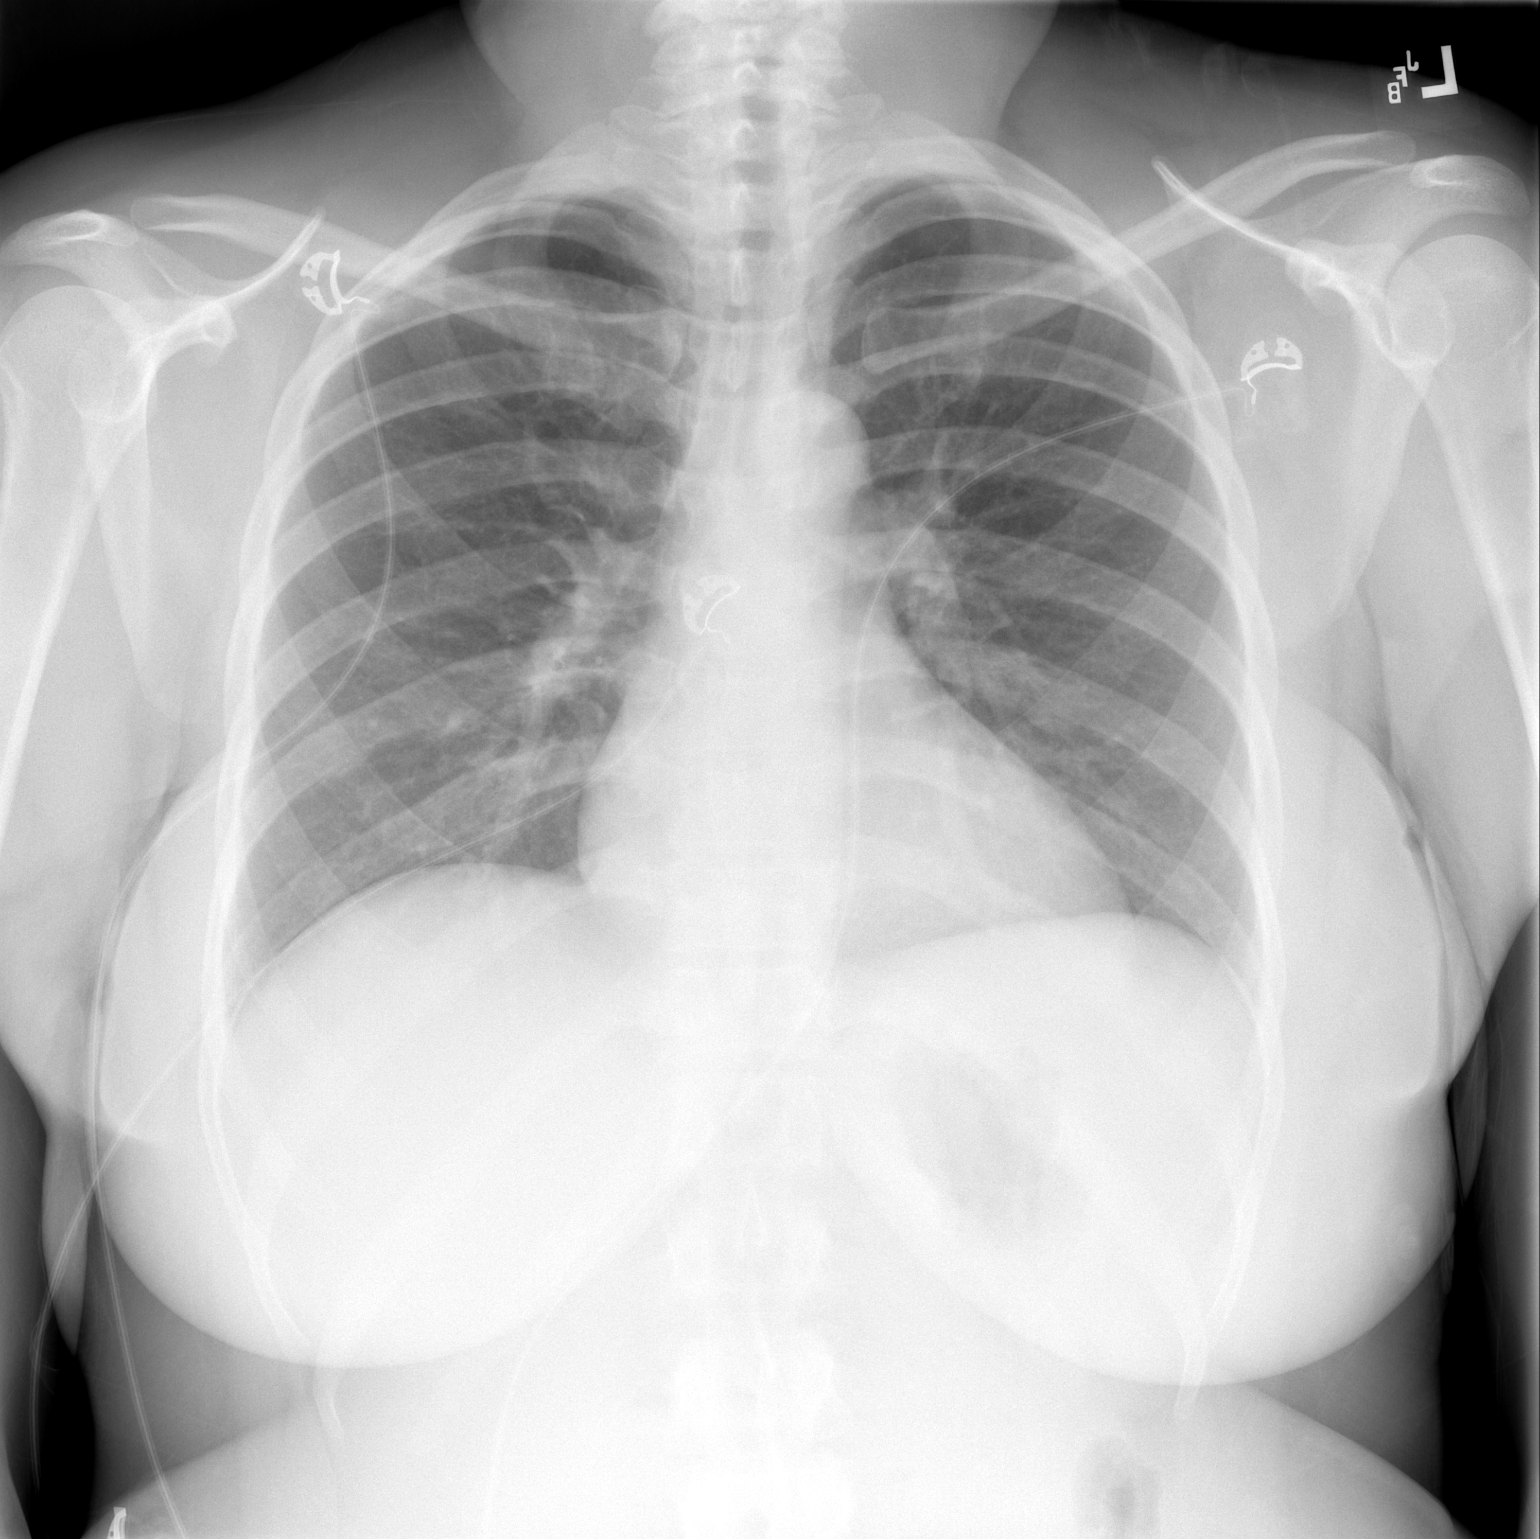

[w chest lat]
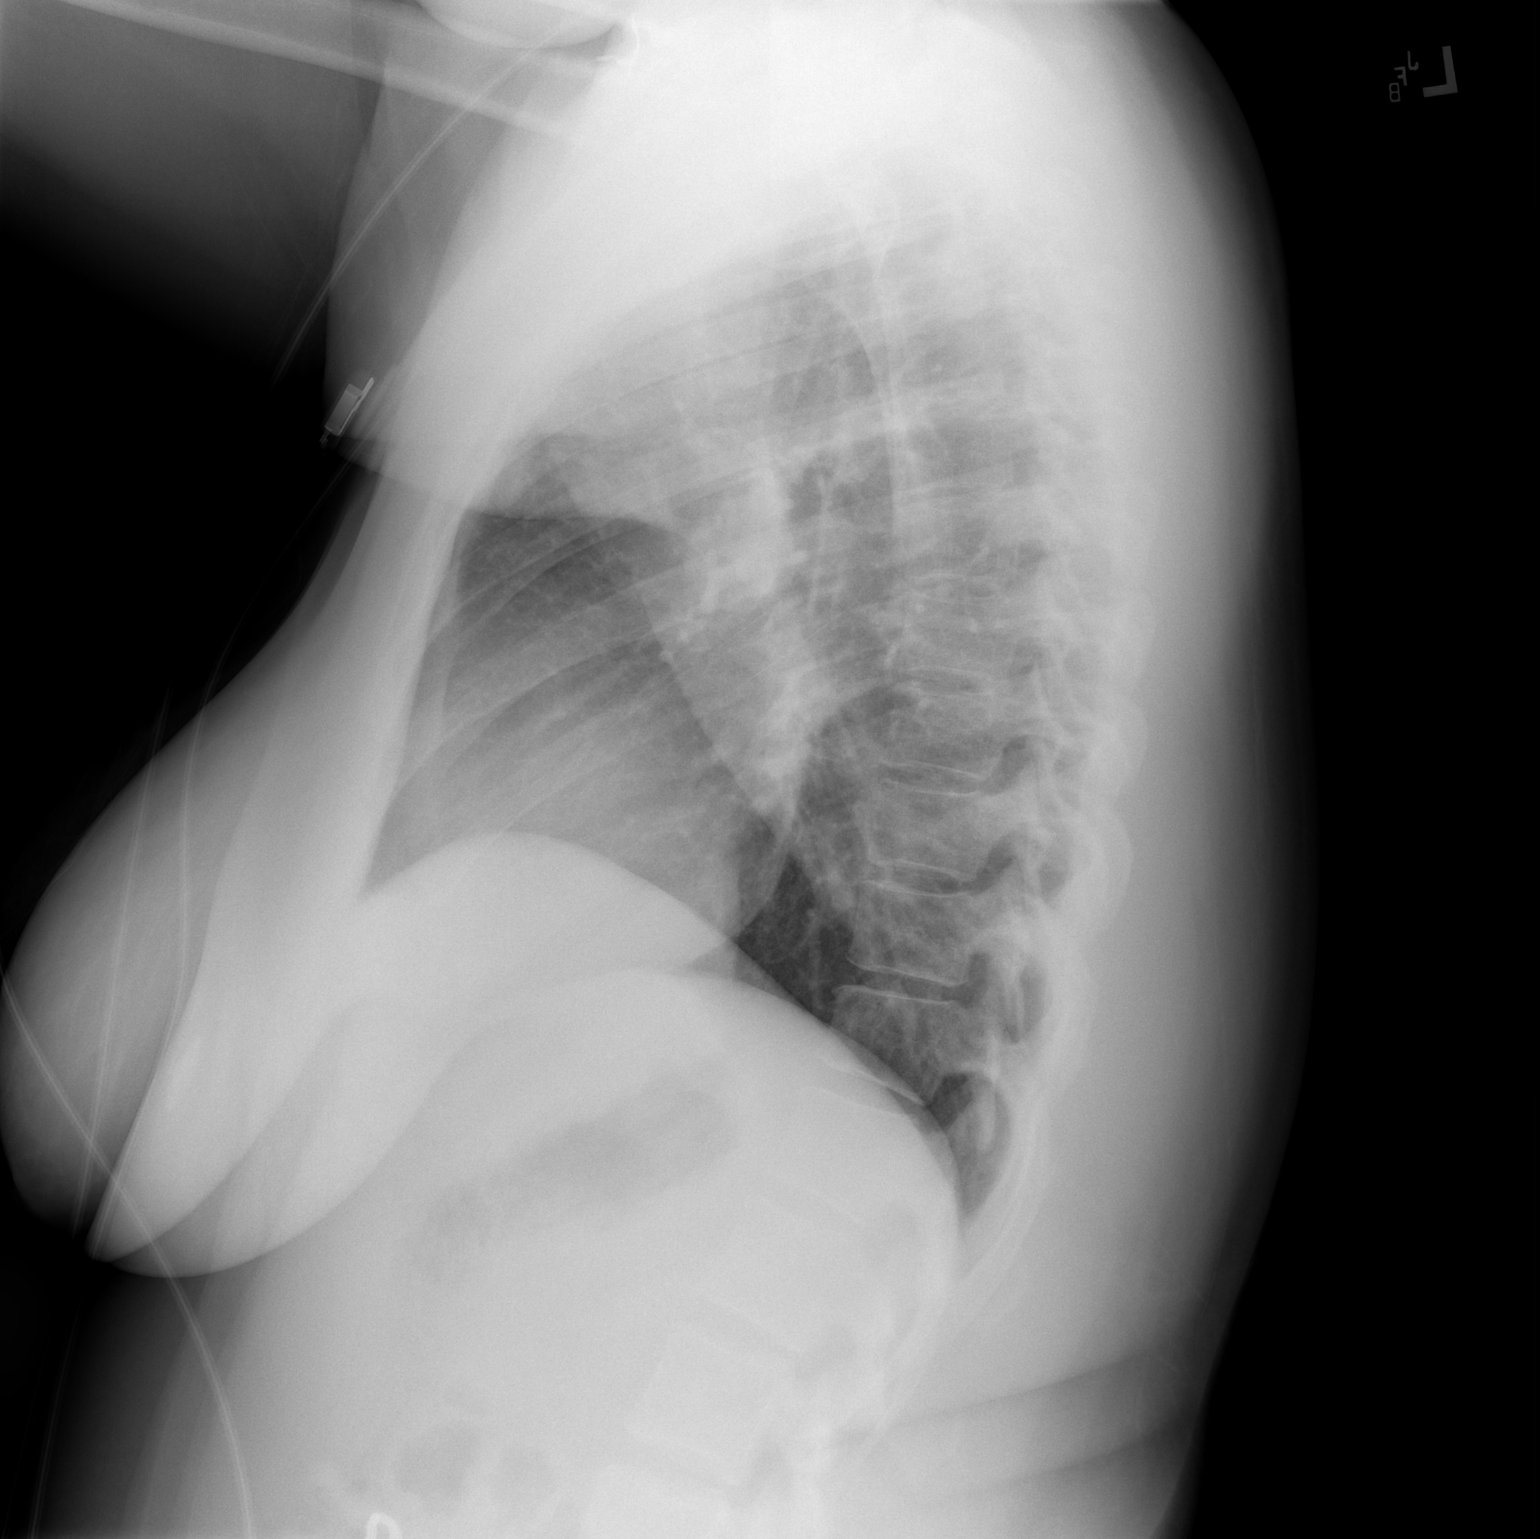

[2 of 2 positions shown; findings below may reference images not displayed]

FINDINGS: The heart size and mediastinal contours are within normal limits.
Both lungs are clear. The visualized skeletal structures are
unremarkable.
IMPRESSION: Normal study.

## 2019-05-21 ENCOUNTER — Other Ambulatory Visit: Payer: Self-pay

## 2019-05-21 ENCOUNTER — Encounter (HOSPITAL_BASED_OUTPATIENT_CLINIC_OR_DEPARTMENT_OTHER): Payer: Self-pay

## 2019-05-21 ENCOUNTER — Emergency Department (HOSPITAL_BASED_OUTPATIENT_CLINIC_OR_DEPARTMENT_OTHER)
Admission: EM | Admit: 2019-05-21 | Discharge: 2019-05-22 | Disposition: A | Discharge status: Home / Self Care | Payer: Medicaid Other | Attending: Emergency Medicine | Admitting: Emergency Medicine

## 2019-05-21 ENCOUNTER — Emergency Department (HOSPITAL_BASED_OUTPATIENT_CLINIC_OR_DEPARTMENT_OTHER): Payer: Medicaid Other

## 2019-05-21 DIAGNOSIS — F419 Anxiety disorder, unspecified: Secondary | ICD-10-CM | POA: Diagnosis not present

## 2019-05-21 DIAGNOSIS — Z79899 Other long term (current) drug therapy: Secondary | ICD-10-CM | POA: Insufficient documentation

## 2019-05-21 DIAGNOSIS — Z91013 Allergy to seafood: Secondary | ICD-10-CM | POA: Diagnosis not present

## 2019-05-21 DIAGNOSIS — J45909 Unspecified asthma, uncomplicated: Secondary | ICD-10-CM | POA: Insufficient documentation

## 2019-05-21 DIAGNOSIS — F17210 Nicotine dependence, cigarettes, uncomplicated: Secondary | ICD-10-CM | POA: Diagnosis not present

## 2019-05-21 DIAGNOSIS — R0789 Other chest pain: Secondary | ICD-10-CM | POA: Diagnosis present

## 2019-05-21 DIAGNOSIS — Z7982 Long term (current) use of aspirin: Secondary | ICD-10-CM | POA: Insufficient documentation

## 2019-05-21 LAB — CBC
HCT: 40.8 % (ref 36.0–46.0)
Hemoglobin: 13.1 g/dL (ref 12.0–15.0)
MCH: 27.7 pg (ref 26.0–34.0)
MCHC: 32.1 g/dL (ref 30.0–36.0)
MCV: 86.3 fL (ref 80.0–100.0)
Platelets: 343 10*3/uL (ref 150–400)
RBC: 4.73 MIL/uL (ref 3.87–5.11)
RDW: 13.9 % (ref 11.5–15.5)
WBC: 8.2 10*3/uL (ref 4.0–10.5)
nRBC: 0 % (ref 0.0–0.2)

## 2019-05-21 LAB — BASIC METABOLIC PANEL
Anion gap: 7 (ref 5–15)
BUN: 13 mg/dL (ref 6–20)
CO2: 25 mmol/L (ref 22–32)
Calcium: 9.5 mg/dL (ref 8.9–10.3)
Chloride: 104 mmol/L (ref 98–111)
Creatinine, Ser: 0.8 mg/dL (ref 0.44–1.00)
GFR calc Af Amer: >60 mL/min (ref >60)
GFR calc non Af Amer: >60 mL/min (ref >60)
Glucose, Bld: 96 mg/dL (ref 70–99)
Potassium: 4 mmol/L (ref 3.5–5.1)
Sodium: 136 mmol/L (ref 135–145)

## 2019-05-21 LAB — TROPONIN I (HIGH SENSITIVITY)
Troponin I (High Sensitivity): <2 ng/L (ref <18)
Troponin I (High Sensitivity): <2 ng/L (ref <18)

## 2019-05-21 LAB — PREGNANCY, URINE: Preg Test, Ur: NEGATIVE

## 2019-05-21 MED ORDER — ALUM & MAG HYDROXIDE-SIMETH 200-200-20 MG/5ML PO SUSP
30.0000 mL | Freq: Once | ORAL | Status: AC
  Administered 2019-05-21: 23:00:00 30 mL via ORAL
  Filled 2019-05-21: qty 30

## 2019-05-21 MED ORDER — KETOROLAC TROMETHAMINE 15 MG/ML IJ SOLN
15.0000 mg | Freq: Once | INTRAMUSCULAR | Status: AC
  Administered 2019-05-21: 15 mg via INTRAVENOUS
  Filled 2019-05-21: qty 1

## 2019-05-21 MED ORDER — OMEPRAZOLE 20 MG PO CPDR: 20.0000 mg | DELAYED_RELEASE_CAPSULE | Freq: Every day | ORAL | 0 refills | Status: DC

## 2019-05-21 MED ORDER — SODIUM CHLORIDE 0.9% FLUSH
3.0000 mL | Freq: Once | INTRAVENOUS | Status: DC
  Filled 2019-05-21: qty 3

## 2019-05-21 NOTE — ED Triage Notes (Signed)
Pt c/o CP x 3 days-denies fever/flu like sx-NAD-steady gait

## 2019-05-21 NOTE — Discharge Instructions (Signed)
Start Omeprazole - take once daily  Take Tylenol as needed for pain Please follow up with your doctor and therapist

## 2019-05-21 NOTE — ED Provider Notes (Signed)
[facility] EMERGENCY DEPARTMENT Provider Note   CSN: [id number] Arrival date & time: 05/21/19  1924     History Chief Complaint  Patient presents with  . Chest Pain    Gina Martin is a 29 y.o. female with history of asthma and bipolar d/o who presents with chest pain.  Patient states for the past 3 days she has had intermittent left-sided chest pain.  Occurs at random.  Will radiate down her left arm into her jaw and to her back at times.  She states that her arm will feel heavy and numb.  Pain in her chest feels like a tightness.  Pain is worse when she lies on that side and sometimes she will feel like she has to cough or throw up.  She denies fever, chills, shortness of breath, wheezing, cough, abdominal pain, nausea or vomiting. No recent surgery/travel/immobilization, hx of cancer, leg swelling, hemoptysis, prior DVT/PE, or hormone use.  She states that she has triplets at home and when she has the symptoms she starts to develop a panic attack and she will feel like she is going to die.  HPI     Past Medical History:  Diagnosis Date  . Asthma   . Bipolar 1 disorder (HCC)   . Hyperlipidemia   . Preeclampsia   . Schizophrenia (HCC)     There are no problems to display for this patient.   Past Surgical History:  Procedure Laterality Date  . CESAREAN SECTION    . TUBAL LIGATION       OB History    Gravida  3   Para  1   Term      Preterm      AB      Living        SAB      TAB      Ectopic      Multiple      Live Births              No family history on file.  Social History   Tobacco Use  . Smoking status: Current Every Day Smoker    Packs/day: 0.50    Types: Cigarettes  . Smokeless tobacco: Never Used  Substance Use Topics  . Alcohol use: Yes    Comment: occ  . Drug use: No    Home Medications Prior to Admission medications   Medication Sig Start Date End Date Taking? Authorizing Provider  albuterol (PROVENTIL  HFA;VENTOLIN HFA) 108 (90 Base) MCG/ACT inhaler Inhale 2 puffs into the lungs every 6 (six) hours as needed for wheezing or shortness of breath.    [provider]  aspirin 81 MG chewable tablet Chew by mouth daily.    [provider]  cyclobenzaprine (FLEXERIL) 10 MG tablet Take 1 tablet (10 mg total) by mouth 2 (two) times daily as needed for muscle spasms. 09/03/17   Gwyneth Sprout, MD  ibuprofen (ADVIL,MOTRIN) 600 MG tablet Take 1 tablet (600 mg total) by mouth every 6 (six) hours as needed. 01/28/18   Derwood Kaplan, MD  IRON PO Take by mouth.    [provider]  Multiple Vitamins-Minerals (MULTIVITAMIN ADULT PO) Take by mouth.    [provider]  omeprazole (PRILOSEC) 20 MG capsule Take 1 capsule (20 mg total) by mouth daily. 09/03/17   Gwyneth Sprout, MD  predniSONE (DELTASONE) 10 MG tablet Take 2 tablets (20 mg total) by mouth 2 (two) times daily with a meal. 10/19/17  Geoffery Lyons, MD  Prenatal Vit-Fe Fumarate-FA (PRENATAL MULTIVITAMIN) TABS tablet Take 1 tablet by mouth daily at 12 noon.    [provider]  Sulfamethoxazole-Trimethoprim (SEPTRA PO) Take by mouth.    [provider]    Allergies    Shellfish allergy  Review of Systems   Review of Systems  Constitutional: Negative for chills and fever.  Respiratory: Positive for chest tightness. Negative for cough, shortness of breath and wheezing.   Cardiovascular: Positive for chest pain. Negative for palpitations and leg swelling.  Gastrointestinal: Negative for abdominal pain, nausea and vomiting.  Musculoskeletal: Negative for neck pain.  Neurological: Positive for numbness. Negative for weakness.  All other systems reviewed and are negative.   Physical Exam Updated Vital Signs BP 121/73 (BP Location: Left Arm)   Pulse 93   Temp 99.3 F (37.4 C) (Oral)   Resp 20   Ht 5\' 11"  (1.803 m)   Wt 104.8 kg   SpO2 100%   BMI 32.22 kg/m   Physical Exam Vitals and  nursing note reviewed.  Constitutional:      General: She is not in acute distress.    Appearance: She is well-developed. She is not ill-appearing.  HENT:     Head: Normocephalic and atraumatic.  Eyes:     General: No scleral icterus.       Right eye: No discharge.        Left eye: No discharge.     Conjunctiva/sclera: Conjunctivae normal.     Pupils: Pupils are equal, round, and reactive to light.  Neck:     Comments: No midline tenderness Cardiovascular:     Rate and Rhythm: Normal rate and regular rhythm.  Pulmonary:     Effort: Pulmonary effort is normal. No respiratory distress.     Breath sounds: Normal breath sounds.  Abdominal:     General: There is no distension.     Palpations: Abdomen is soft.     Tenderness: There is no abdominal tenderness.  Musculoskeletal:     Cervical back: Normal range of motion.  Skin:    General: Skin is warm and dry.  Neurological:     Mental Status: She is alert and oriented to person, place, and time.  Psychiatric:        Behavior: Behavior normal.     ED Results / Procedures / Treatments   Labs (all labs ordered are listed, but only abnormal results are displayed) Labs Reviewed  BASIC METABOLIC PANEL  CBC  PREGNANCY, URINE  TROPONIN I (HIGH SENSITIVITY)  TROPONIN I (HIGH SENSITIVITY)    EKG EKG Interpretation  Date/Time:  Tuesday May 21 2019 19:32:01 EST Ventricular Rate:  86 PR Interval:  150 QRS Duration: 76 QT Interval:  342 QTC Calculation: 409 R Axis:   74 Text Interpretation: Normal sinus rhythm with sinus arrhythmia Normal ECG When comapred to prior, no significant changes seen, No STEMI Confirmed by [id number] (Theda Belfast) on 05/22/2019 10:58:29 AM   Radiology DG Chest 2 View  Result Date: 05/21/2019 CLINICAL DATA:  Chest pain EXAM: CHEST - 2 VIEW COMPARISON:  May 26, 2018 FINDINGS: The lungs are clear. The heart size and pulmonary vascularity are normal. No adenopathy. No pneumothorax. There is slight  anterior wedging of a lower thoracic vertebral body, stable. IMPRESSION: Lungs clear.  Cardiac silhouette normal.  No adenopathy. Electronically Signed   By: May 28, 2018 III M.D.   On: 05/21/2019 20:07    Procedures Procedures (including critical care time)  Medications  Ordered in ED Medications  ketorolac (TORADOL) 15 MG/ML injection 15 mg (15 mg Intravenous Given 05/21/19 2233)  alum & mag hydroxide-simeth (MAALOX/MYLANTA) 200-200-20 MG/5ML suspension 30 mL (30 mLs Oral Given 05/21/19 2232)    ED Course  I have reviewed the triage vital signs and the nursing notes.  Pertinent labs & imaging results that were available during my care of the patient were reviewed by me and considered in my medical decision making (see chart for details).  Chest pain work up is reassuring. Doubt ACS, PE, pericarditis, esophageal rupture, tension pneumothorax, aortic dissection, cardiac tamponade. EKG is NSR and shows no significant change since last. CXR is negative. Initial and second troponin is <2. Labs are unremarkable. PERC negative. Pain is atypical and likely MSK or GI related which are causing panic attacks. She was given rx for Omeprazole and encouraged to f/u with PCP.    MDM Rules/Calculators/A&P                       Final Clinical Impression(s) / ED Diagnoses Final diagnoses:  Atypical chest pain    Rx / DC Orders ED Discharge Orders    None       Recardo Evangelist, PA-C 05/22/19 1447    Davonna Belling, MD 05/22/19 [phone]

## 2019-05-22 NOTE — ED Notes (Signed)
Unable to obtain e-sign d/t equipment malfunction.  D/c instructions d/w pt including medications, pain relief, dietary choices, follow up and return precautions.  Pt verbalized understanding.

## 2019-10-07 ENCOUNTER — Emergency Department (HOSPITAL_BASED_OUTPATIENT_CLINIC_OR_DEPARTMENT_OTHER)
Admission: EM | Admit: 2019-10-07 | Discharge: 2019-10-07 | Discharge status: Against Medical Advice | Payer: Medicaid Other | Attending: Emergency Medicine | Admitting: Emergency Medicine

## 2019-10-07 ENCOUNTER — Encounter (HOSPITAL_BASED_OUTPATIENT_CLINIC_OR_DEPARTMENT_OTHER): Payer: Self-pay

## 2019-10-07 ENCOUNTER — Other Ambulatory Visit: Payer: Self-pay

## 2019-10-07 DIAGNOSIS — R221 Localized swelling, mass and lump, neck: Secondary | ICD-10-CM | POA: Diagnosis not present

## 2019-10-07 NOTE — ED Provider Notes (Signed)
I attempted to see the patient at 2205hrs but she refused to stop looking at her phone or give me any history. Advised that she would be seen again when she is willing to participate in history and exam.    Gina Savoy, MD 10/07/19 2206

## 2019-10-07 NOTE — ED Notes (Signed)
EDP went in to see pt  Pt was on the phone  EDP states he would go see another pt and then come back to see her  Pt got up and walked out of ED

## 2019-10-07 NOTE — ED Triage Notes (Signed)
Pt c/o swelling to  both side of neck-denies sore throat-c/o pain to face-NAD-steady gait

## 2019-10-07 NOTE — ED Notes (Signed)
Pt to nursing station stating she wants to leave because it is taking too long to be seen  Explained delay to pt  States she will wait a few more minutes

## 2020-05-02 DIAGNOSIS — U071 COVID-19: Secondary | ICD-10-CM

## 2020-05-02 HISTORY — DX: COVID-19: U07.1

## 2020-07-29 ENCOUNTER — Encounter (HOSPITAL_BASED_OUTPATIENT_CLINIC_OR_DEPARTMENT_OTHER): Payer: Self-pay

## 2020-07-29 ENCOUNTER — Emergency Department (HOSPITAL_BASED_OUTPATIENT_CLINIC_OR_DEPARTMENT_OTHER)
Admission: EM | Admit: 2020-07-29 | Discharge: 2020-07-29 | Disposition: A | Discharge status: Home / Self Care | Payer: Medicaid Other | Attending: Emergency Medicine | Admitting: Emergency Medicine

## 2020-07-29 ENCOUNTER — Emergency Department (HOSPITAL_BASED_OUTPATIENT_CLINIC_OR_DEPARTMENT_OTHER): Payer: Medicaid Other

## 2020-07-29 ENCOUNTER — Other Ambulatory Visit: Payer: Self-pay

## 2020-07-29 DIAGNOSIS — J45909 Unspecified asthma, uncomplicated: Secondary | ICD-10-CM | POA: Insufficient documentation

## 2020-07-29 DIAGNOSIS — R079 Chest pain, unspecified: Secondary | ICD-10-CM | POA: Diagnosis not present

## 2020-07-29 DIAGNOSIS — Z79899 Other long term (current) drug therapy: Secondary | ICD-10-CM | POA: Diagnosis not present

## 2020-07-29 DIAGNOSIS — Z7982 Long term (current) use of aspirin: Secondary | ICD-10-CM | POA: Diagnosis not present

## 2020-07-29 DIAGNOSIS — F17210 Nicotine dependence, cigarettes, uncomplicated: Secondary | ICD-10-CM | POA: Insufficient documentation

## 2020-07-29 DIAGNOSIS — M5412 Radiculopathy, cervical region: Secondary | ICD-10-CM | POA: Diagnosis not present

## 2020-07-29 LAB — TROPONIN I (HIGH SENSITIVITY): Troponin I (High Sensitivity): <2 ng/L (ref <18)

## 2020-07-29 LAB — BASIC METABOLIC PANEL
Anion gap: 8 (ref 5–15)
BUN: 13 mg/dL (ref 6–20)
CO2: 24 mmol/L (ref 22–32)
Calcium: 9.4 mg/dL (ref 8.9–10.3)
Chloride: 103 mmol/L (ref 98–111)
Creatinine, Ser: 0.75 mg/dL (ref 0.44–1.00)
GFR, Estimated: >60 mL/min (ref >60)
Glucose, Bld: 98 mg/dL (ref 70–99)
Potassium: 3.8 mmol/L (ref 3.5–5.1)
Sodium: 135 mmol/L (ref 135–145)

## 2020-07-29 LAB — CBC
HCT: 37.3 % (ref 36.0–46.0)
Hemoglobin: 11.9 g/dL — ABNORMAL LOW (ref 12.0–15.0)
MCH: 25.2 pg — ABNORMAL LOW (ref 26.0–34.0)
MCHC: 31.9 g/dL (ref 30.0–36.0)
MCV: 78.9 fL — ABNORMAL LOW (ref 80.0–100.0)
Platelets: 360 10*3/uL (ref 150–400)
RBC: 4.73 MIL/uL (ref 3.87–5.11)
RDW: 15.3 % (ref 11.5–15.5)
WBC: 8.9 10*3/uL (ref 4.0–10.5)
nRBC: 0 % (ref 0.0–0.2)

## 2020-07-29 LAB — PREGNANCY, URINE: Preg Test, Ur: NEGATIVE

## 2020-07-29 MED ORDER — KETOROLAC TROMETHAMINE 10 MG PO TABS
10.0000 mg | ORAL_TABLET | Freq: Once | ORAL | Status: AC
  Administered 2020-07-29: 10 mg via ORAL
  Filled 2020-07-29: qty 1

## 2020-07-29 MED ORDER — KETOROLAC TROMETHAMINE 10 MG PO TABS: 10.0000 mg | ORAL_TABLET | Freq: Four times a day (QID) | ORAL | 0 refills | Status: DC | PRN

## 2020-07-29 NOTE — ED Triage Notes (Signed)
Pt presents with complaints of intermittent CP x 2 days. Also reports L arm pain and "twitching" to L side of her face.

## 2020-07-29 NOTE — ED Provider Notes (Signed)
[facility] EMERGENCY DEPARTMENT Provider Note   CSN: [id number] Arrival date & time: 07/29/20  4825     History Chief Complaint  Patient presents with  . Chest Pain    Gina Martin is a 30 y.o. female.  The history is provided by the patient.  Chest Pain Pain location:  L chest Pain quality: sharp   Pain radiates to:  Neck, L arm, L jaw, upper back and mid back Pain severity:  Mild Onset quality:  Sudden Duration:  3 days Timing:  Intermittent Progression:  Waxing and waning Chronicity:  New Context: not lifting and not trauma   Relieved by:  Certain positions (massaging the area) Worsened by:  Nothing Ineffective treatments:  None tried Associated symptoms: no abdominal pain, no back pain, no cough, no fever, no lower extremity edema, no palpitations, no shortness of breath and no vomiting   Risk factors: smoking   Risk factors: no birth control, no coronary artery disease and no prior DVT/PE        Past Medical History:  Diagnosis Date  . Asthma   . Bipolar 1 disorder (HCC)   . Hyperlipidemia   . Preeclampsia   . Schizophrenia (HCC)     There are no problems to display for this patient.   Past Surgical History:  Procedure Laterality Date  . CESAREAN SECTION    . TUBAL LIGATION       OB History    Gravida  3   Para  1   Term      Preterm      AB      Living        SAB      IAB      Ectopic      Multiple      Live Births              No family history on file.  Social History   Tobacco Use  . Smoking status: Current Every Day Smoker    Packs/day: 1.00    Types: Cigarettes  . Smokeless tobacco: Never Used  Vaping Use  . Vaping Use: Never used  Substance Use Topics  . Alcohol use: Yes    Comment: occ  . Drug use: No    Home Medications Prior to Admission medications   Medication Sig Start Date End Date Taking? Authorizing Provider  ketorolac (TORADOL) 10 MG tablet Take 1 tablet (10 mg total) by mouth  every 6 (six) hours as needed. 07/29/20  Yes Koleen Distance, MD  albuterol (PROVENTIL HFA;VENTOLIN HFA) 108 (90 Base) MCG/ACT inhaler Inhale 2 puffs into the lungs every 6 (six) hours as needed for wheezing or shortness of breath.    [provider]  aspirin 81 MG chewable tablet Chew by mouth daily.    [provider]  IRON PO Take by mouth.    [provider]  Multiple Vitamins-Minerals (MULTIVITAMIN ADULT PO) Take by mouth.    [provider]  omeprazole (PRILOSEC) 20 MG capsule Take 1 capsule (20 mg total) by mouth daily. 05/21/19 07/29/20  Bethel Born, PA-C    Allergies    Shellfish allergy and Amoxicillin  Review of Systems   Review of Systems  Constitutional: Negative for chills and fever.  HENT: Negative for ear pain and sore throat.   Eyes: Negative for pain and visual disturbance.  Respiratory: Negative for cough and shortness of breath.   Cardiovascular: Positive for chest pain. Negative for palpitations.  Gastrointestinal: Negative for abdominal pain and vomiting.  Genitourinary: Negative for dysuria and hematuria.  Musculoskeletal: Negative for arthralgias and back pain.  Skin: Negative for color change and rash.  Neurological: Negative for seizures and syncope.  All other systems reviewed and are negative.   Physical Exam Updated Vital Signs BP 139/86   Pulse 82   Temp 98.7 F (37.1 C) (Oral)   Resp 19   Ht 5\' 11"  (1.803 m)   Wt 113.4 kg   LMP 07/22/2020   SpO2 100%   BMI 34.87 kg/m   Physical Exam Vitals and nursing note reviewed.  Constitutional:      General: She is not in acute distress.    Appearance: She is well-developed.  HENT:     Head: Normocephalic and atraumatic.  Eyes:     Conjunctiva/sclera: Conjunctivae normal.  Neck:     Comments: She has full range of motion of her neck but increased pain with forward flexion.  Forward flexion of the neck increases her chest pain Cardiovascular:     Rate and  Rhythm: Normal rate and regular rhythm.     Heart sounds: No murmur heard.   Pulmonary:     Effort: Pulmonary effort is normal. No respiratory distress.     Breath sounds: Normal breath sounds.  Musculoskeletal:     Cervical back: Neck supple.     Comments: Peripheral pulses of the left arm are within normal limits  Skin:    General: Skin is warm and dry.     Capillary Refill: Capillary refill takes less than 2 seconds.  Neurological:     General: No focal deficit present.     Mental Status: She is alert and oriented to person, place, and time.     Cranial Nerves: No cranial nerve deficit.     Motor: No weakness.     ED Results / Procedures / Treatments   Labs (all labs ordered are listed, but only abnormal results are displayed) Labs Reviewed  CBC - Abnormal; Notable for the following components:      Result Value   Hemoglobin 11.9 (*)    MCV 78.9 (*)    MCH 25.2 (*)    All other components within normal limits  BASIC METABOLIC PANEL  PREGNANCY, URINE  TROPONIN I (HIGH SENSITIVITY)  TROPONIN I (HIGH SENSITIVITY)    EKG EKG Interpretation  Date/Time:  Wednesday July 29 2020 19:22:50 EDT Ventricular Rate:  90 PR Interval:  140 QRS Duration: 80 QT Interval:  354 QTC Calculation: 433 R Axis:   80 Text Interpretation: Normal sinus rhythm Normal ECG normal axis no ischemia Confirmed by [id number] ([id number]) on 07/29/2020 8:40:13 PM   Radiology DG Chest 2 View  Result Date: 07/29/2020 CLINICAL DATA:  Chest pain for 2 days, left arm pain and twitching, negative UPT EXAM: CHEST - 2 VIEW COMPARISON:  Radiograph 12/15/2019 FINDINGS: No consolidation, features of edema, pneumothorax, or effusion. Pulmonary vascularity is normally distributed. The cardiomediastinal contours are unremarkable. No acute osseous or soft tissue abnormality. IMPRESSION: No acute cardiopulmonary abnormality. Electronically Signed   By: 12/17/2019 M.D.   On: 07/29/2020 19:50     Procedures Procedures   Medications Ordered in ED Medications  ketorolac (TORADOL) tablet 10 mg (has no administration in time range)    ED Course  I have reviewed the triage vital signs and the nursing notes.  Pertinent labs & imaging results that were available during my care of the patient were reviewed by me  and considered in my medical decision making (see chart for details).    MDM Rules/Calculators/A&P                          Tawny Raspberry presents with chest pain.  Pain appears to be musculoskeletal in nature, and I am strongly suspicious of a cervical radiculopathy.  Alternatively, she might have some chest wall pain.  She was evaluated for evidence of ACS.  She is PERC negative, and PE is not likely.  She will be discharged home with recommendations for symptomatic treatment.  Return precautions were discussed. Final Clinical Impression(s) / ED Diagnoses Final diagnoses:  Chest pain, unspecified type  Cervical radiculopathy    Rx / DC Orders ED Discharge Orders         Ordered    ketorolac (TORADOL) 10 MG tablet  Every 6 hours PRN        07/29/20 2057           Koleen Distance, MD 07/29/20 2102

## 2020-07-29 NOTE — ED Notes (Signed)
Patient transported to X-ray 

## 2020-08-18 ENCOUNTER — Other Ambulatory Visit (HOSPITAL_BASED_OUTPATIENT_CLINIC_OR_DEPARTMENT_OTHER): Payer: Self-pay

## 2020-08-18 ENCOUNTER — Other Ambulatory Visit: Payer: Self-pay

## 2020-08-18 ENCOUNTER — Encounter (HOSPITAL_BASED_OUTPATIENT_CLINIC_OR_DEPARTMENT_OTHER): Payer: Self-pay | Admitting: *Deleted

## 2020-08-18 ENCOUNTER — Emergency Department (HOSPITAL_BASED_OUTPATIENT_CLINIC_OR_DEPARTMENT_OTHER): Payer: Medicaid Other

## 2020-08-18 ENCOUNTER — Emergency Department (HOSPITAL_BASED_OUTPATIENT_CLINIC_OR_DEPARTMENT_OTHER)
Admission: EM | Admit: 2020-08-18 | Discharge: 2020-08-18 | Disposition: A | Discharge status: Home / Self Care | Payer: Medicaid Other | Attending: Emergency Medicine | Admitting: Emergency Medicine

## 2020-08-18 DIAGNOSIS — M546 Pain in thoracic spine: Secondary | ICD-10-CM | POA: Insufficient documentation

## 2020-08-18 DIAGNOSIS — K219 Gastro-esophageal reflux disease without esophagitis: Secondary | ICD-10-CM | POA: Insufficient documentation

## 2020-08-18 DIAGNOSIS — J45909 Unspecified asthma, uncomplicated: Secondary | ICD-10-CM | POA: Diagnosis not present

## 2020-08-18 DIAGNOSIS — F17210 Nicotine dependence, cigarettes, uncomplicated: Secondary | ICD-10-CM | POA: Insufficient documentation

## 2020-08-18 DIAGNOSIS — R0789 Other chest pain: Secondary | ICD-10-CM | POA: Diagnosis not present

## 2020-08-18 DIAGNOSIS — R1010 Upper abdominal pain, unspecified: Secondary | ICD-10-CM

## 2020-08-18 DIAGNOSIS — Z23 Encounter for immunization: Secondary | ICD-10-CM | POA: Diagnosis not present

## 2020-08-18 DIAGNOSIS — R1013 Epigastric pain: Secondary | ICD-10-CM | POA: Insufficient documentation

## 2020-08-18 DIAGNOSIS — Z7982 Long term (current) use of aspirin: Secondary | ICD-10-CM | POA: Diagnosis not present

## 2020-08-18 LAB — COMPREHENSIVE METABOLIC PANEL
ALT: 13 U/L (ref 0–44)
AST: 12 U/L — ABNORMAL LOW (ref 15–41)
Albumin: 3.8 g/dL (ref 3.5–5.0)
Alkaline Phosphatase: 79 U/L (ref 38–126)
Anion gap: 11 (ref 5–15)
BUN: 10 mg/dL (ref 6–20)
CO2: 23 mmol/L (ref 22–32)
Calcium: 9.3 mg/dL (ref 8.9–10.3)
Chloride: 99 mmol/L (ref 98–111)
Creatinine, Ser: 0.65 mg/dL (ref 0.44–1.00)
GFR, Estimated: >60 mL/min (ref >60)
Glucose, Bld: 100 mg/dL — ABNORMAL HIGH (ref 70–99)
Potassium: 3.7 mmol/L (ref 3.5–5.1)
Sodium: 133 mmol/L — ABNORMAL LOW (ref 135–145)
Total Bilirubin: 0.4 mg/dL (ref 0.3–1.2)
Total Protein: 8 g/dL (ref 6.5–8.1)

## 2020-08-18 LAB — CBC WITH DIFFERENTIAL/PLATELET
Abs Immature Granulocytes: 0.02 10*3/uL (ref 0.00–0.07)
Basophils Absolute: 0 10*3/uL (ref 0.0–0.1)
Basophils Relative: 1 %
Eosinophils Absolute: 0.2 10*3/uL (ref 0.0–0.5)
Eosinophils Relative: 2 %
HCT: 38.3 % (ref 36.0–46.0)
Hemoglobin: 12.1 g/dL (ref 12.0–15.0)
Immature Granulocytes: 0 %
Lymphocytes Relative: 25 %
Lymphs Abs: 1.9 10*3/uL (ref 0.7–4.0)
MCH: 25.4 pg — ABNORMAL LOW (ref 26.0–34.0)
MCHC: 31.6 g/dL (ref 30.0–36.0)
MCV: 80.3 fL (ref 80.0–100.0)
Monocytes Absolute: 0.6 10*3/uL (ref 0.1–1.0)
Monocytes Relative: 7 %
Neutro Abs: 4.8 10*3/uL (ref 1.7–7.7)
Neutrophils Relative %: 65 %
Platelets: 408 10*3/uL — ABNORMAL HIGH (ref 150–400)
RBC: 4.77 MIL/uL (ref 3.87–5.11)
RDW: 15.6 % — ABNORMAL HIGH (ref 11.5–15.5)
WBC: 7.5 10*3/uL (ref 4.0–10.5)
nRBC: 0 % (ref 0.0–0.2)

## 2020-08-18 LAB — TROPONIN I (HIGH SENSITIVITY): Troponin I (High Sensitivity): <2 ng/L (ref <18)

## 2020-08-18 LAB — LIPASE, BLOOD: Lipase: 25 U/L (ref 11–51)

## 2020-08-18 MED ORDER — NAPROXEN 500 MG PO TABS
500.0000 mg | ORAL_TABLET | Freq: Two times a day (BID) | ORAL | 0 refills | Status: DC
  Filled 2020-08-18: qty 20, 10d supply, fill #0

## 2020-08-18 MED ORDER — KETOROLAC TROMETHAMINE 15 MG/ML IJ SOLN
15.0000 mg | Freq: Once | INTRAMUSCULAR | Status: AC
  Administered 2020-08-18: 15 mg via INTRAVENOUS
  Filled 2020-08-18: qty 1

## 2020-08-18 MED ORDER — METHOCARBAMOL 500 MG PO TABS
500.0000 mg | ORAL_TABLET | Freq: Every evening | ORAL | 0 refills | Status: DC | PRN
  Filled 2020-08-18: qty 5, 5d supply, fill #0

## 2020-08-18 MED ORDER — METHOCARBAMOL 500 MG PO TABS
500.0000 mg | ORAL_TABLET | Freq: Once | ORAL | Status: AC
  Administered 2020-08-18: 500 mg via ORAL
  Filled 2020-08-18: qty 1

## 2020-08-18 NOTE — ED Triage Notes (Signed)
Epigastric pain x 3 days. She takes Omeprazole for GERD. Pain goes to both shoulders and down her back.

## 2020-08-18 NOTE — Discharge Instructions (Addendum)
Your pain is likely musculoskeletal.  This should be treated symptomatically. Take naproxen 2 times a day with meals.  Do not take other anti-inflammatories at the same time (Advil, Motrin, ibuprofen, Aleve). You may supplement with Tylenol if you need further pain control. Use ice packs or heating pads if this helps control your pain.  Use Robaxin as needed for muscle stiffness or soreness.  Have caution, this may make you tired or groggy.  Do not drive or operate heavy machinery while taking this medicine. Follow-up with your primary care doctor for recheck of your symptoms. Return to the emergency room if you develop severe worsening pain, fevers, difficulty breathing, persistent vomiting, or any new, worsening, or concerning symptoms.

## 2020-08-18 NOTE — ED Notes (Signed)
ED Provider at bedside. 

## 2020-08-18 NOTE — ED Provider Notes (Signed)
[facility] EMERGENCY DEPARTMENT Provider Note   CSN: [id number] Arrival date & time: 08/18/20  1330     History Chief Complaint  Patient presents with  . Chest Pain    Gina Martin is a 30 y.o. female presenting for evaluation of chest and back pain.  Patient states a few days ago she developed epigastric pain.  Since then, she has developed pain in her upper back bilaterally and continues to have epigastric/chest discomfort.  It is worse when she is laying flat and moving, improves when she is sitting up.  No change with p.o. intake.  She denies fevers or chills.  No cough, nausea, vomiting, abdominal pain, urinary symptoms, abnormal bowel movements.  She has not taken anything for her symptoms.  She does have a history of heartburn/GERD, states this feels different.  She was seen in the ED a few weeks ago for something similar, states her symptoms have completely resolved before this episode began.  She reports half a pack of cigarettes a day, intermittent alcohol use last used on Friday, no drug use.  Additional history obtained per chart review.  Patient with a history of asthma, bipolar, hyperlipidemia, schizophrenia.  I reviewed recent ER visit.  HPI     Past Medical History:  Diagnosis Date  . Asthma   . Bipolar 1 disorder (HCC)   . Hyperlipidemia   . Preeclampsia   . Schizophrenia (HCC)     There are no problems to display for this patient.   Past Surgical History:  Procedure Laterality Date  . CESAREAN SECTION    . TUBAL LIGATION       OB History    Gravida  3   Para  1   Term      Preterm      AB      Living        SAB      IAB      Ectopic      Multiple      Live Births              No family history on file.  Social History   Tobacco Use  . Smoking status: Current Every Day Smoker    Packs/day: 1.00    Types: Cigarettes  . Smokeless tobacco: Never Used  Vaping Use  . Vaping Use: Never used  Substance Use  Topics  . Alcohol use: Yes    Comment: occ  . Drug use: No    Home Medications Prior to Admission medications   Medication Sig Start Date End Date Taking? Authorizing Provider  albuterol (PROVENTIL HFA;VENTOLIN HFA) 108 (90 Base) MCG/ACT inhaler Inhale 2 puffs into the lungs every 6 (six) hours as needed for wheezing or shortness of breath.   Yes [provider]  methocarbamol (ROBAXIN) 500 MG tablet Take 1 tablet (500 mg total) by mouth at bedtime as needed for muscle spasms. 08/18/20  Yes Posey Jasmin, PA-C  Multiple Vitamins-Minerals (MULTIVITAMIN ADULT PO) Take by mouth.   Yes [provider]  naproxen (NAPROSYN) 500 MG tablet Take 1 tablet (500 mg total) by mouth 2 (two) times daily with a meal. 08/18/20  Yes Tuana Hoheisel, PA-C  aspirin 81 MG chewable tablet Chew by mouth daily.    [provider]  IRON PO Take by mouth.    [provider]  ketorolac (TORADOL) 10 MG tablet Take 1 tablet (10 mg total) by mouth every 6 (six) hours as needed. 07/29/20  Koleen Distance, MD  omeprazole (PRILOSEC) 20 MG capsule Take 1 capsule (20 mg total) by mouth daily. 05/21/19 07/29/20  Bethel Born, PA-C    Allergies    Shellfish allergy and Amoxicillin  Review of Systems   Review of Systems  Cardiovascular: Positive for chest pain.  Gastrointestinal: Positive for abdominal pain.  Musculoskeletal: Positive for back pain.  All other systems reviewed and are negative.   Physical Exam Updated Vital Signs BP 124/74 (BP Location: Right Arm)   Pulse 81   Temp 98.4 F (36.9 C) (Oral)   Resp 18   Ht 5\' 11"  (1.803 m)   Wt 112.9 kg   LMP 08/18/2020   SpO2 100%   BMI 34.73 kg/m   Physical Exam Vitals and nursing note reviewed.  Constitutional:      General: She is not in acute distress.    Appearance: She is well-developed. She is obese.     Comments: Resting in the bed in no acute distress  HENT:     Head: Normocephalic and atraumatic.   Eyes:     Conjunctiva/sclera: Conjunctivae normal.     Pupils: Pupils are equal, round, and reactive to light.  Cardiovascular:     Rate and Rhythm: Normal rate and regular rhythm.     Pulses: Normal pulses.  Pulmonary:     Effort: Pulmonary effort is normal. No respiratory distress.     Breath sounds: Normal breath sounds. No wheezing.     Comments: Diffuse tenderness palpation of the anterior chest wall, worse along the sternal border.   Speaking full sentences.  Clear lung sounds in all fields. Chest:     Chest wall: Tenderness present.  Abdominal:     General: There is no distension.     Palpations: Abdomen is soft. There is no mass.     Tenderness: There is abdominal tenderness. There is no guarding or rebound.     Comments: Mild tenderness palpation of epigastric abdomen.  No rigidity, guarding, syndrome.  Negative rebound.  Negative Murphy's.  No CVA tenderness.  Musculoskeletal:        General: Tenderness present. Normal range of motion.     Cervical back: Normal range of motion and neck supple.     Comments: Diffuse tenderness palpation of the upper back bilaterally.  No focal pain over midline spine.  No step-offs or deformities.  Radial pulses 2+ bilaterally.  Grip strength equal bilaterally.  Skin:    General: Skin is warm and dry.     Capillary Refill: Capillary refill takes less than 2 seconds.  Neurological:     Mental Status: She is alert and oriented to person, place, and time.     ED Results / Procedures / Treatments   Labs (all labs ordered are listed, but only abnormal results are displayed) Labs Reviewed  CBC WITH DIFFERENTIAL/PLATELET - Abnormal; Notable for the following components:      Result Value   MCH 25.4 (*)    RDW 15.6 (*)    Platelets 408 (*)    All other components within normal limits  COMPREHENSIVE METABOLIC PANEL - Abnormal; Notable for the following components:   Sodium 133 (*)    Glucose, Bld 100 (*)    AST 12 (*)    All other  components within normal limits  LIPASE, BLOOD  PREGNANCY, URINE  TROPONIN I (HIGH SENSITIVITY)    EKG EKG Interpretation  Date/Time:  Tuesday August 18 2020 13:37:43 EDT Ventricular Rate:  89 PR  Interval:  136 QRS Duration: 78 QT Interval:  346 QTC Calculation: 420 R Axis:   73 Text Interpretation: Normal sinus rhythm with sinus arrhythmia Normal ECG No significant change since last tracing Confirmed by Gwyneth Sprout ([id number]) on 08/18/2020 3:46:00 PM   Radiology DG Chest 2 View  Result Date: 08/18/2020 CLINICAL DATA:  Chest pain. EXAM: CHEST - 2 VIEW COMPARISON:  Chest x-ray dated July 29, 2020. FINDINGS: The heart size and mediastinal contours are within normal limits. Both lungs are clear. The visualized skeletal structures are unremarkable. IMPRESSION: No active cardiopulmonary disease. Electronically Signed   By: Obie Dredge M.D.   On: 08/18/2020 15:42    Procedures Procedures   Medications Ordered in ED Medications  methocarbamol (ROBAXIN) tablet 500 mg (500 mg Oral Given 08/18/20 1554)  ketorolac (TORADOL) 15 MG/ML injection 15 mg (15 mg Intravenous Given 08/18/20 1730)    ED Course  I have reviewed the triage vital signs and the nursing notes.  Pertinent labs & imaging results that were available during my care of the patient were reviewed by me and considered in my medical decision making (see chart for details).    MDM Rules/Calculators/A&P                          Patient presenting for evaluation of epigastric/anterior chest pain and back pain.  On exam, patient appears nontoxic.  Pain is reproducible with palpation, likely MSK.  However in the setting of continued chest pain, will obtain labs to ensure no ACS.  Also consider GI cause such as pancreatitis, gastritis, GERD/PUD.  Less likely cholelithiasis or cholecystitis as patient is without focal right upper quadrant pain, nausea, vomiting.  Labs interpreted by me, overall reassuring.  No leukocytosis.   Hemoglobin stable.  No kidney, liver, pancreatic abnormality.  Doubt cholecystitis or pancreatitis as presentation is atypical and labs are normal.  Troponin is negative.  In the setting of reproducible chest pain, a negative troponin and an unchanged EKG, doubt ACS.  Chest x-ray viewed interpreted by me, no pneumonia pneumothorax, effusion.  Discussed findings with patient.  Discussed likely MSK pain.  Discussed continued symptomatic treatment and follow-up with PCP.  At this time, patient appears safe for discharge.  Return precautions given.  Patient states she understands and agrees to plan.  Final Clinical Impression(s) / ED Diagnoses Final diagnoses:  Acute bilateral thoracic back pain  Upper abdominal pain  Chest wall pain    Rx / DC Orders ED Discharge Orders         Ordered    naproxen (NAPROSYN) 500 MG tablet  2 times daily with meals        08/18/20 1728    methocarbamol (ROBAXIN) 500 MG tablet  At bedtime PRN        08/18/20 1728           Jaylei Fuerte, PA-C 08/18/20 1733    Little, Ambrose Finland, MD 08/19/20 [id number]

## 2020-11-28 ENCOUNTER — Emergency Department (HOSPITAL_BASED_OUTPATIENT_CLINIC_OR_DEPARTMENT_OTHER): Payer: Medicaid Other

## 2020-11-28 ENCOUNTER — Emergency Department (HOSPITAL_BASED_OUTPATIENT_CLINIC_OR_DEPARTMENT_OTHER)
Admission: EM | Admit: 2020-11-28 | Discharge: 2020-11-28 | Disposition: A | Discharge status: Home / Self Care | Payer: Medicaid Other | Attending: Emergency Medicine | Admitting: Emergency Medicine

## 2020-11-28 ENCOUNTER — Other Ambulatory Visit: Payer: Self-pay

## 2020-11-28 ENCOUNTER — Encounter (HOSPITAL_BASED_OUTPATIENT_CLINIC_OR_DEPARTMENT_OTHER): Payer: Self-pay

## 2020-11-28 DIAGNOSIS — F17210 Nicotine dependence, cigarettes, uncomplicated: Secondary | ICD-10-CM | POA: Diagnosis not present

## 2020-11-28 DIAGNOSIS — Z7982 Long term (current) use of aspirin: Secondary | ICD-10-CM | POA: Insufficient documentation

## 2020-11-28 DIAGNOSIS — J45909 Unspecified asthma, uncomplicated: Secondary | ICD-10-CM | POA: Insufficient documentation

## 2020-11-28 DIAGNOSIS — R079 Chest pain, unspecified: Secondary | ICD-10-CM | POA: Diagnosis present

## 2020-11-28 DIAGNOSIS — R1011 Right upper quadrant pain: Secondary | ICD-10-CM | POA: Diagnosis not present

## 2020-11-28 LAB — COMPREHENSIVE METABOLIC PANEL
ALT: 13 U/L (ref 0–44)
AST: 15 U/L (ref 15–41)
Albumin: 3.5 g/dL (ref 3.5–5.0)
Alkaline Phosphatase: 76 U/L (ref 38–126)
Anion gap: 7 (ref 5–15)
BUN: 12 mg/dL (ref 6–20)
CO2: 24 mmol/L (ref 22–32)
Calcium: 8.9 mg/dL (ref 8.9–10.3)
Chloride: 105 mmol/L (ref 98–111)
Creatinine, Ser: 0.69 mg/dL (ref 0.44–1.00)
GFR, Estimated: >60 mL/min (ref >60)
Glucose, Bld: 110 mg/dL — ABNORMAL HIGH (ref 70–99)
Potassium: 3.8 mmol/L (ref 3.5–5.1)
Sodium: 136 mmol/L (ref 135–145)
Total Bilirubin: 0.2 mg/dL — ABNORMAL LOW (ref 0.3–1.2)
Total Protein: 7.1 g/dL (ref 6.5–8.1)

## 2020-11-28 LAB — CBC WITH DIFFERENTIAL/PLATELET
Abs Immature Granulocytes: 0.01 10*3/uL (ref 0.00–0.07)
Basophils Absolute: 0 10*3/uL (ref 0.0–0.1)
Basophils Relative: 1 %
Eosinophils Absolute: 0.2 10*3/uL (ref 0.0–0.5)
Eosinophils Relative: 3 %
HCT: 36.2 % (ref 36.0–46.0)
Hemoglobin: 11.5 g/dL — ABNORMAL LOW (ref 12.0–15.0)
Immature Granulocytes: 0 %
Lymphocytes Relative: 26 %
Lymphs Abs: 2 10*3/uL (ref 0.7–4.0)
MCH: 24.9 pg — ABNORMAL LOW (ref 26.0–34.0)
MCHC: 31.8 g/dL (ref 30.0–36.0)
MCV: 78.5 fL — ABNORMAL LOW (ref 80.0–100.0)
Monocytes Absolute: 0.6 10*3/uL (ref 0.1–1.0)
Monocytes Relative: 8 %
Neutro Abs: 4.7 10*3/uL (ref 1.7–7.7)
Neutrophils Relative %: 62 %
Platelets: 369 10*3/uL (ref 150–400)
RBC: 4.61 MIL/uL (ref 3.87–5.11)
RDW: 15.7 % — ABNORMAL HIGH (ref 11.5–15.5)
WBC: 7.5 10*3/uL (ref 4.0–10.5)
nRBC: 0 % (ref 0.0–0.2)

## 2020-11-28 LAB — TROPONIN I (HIGH SENSITIVITY): Troponin I (High Sensitivity): <2 ng/L (ref <18)

## 2020-11-28 MED ORDER — METHOCARBAMOL 500 MG PO TABS: 1000.0000 mg | ORAL_TABLET | Freq: Four times a day (QID) | ORAL | 0 refills | Status: DC

## 2020-11-28 MED ORDER — LIDOCAINE VISCOUS HCL 2 % MT SOLN
15.0000 mL | Freq: Once | OROMUCOSAL | Status: AC
  Administered 2020-11-28: 15 mL via ORAL
  Filled 2020-11-28: qty 15

## 2020-11-28 MED ORDER — KETOROLAC TROMETHAMINE 15 MG/ML IJ SOLN
15.0000 mg | Freq: Once | INTRAMUSCULAR | Status: AC
  Administered 2020-11-28: 15 mg via INTRAVENOUS
  Filled 2020-11-28: qty 1

## 2020-11-28 MED ORDER — ALUM & MAG HYDROXIDE-SIMETH 200-200-20 MG/5ML PO SUSP
30.0000 mL | Freq: Once | ORAL | Status: AC
  Administered 2020-11-28: 30 mL via ORAL
  Filled 2020-11-28: qty 30

## 2020-11-28 NOTE — ED Triage Notes (Signed)
Right side chest pain that started a few days ago and went away but came back. States it happens while she is sleeping. Radiated to her back.

## 2020-11-28 NOTE — ED Notes (Signed)
Pt reports pain Goes to her back and hurts when she moves her neck.

## 2020-11-28 NOTE — ED Provider Notes (Signed)
[facility] EMERGENCY DEPARTMENT Provider Note   CSN: [id number] Arrival date & time: 11/28/20  1015     History Chief Complaint  Patient presents with   Chest Pain    Goes to her back and hurts when she moves her neck.    Gina Martin is a 30 y.o. female.  Patient with history of chest pain presents the emergency department today for evaluation of right-sided chest pain.  Patient states that she has had the symptoms in the past.  Her pain is in the right chest.  She first noticed it a couple days ago.  Pain awoke her from sleep.  She states that pain is positional and when she rolled onto her stomach, the pain got better.  Early this morning, she was once again awoken due to the pain.  Today did not get better.  She describes it as a tenderness in her right chest that radiates to her back.  She denies shortness of breath, vomiting.  Pain is nonexertional.  She denies abdominal pain to me.  No treatments prior to arrival. Patient denies risk factors for pulmonary embolism including: unilateral leg swelling, history of DVT/PE/other blood clots, use of exogenous hormones, recent immobilizations, recent surgery, recent travel (>4hr segment), malignancy, hemoptysis.   Of note, patient has had work-up for chest pain over the past several months.  She has had normal echocardiogram.  She has had cardiology evaluation.  They do not feel that her symptoms are heart related.  Patient is concerned because she has a family history of coronary artery disease.  Both her mother and grandmother have required stents.  Pain does not seem to be affected by eating or drinking.  Sometimes she will have tingling into her arms, but not currently.      Past Medical History:  Diagnosis Date   Asthma    Bipolar 1 disorder (HCC)    Hyperlipidemia    Preeclampsia    Schizophrenia (HCC)     There are no problems to display for this patient.   Past Surgical History:  Procedure Laterality Date    CESAREAN SECTION     TUBAL LIGATION       OB History     Gravida  3   Para  1   Term      Preterm      AB      Living         SAB      IAB      Ectopic      Multiple      Live Births              History reviewed. No pertinent family history.  Social History   Tobacco Use   Smoking status: Every Day    Packs/day: 1.00    Types: Cigarettes   Smokeless tobacco: Never  Vaping Use   Vaping Use: Never used  Substance Use Topics   Alcohol use: Yes    Comment: occ   Drug use: No    Home Medications Prior to Admission medications   Medication Sig Start Date End Date Taking? Authorizing Provider  albuterol (PROVENTIL HFA;VENTOLIN HFA) 108 (90 Base) MCG/ACT inhaler Inhale 2 puffs into the lungs every 6 (six) hours as needed for wheezing or shortness of breath.    [provider]  aspirin 81 MG chewable tablet Chew by mouth daily.    [provider]  IRON PO Take by mouth.    [provider]  ketorolac (TORADOL) 10 MG tablet Take 1 tablet (10 mg total) by mouth every 6 (six) hours as needed. 07/29/20   Koleen Distance, MD  methocarbamol (ROBAXIN) 500 MG tablet Take 1 tablet (500 mg total) by mouth at bedtime as needed for muscle spasms. 08/18/20   Caccavale, Sophia, PA-C  Multiple Vitamins-Minerals (MULTIVITAMIN ADULT PO) Take by mouth.    [provider]  naproxen (NAPROSYN) 500 MG tablet Take 1 tablet (500 mg total) by mouth 2 (two) times daily with a meal. 08/18/20   Caccavale, Sophia, PA-C  omeprazole (PRILOSEC) 20 MG capsule Take 1 capsule (20 mg total) by mouth daily. 05/21/19 07/29/20  Bethel Born, PA-C    Allergies    Shellfish allergy and Amoxicillin  Review of Systems   Review of Systems  Constitutional:  Negative for fever.  HENT:  Negative for rhinorrhea and sore throat.   Eyes:  Negative for redness.  Respiratory:  Negative for cough.   Cardiovascular:  Positive for chest pain.  Gastrointestinal:   Negative for abdominal pain, diarrhea, nausea and vomiting.  Genitourinary:  Negative for dysuria, frequency, hematuria and urgency.  Musculoskeletal:  Negative for myalgias.  Skin:  Negative for rash.  Neurological:  Negative for headaches.   Physical Exam Updated Vital Signs BP 131/76 (BP Location: Right Arm)   Pulse 85   Temp 97.9 F (36.6 C) (Oral)   Resp 18   Ht 5\' 11"  (1.803 m)   Wt 112.8 kg   SpO2 98%   BMI 34.69 kg/m   Physical Exam Vitals and nursing note reviewed.  Constitutional:      Appearance: She is well-developed. She is not diaphoretic.  HENT:     Head: Normocephalic and atraumatic.     Mouth/Throat:     Mouth: Mucous membranes are not dry.  Eyes:     Conjunctiva/sclera: Conjunctivae normal.  Neck:     Vascular: Normal carotid pulses. No JVD.     Trachea: Trachea normal. No tracheal deviation.  Cardiovascular:     Rate and Rhythm: Normal rate and regular rhythm.     Pulses: No decreased pulses.          Radial pulses are 2+ on the right side and 2+ on the left side.     Heart sounds: Normal heart sounds, S1 normal and S2 normal. No murmur heard. Pulmonary:     Effort: Pulmonary effort is normal. No respiratory distress.     Breath sounds: No wheezing.  Chest:     Chest wall: Tenderness present.     Comments: Patient winces with palpation over the right of the chest.  She states that this exacerbates the pain that she is having. Abdominal:     General: Bowel sounds are normal.     Palpations: Abdomen is soft.     Tenderness: There is abdominal tenderness. There is no guarding or rebound.     Comments: Patient winces with palpation in the right upper quadrant.  She denied having abdominal pain during history but states that when I push on this area she has significant tenderness.  Musculoskeletal:        General: Normal range of motion.     Cervical back: Normal range of motion and neck supple. No muscular tenderness.  Skin:    General: Skin is warm  and dry.     Coloration: Skin is not pale.  Neurological:     Mental Status: She is alert.    ED  Results / Procedures / Treatments   Labs (all labs ordered are listed, but only abnormal results are displayed) Labs Reviewed  CBC WITH DIFFERENTIAL/PLATELET - Abnormal; Notable for the following components:      Result Value   Hemoglobin 11.5 (*)    MCV 78.5 (*)    MCH 24.9 (*)    RDW 15.7 (*)    All other components within normal limits  COMPREHENSIVE METABOLIC PANEL - Abnormal; Notable for the following components:   Glucose, Bld 110 (*)    Total Bilirubin 0.2 (*)    All other components within normal limits  TROPONIN I (HIGH SENSITIVITY)    ED ECG REPORT   Date: 11/28/2020  Rate: 86  Rhythm: normal sinus rhythm  QRS Axis: normal  Intervals: normal  ST/T Wave abnormalities: nonspecific ST changes  Conduction Disutrbances:none  Narrative Interpretation:   Old EKG Reviewed: changes noted, t-waves now upright in III otherwise similar EKG  I have personally reviewed the EKG tracing and agree with the computerized printout as noted.   Radiology DG Chest 2 View  Result Date: 11/28/2020 CLINICAL DATA:  Right-sided chest pain and upper back pain. EXAM: CHEST - 2 VIEW COMPARISON:  08/18/2020 FINDINGS: The heart size and mediastinal contours are within normal limits. Both lungs are clear. The visualized skeletal structures are unremarkable. IMPRESSION: No active cardiopulmonary disease. Electronically Signed   By: Signa Kellaylor  Stroud M.D.   On: 11/28/2020 12:01   US Abdomen Limited RUQ (LIVER/GB)  Result Date: 11/28/2020 CLINICAL DATA:  Right-sided chest pain for 2 days. EXAM: ULTRASOUND ABDOMEN LIMITED RIGHT UPPER QUADRANT COMPARISON:  CT abdomen pelvis 12/15/2019 FINDINGS: Gallbladder: No gallstones or wall thickening visualized. No sonographic Murphy sign noted by sonographer. Common bile duct: Diameter: 0.3 cm, within normal limits Liver: No focal lesion identified. Within normal  limits in parenchymal echogenicity. Portal vein is patent on color Doppler imaging with normal direction of blood flow towards the liver. Other: None. IMPRESSION: No sonographic finding in the right upper quadrant to explain the patient's symptoms. Electronically Signed   By: Emmaline KluverNancy  Ballantyne M.D.   On: 11/28/2020 11:48    Procedures Procedures   Medications Ordered in ED Medications  alum & mag hydroxide-simeth (MAALOX/MYLANTA) 200-200-20 MG/5ML suspension 30 mL (has no administration in time range)    And  lidocaine (XYLOCAINE) 2 % viscous mouth solution 15 mL (has no administration in time range)  ketorolac (TORADOL) 15 MG/ML injection 15 mg (15 mg Intravenous Given 11/28/20 1053)    ED Course  I have reviewed the triage vital signs and the nursing notes.  Pertinent labs & imaging results that were available during my care of the patient were reviewed by me and considered in my medical decision making (see chart for details).  Patient seen and examined. Work-up initiated. Medications ordered.  Patient symptoms are unlikely to be cardiac related.  EKG reviewed.  Vital signs reviewed and are as follows: BP 131/76 (BP Location: Right Arm)   Pulse 85   Temp 97.9 F (36.6 C) (Oral)   Resp 18   Ht 5\' 11"  (1.803 m)   Wt 112.8 kg   SpO2 98%   BMI 34.69 kg/m   Work-up is reassuring.  Ultrasound is negative.  Chest x-ray is clear.  Troponin is normal.  Symptoms seem to be consistent with the patient's chronic intermittent chest pain.  On reassessment, patient states that she is feeling better.  She is laying on her left side and states that she was not  able to lay on the side when her pain was worse earlier.  Plan for continued treatment at home with OTC meds, muscle relaxer. follow-up with PCP.   Patient was counseled to return with severe chest pain, especially if the pain is crushing or pressure-like and spreads to the arms, back, neck, or jaw, or if they have sweating, nausea, or  shortness of breath with the pain. They were encouraged to call 911 with these symptoms.   The patient verbalized understanding and agreed.     MDM Rules/Calculators/A&P                           CP/upper abd pain: Patient with chest tightness.  Multiple intermittent episodes of the past several months, evaluated by outpatient cardiology, PCP, in the ED.  Feel patient is low risk for ACS given history (poor story for ACS/MI), negative troponin(s), normal/unchanged EKG. her symptoms are reproducible.  Considered right upper quadrant abdominal pain as potential etiology given radiation to the back and tenderness on exam.  Right upper quadrant ultrasound is negative.  Normal liver function tests.  We will continue conservative, symptomatic treatment.  No dangerous or life-threatening conditions suspected or identified by history, physical exam, and by work-up. No indications for hospitalization identified.     Final Clinical Impression(s) / ED Diagnoses Final diagnoses:  RUQ pain  Right-sided chest pain    Rx / DC Orders ED Discharge Orders          Ordered    methocarbamol (ROBAXIN) 500 MG tablet  4 times daily,   Status:  Discontinued        11/28/20 1228    methocarbamol (ROBAXIN) 500 MG tablet  4 times daily        11/28/20 1230             Renne Crigler, PA-C 11/28/20 1231    Koleen Distance, MD 11/28/20 1406

## 2020-11-28 NOTE — Discharge Instructions (Addendum)
Please read and follow all provided instructions.  Your diagnoses today include:  1. Right-sided chest pain   2. RUQ pain     Tests performed today include: An EKG of your heart A chest x-ray Cardiac enzymes - a blood test for heart muscle damage Blood counts and electrolytes Ultrasound of your gallbladder - looks normal Vital signs. See below for your results today.   Medications prescribed:  None  Take any prescribed medications only as directed.  Follow-up instructions: Please follow-up with your primary care provider as soon as you can for further evaluation of your symptoms.   Return instructions:  SEEK IMMEDIATE MEDICAL ATTENTION IF: You have severe chest pain, especially if the pain is crushing or pressure-like and spreads to the arms, back, neck, or jaw, or if you have sweating, nausea (feeling sick to your stomach), or shortness of breath. THIS IS AN EMERGENCY. Don't wait to see if the pain will go away. Get medical help at once. Call 911 or 0 (operator). DO NOT drive yourself to the hospital.  Your chest pain gets worse and does not go away with rest.  You have an attack of chest pain lasting longer than usual, despite rest and treatment with the medications your caregiver has prescribed.  You wake from sleep with chest pain or shortness of breath. You feel dizzy or faint. You have chest pain not typical of your usual pain for which you originally saw your caregiver.  You have any other emergent concerns regarding your health.  Additional Information: Chest pain comes from many different causes. Your caregiver has diagnosed you as having chest pain that is not specific for one problem, but does not require admission.  You are at low risk for an acute heart condition or other serious illness.   Your vital signs today were: BP 124/75   Pulse 84   Temp 97.9 F (36.6 C) (Oral)   Resp 20   Ht 5\' 11"  (1.803 m)   Wt 112.8 kg   LMP 11/26/2020   SpO2 100%   BMI 34.69  kg/m  If your blood pressure (BP) was elevated above 135/85 this visit, please have this repeated by your doctor within one month. --------------

## 2020-11-28 NOTE — ED Notes (Signed)
Pt discharged to home. Discharge instructions have been discussed with patient and/or family members. Pt verbally acknowledges understanding d/c instructions, and endorses comprehension to checkout at registration before leaving.  °

## 2021-01-07 ENCOUNTER — Other Ambulatory Visit: Payer: Self-pay

## 2021-01-07 ENCOUNTER — Emergency Department (HOSPITAL_BASED_OUTPATIENT_CLINIC_OR_DEPARTMENT_OTHER)
Admission: EM | Admit: 2021-01-07 | Discharge: 2021-01-07 | Disposition: A | Discharge status: Home / Self Care | Payer: Medicaid Other | Attending: Emergency Medicine | Admitting: Emergency Medicine

## 2021-01-07 ENCOUNTER — Encounter (HOSPITAL_BASED_OUTPATIENT_CLINIC_OR_DEPARTMENT_OTHER): Payer: Self-pay | Admitting: *Deleted

## 2021-01-07 DIAGNOSIS — M79602 Pain in left arm: Secondary | ICD-10-CM | POA: Insufficient documentation

## 2021-01-07 DIAGNOSIS — F17210 Nicotine dependence, cigarettes, uncomplicated: Secondary | ICD-10-CM | POA: Diagnosis not present

## 2021-01-07 DIAGNOSIS — J45909 Unspecified asthma, uncomplicated: Secondary | ICD-10-CM | POA: Diagnosis not present

## 2021-01-07 DIAGNOSIS — M5412 Radiculopathy, cervical region: Secondary | ICD-10-CM | POA: Diagnosis not present

## 2021-01-07 DIAGNOSIS — R6884 Jaw pain: Secondary | ICD-10-CM | POA: Diagnosis not present

## 2021-01-07 DIAGNOSIS — M79604 Pain in right leg: Secondary | ICD-10-CM | POA: Diagnosis not present

## 2021-01-07 DIAGNOSIS — R079 Chest pain, unspecified: Secondary | ICD-10-CM | POA: Insufficient documentation

## 2021-01-07 DIAGNOSIS — M79605 Pain in left leg: Secondary | ICD-10-CM | POA: Diagnosis not present

## 2021-01-07 DIAGNOSIS — M25512 Pain in left shoulder: Secondary | ICD-10-CM | POA: Insufficient documentation

## 2021-01-07 DIAGNOSIS — M542 Cervicalgia: Secondary | ICD-10-CM | POA: Diagnosis present

## 2021-01-07 LAB — CBC WITH DIFFERENTIAL/PLATELET
Abs Immature Granulocytes: 0.03 10*3/uL (ref 0.00–0.07)
Basophils Absolute: 0 10*3/uL (ref 0.0–0.1)
Basophils Relative: 1 %
Eosinophils Absolute: 0.2 10*3/uL (ref 0.0–0.5)
Eosinophils Relative: 2 %
HCT: 38.4 % (ref 36.0–46.0)
Hemoglobin: 12.2 g/dL (ref 12.0–15.0)
Immature Granulocytes: 0 %
Lymphocytes Relative: 31 %
Lymphs Abs: 2.6 10*3/uL (ref 0.7–4.0)
MCH: 25.5 pg — ABNORMAL LOW (ref 26.0–34.0)
MCHC: 31.8 g/dL (ref 30.0–36.0)
MCV: 80.3 fL (ref 80.0–100.0)
Monocytes Absolute: 0.7 10*3/uL (ref 0.1–1.0)
Monocytes Relative: 8 %
Neutro Abs: 5.1 10*3/uL (ref 1.7–7.7)
Neutrophils Relative %: 58 %
Platelets: 433 10*3/uL — ABNORMAL HIGH (ref 150–400)
RBC: 4.78 MIL/uL (ref 3.87–5.11)
RDW: 15.5 % (ref 11.5–15.5)
WBC: 8.6 10*3/uL (ref 4.0–10.5)
nRBC: 0 % (ref 0.0–0.2)

## 2021-01-07 LAB — BASIC METABOLIC PANEL
Anion gap: 7 (ref 5–15)
BUN: 11 mg/dL (ref 6–20)
CO2: 25 mmol/L (ref 22–32)
Calcium: 9.3 mg/dL (ref 8.9–10.3)
Chloride: 104 mmol/L (ref 98–111)
Creatinine, Ser: 0.74 mg/dL (ref 0.44–1.00)
GFR, Estimated: >60 mL/min (ref >60)
Glucose, Bld: 96 mg/dL (ref 70–99)
Potassium: 4 mmol/L (ref 3.5–5.1)
Sodium: 136 mmol/L (ref 135–145)

## 2021-01-07 NOTE — ED Triage Notes (Signed)
C/o mid sternal chest pain x 4 days

## 2021-01-07 NOTE — ED Provider Notes (Signed)
[facility] EMERGENCY DEPARTMENT Provider Note   CSN: [id number] Arrival date & time: 01/07/21  1433     History Chief Complaint  Patient presents with   Chest Pain    Gina Martin is a 30 y.o. female.  30 year old female with past medical history of asthma, bipolar disorder, hyperlipidemia, schizophrenia presents with complaint of pain.  Patient states that she has episodes with pain starting in her neck and shoulder area, radiates down her left arm, into her left jaw, into her chest, down her legs and gets a knot in her throat sensation.  Patient has been having these episodes for some time, has been to cardiology and had a normal echo, has had several ER work-ups, most recently suggesting possible cervical radiculopathy.  Patient has not been back to her PCP for further work-up of cervical radiculopathy.  Patient is concerned because her mother had left arm pain and chest pain and ended up having a stent.  Patient is a daily smoker, currently smoking 1 pack/day, down from 2 packs/day.      Past Medical History:  Diagnosis Date   Asthma    Bipolar 1 disorder (HCC)    Hyperlipidemia    Preeclampsia    Schizophrenia (HCC)     There are no problems to display for this patient.   Past Surgical History:  Procedure Laterality Date   CESAREAN SECTION     TUBAL LIGATION       OB History     Gravida  3   Para  1   Term      Preterm      AB      Living         SAB      IAB      Ectopic      Multiple      Live Births              No family history on file.  Social History   Tobacco Use   Smoking status: Every Day    Packs/day: 1.00    Types: Cigarettes   Smokeless tobacco: Never  Vaping Use   Vaping Use: Never used  Substance Use Topics   Alcohol use: Yes    Comment: occ   Drug use: No    Home Medications Prior to Admission medications   Medication Sig Start Date End Date Taking? Authorizing Provider  albuterol (PROVENTIL  HFA;VENTOLIN HFA) 108 (90 Base) MCG/ACT inhaler Inhale 2 puffs into the lungs every 6 (six) hours as needed for wheezing or shortness of breath.    [provider]  IRON PO Take by mouth.    [provider]  ketorolac (TORADOL) 10 MG tablet Take 1 tablet (10 mg total) by mouth every 6 (six) hours as needed. 07/29/20   Koleen Distance, MD  methocarbamol (ROBAXIN) 500 MG tablet Take 2 tablets (1,000 mg total) by mouth 4 (four) times daily. 11/28/20   Renne Crigler, PA-C  Multiple Vitamins-Minerals (MULTIVITAMIN ADULT PO) Take by mouth.    [provider]  omeprazole (PRILOSEC) 20 MG capsule Take 1 capsule (20 mg total) by mouth daily. 05/21/19 07/29/20  Bethel Born, PA-C    Allergies    Shellfish allergy and Amoxicillin  Review of Systems   Review of Systems  Constitutional:  Positive for fatigue. Negative for fever.  Respiratory:  Positive for shortness of breath.   Cardiovascular:  Positive for chest pain.  Gastrointestinal:  Positive for nausea.  Negative for abdominal pain and vomiting.  Genitourinary:  Negative for difficulty urinating and dysuria.  Musculoskeletal:  Positive for arthralgias, myalgias and neck pain.  Skin:  Negative for rash and wound.  Allergic/Immunologic: Negative for immunocompromised state.  Neurological:  Negative for weakness.  Psychiatric/Behavioral:  Negative for confusion.   All other systems reviewed and are negative.  Physical Exam Updated Vital Signs BP 126/85   Pulse 80   Temp 98.7 F (37.1 C) (Oral)   Resp 16   Ht 5\' 11"  (1.803 m)   Wt 112 kg   LMP 12/26/2020   SpO2 100%   BMI 34.45 kg/m   Physical Exam Vitals and nursing note reviewed.  Constitutional:      General: She is not in acute distress.    Appearance: She is well-developed. She is not diaphoretic.  HENT:     Head: Normocephalic and atraumatic.     Mouth/Throat:     Mouth: Mucous membranes are moist.  Eyes:     Extraocular Movements: Extraocular  movements intact.     Pupils: Pupils are equal, round, and reactive to light.  Cardiovascular:     Rate and Rhythm: Normal rate and regular rhythm.     Heart sounds: Normal heart sounds. No murmur heard. Pulmonary:     Effort: Pulmonary effort is normal.     Breath sounds: Normal breath sounds. No decreased breath sounds.  Chest:     Chest wall: No tenderness.  Abdominal:     Palpations: Abdomen is soft.     Tenderness: There is no abdominal tenderness.  Musculoskeletal:        General: Tenderness present.     Cervical back: Neck supple.     Right lower leg: No tenderness. No edema.     Left lower leg: No tenderness. No edema.     Comments: Ttp right and left trapezius area extends to medial border of scapula bilaterally  Skin:    General: Skin is warm and dry.     Findings: No erythema or rash.  Neurological:     Mental Status: She is alert and oriented to person, place, and time.     Cranial Nerves: No cranial nerve deficit.     Sensory: No sensory deficit.     Motor: No weakness.     Deep Tendon Reflexes: Reflexes normal.  Psychiatric:        Behavior: Behavior normal.    ED Results / Procedures / Treatments   Labs (all labs ordered are listed, but only abnormal results are displayed) Labs Reviewed  CBC WITH DIFFERENTIAL/PLATELET - Abnormal; Notable for the following components:      Result Value   MCH 25.5 (*)    Platelets 433 (*)    All other components within normal limits  BASIC METABOLIC PANEL    EKG EKG Interpretation  Date/Time:  Thursday January 07 2021 14:45:49 EDT Ventricular Rate:  90 PR Interval:  140 QRS Duration: 80 QT Interval:  344 QTC Calculation: 420 R Axis:   89 Text Interpretation: Normal sinus rhythm Normal ECG normal axis No acute ischemia Confirmed by [id number]) on 01/07/2021 2:57:33 PM  Radiology No results found.  Procedures Procedures   Medications Ordered in ED Medications - No data to display  ED Course  I have  reviewed the triage vital signs and the nursing notes.  Pertinent labs & imaging results that were available during my care of the patient were reviewed by me and considered in my  medical decision making (see chart for details).  Clinical Course as of 01/07/21 1738  Thu Jan 07, 2021  7710 30 year old female with complaint of pain that starts in her shoulders and radiates down her left arm into her left side jaw and down into her legs as described above.  Patient has been seen by cardiology within the past 6 months, has had an echo and several ER visits for chest pain which have all been unremarkable.  Has been suggested that this may be related to cervical radiculopathy and recommend follow-up with her primary care provider.  Patient was not aware of this suggestion and has not seen her doctor for same.  Agrees that her symptoms started off up in her neck and then seem to progress or lead into a panic type situation where she feels a knot in her throat.  Patient is using her phone to monitor her heart rate, stress, energy levels. Suggest trying a meditation app. Recommend warm compresses to her shoulders with gentle stretching and massage and follow-up with your primary care provider. Her labs today are reassuring including CBC and BMP. [LM]  1738 EKG is unremarkable, no ischemic changes. [LM]    Clinical Course User Index [LM] Alden Hipp   MDM Rules/Calculators/A&P                            Final Clinical Impression(s) / ED Diagnoses Final diagnoses:  Cervical radiculopathy    Rx / DC Orders ED Discharge Orders     None        Jeannie Fend, PA-C 01/07/21 1738    Ernie Avena, MD 01/08/21 1215

## 2021-02-14 ENCOUNTER — Other Ambulatory Visit: Payer: Self-pay

## 2021-02-14 ENCOUNTER — Encounter (HOSPITAL_BASED_OUTPATIENT_CLINIC_OR_DEPARTMENT_OTHER): Payer: Self-pay

## 2021-02-14 ENCOUNTER — Emergency Department (HOSPITAL_BASED_OUTPATIENT_CLINIC_OR_DEPARTMENT_OTHER): Payer: Medicaid Other

## 2021-02-14 ENCOUNTER — Emergency Department (HOSPITAL_BASED_OUTPATIENT_CLINIC_OR_DEPARTMENT_OTHER)
Admission: EM | Admit: 2021-02-14 | Discharge: 2021-02-14 | Disposition: A | Discharge status: Home / Self Care | Payer: Medicaid Other | Attending: Emergency Medicine | Admitting: Emergency Medicine

## 2021-02-14 DIAGNOSIS — E860 Dehydration: Secondary | ICD-10-CM | POA: Insufficient documentation

## 2021-02-14 DIAGNOSIS — F17210 Nicotine dependence, cigarettes, uncomplicated: Secondary | ICD-10-CM | POA: Diagnosis not present

## 2021-02-14 DIAGNOSIS — R0602 Shortness of breath: Secondary | ICD-10-CM | POA: Diagnosis not present

## 2021-02-14 DIAGNOSIS — J45909 Unspecified asthma, uncomplicated: Secondary | ICD-10-CM | POA: Insufficient documentation

## 2021-02-14 DIAGNOSIS — R1110 Vomiting, unspecified: Secondary | ICD-10-CM | POA: Diagnosis not present

## 2021-02-14 DIAGNOSIS — R42 Dizziness and giddiness: Secondary | ICD-10-CM | POA: Diagnosis present

## 2021-02-14 LAB — PREGNANCY, URINE: Preg Test, Ur: NEGATIVE

## 2021-02-14 LAB — BASIC METABOLIC PANEL
Anion gap: 8 (ref 5–15)
BUN: 11 mg/dL (ref 6–20)
CO2: 23 mmol/L (ref 22–32)
Calcium: 9.2 mg/dL (ref 8.9–10.3)
Chloride: 104 mmol/L (ref 98–111)
Creatinine, Ser: 0.85 mg/dL (ref 0.44–1.00)
GFR, Estimated: >60 mL/min (ref >60)
Glucose, Bld: 98 mg/dL (ref 70–99)
Potassium: 3.9 mmol/L (ref 3.5–5.1)
Sodium: 135 mmol/L (ref 135–145)

## 2021-02-14 LAB — CBC
HCT: 37.1 % (ref 36.0–46.0)
Hemoglobin: 12.1 g/dL (ref 12.0–15.0)
MCH: 25.8 pg — ABNORMAL LOW (ref 26.0–34.0)
MCHC: 32.6 g/dL (ref 30.0–36.0)
MCV: 79.1 fL — ABNORMAL LOW (ref 80.0–100.0)
Platelets: 343 10*3/uL (ref 150–400)
RBC: 4.69 MIL/uL (ref 3.87–5.11)
RDW: 15.1 % (ref 11.5–15.5)
WBC: 7.3 10*3/uL (ref 4.0–10.5)
nRBC: 0 % (ref 0.0–0.2)

## 2021-02-14 LAB — TROPONIN I (HIGH SENSITIVITY): Troponin I (High Sensitivity): <2 ng/L (ref <18)

## 2021-02-14 MED ORDER — LACTATED RINGERS IV BOLUS
1000.0000 mL | Freq: Once | INTRAVENOUS | Status: AC
  Administered 2021-02-14: 1000 mL via INTRAVENOUS

## 2021-02-14 MED ORDER — CHLORDIAZEPOXIDE HCL 25 MG PO CAPS: ORAL_CAPSULE | ORAL | 0 refills | Status: DC

## 2021-02-14 MED ORDER — ONDANSETRON 4 MG PO TBDP: 4.0000 mg | ORAL_TABLET | Freq: Three times a day (TID) | ORAL | 0 refills | Status: DC | PRN

## 2021-02-14 NOTE — ED Notes (Signed)
Message to EDP Rebekah that patient requests to have ears checked r/t dizziness.

## 2021-02-14 NOTE — ED Triage Notes (Signed)
C/o intermittent dizziness x 1 weeks. States gets a headache then starts to feel dizzy. Also gets chest pain intermittently. Symptoms occur with exertion.

## 2021-02-14 NOTE — ED Notes (Signed)
AVS reviewed with client, discussed staying hydrated and eating. Reviewed/ pt teaching provided in regards to the two (2) Rx's electronically sent to the pharmacy listed on pts EMR. Discussed safety while taking Librium and to take as directed. Also provided education on the Zofran Tabs ordered as well. Opportunity for questions provided. Significant other here with client to drive client home. Pt states she feels much better and feels very hungry.

## 2021-02-14 NOTE — ED Provider Notes (Signed)
[facility] EMERGENCY DEPARTMENT Provider Note   CSN: [id number] Arrival date & time: 02/14/21  1339     History Chief Complaint  Patient presents with   Dizziness    Gina Martin is a 30 y.o. female who presents with 1 week of intermittent lightheadedness and shortness of breath with exertion.  He has been vomiting many episodes of NBNB emesis on 4 separate occasions in the past week secondary to hangovers.  Does also endorse some sensation that her heart is racing during these episodes of lightheadedness.  Primarily occur when she is walking her right after she has changed position.  Patient states that she feels she is using alcohol in excess and would like help with decreasing her intake.  States she is drinking at least 6 beers a day but states that the desire to drink alcohol during the daytime often interrupts her ability to concentrate or perform daily activities.  I personally reads patient's medical records.  She is history of hyperlipidemia, asthma, schizophrenia, bipolar 1.  She is not on any medications every day.  HPI     Past Medical History:  Diagnosis Date   Asthma    Bipolar 1 disorder (HCC)    Hyperlipidemia    Preeclampsia    Schizophrenia (HCC)     There are no problems to display for this patient.   Past Surgical History:  Procedure Laterality Date   CESAREAN SECTION     TUBAL LIGATION       OB History     Gravida  3   Para  1   Term      Preterm      AB      Living         SAB      IAB      Ectopic      Multiple      Live Births              History reviewed. No pertinent family history.  Social History   Tobacco Use   Smoking status: Every Day    Packs/day: 1.00    Types: Cigarettes   Smokeless tobacco: Never  Vaping Use   Vaping Use: Never used  Substance Use Topics   Alcohol use: Yes    Comment: occ   Drug use: No    Home Medications Prior to Admission medications   Medication Sig  Start Date End Date Taking? Authorizing Provider  albuterol (PROVENTIL HFA;VENTOLIN HFA) 108 (90 Base) MCG/ACT inhaler Inhale 2 puffs into the lungs every 6 (six) hours as needed for wheezing or shortness of breath.   Yes [provider]  chlordiazePOXIDE (LIBRIUM) 25 MG capsule 50mg  PO TID x 1D, then 25-50mg  PO BID X 1D, then 25-50mg  PO QD X 1D 02/14/21  Yes Reo Portela R, PA-C  Multiple Vitamins-Minerals (MULTIVITAMIN ADULT PO) Take by mouth.   Yes [provider]  ondansetron (ZOFRAN ODT) 4 MG disintegrating tablet Take 1 tablet (4 mg total) by mouth every 8 (eight) hours as needed for nausea or vomiting. 02/14/21  Yes Lillar Bianca R, PA-C  IRON PO Take by mouth.    [provider]  ketorolac (TORADOL) 10 MG tablet Take 1 tablet (10 mg total) by mouth every 6 (six) hours as needed. 07/29/20   07/31/20, MD  methocarbamol (ROBAXIN) 500 MG tablet Take 2 tablets (1,000 mg total) by mouth 4 (four) times daily. 11/28/20   11/30/20, PA-C  omeprazole (  PRILOSEC) 20 MG capsule Take 1 capsule (20 mg total) by mouth daily. 05/21/19 07/29/20  Bethel Born, PA-C    Allergies    Shellfish allergy and Amoxicillin  Review of Systems   Review of Systems  Constitutional:  Positive for activity change and appetite change. Negative for chills, fatigue and fever.  HENT: Negative.    Respiratory:  Positive for shortness of breath. Negative for cough, choking and chest tightness.   Cardiovascular:  Positive for palpitations.  Gastrointestinal:  Positive for nausea and vomiting. Negative for diarrhea.  Genitourinary: Negative.   Neurological:  Positive for weakness and light-headedness. Negative for dizziness, syncope and headaches.   Physical Exam Updated Vital Signs BP 121/81   Pulse 79   Temp 98.4 F (36.9 C) (Oral)   Resp 14   Ht 5\' 11"  (1.803 m)   Wt 112 kg   LMP 01/24/2021 (Exact Date)   SpO2 100%   BMI 34.44 kg/m   Physical Exam Vitals  and nursing note reviewed.  Constitutional:      Appearance: She is obese. She is not ill-appearing or toxic-appearing.  HENT:     Head: Normocephalic and atraumatic.     Nose: Nose normal. No congestion.     Mouth/Throat:     Mouth: Mucous membranes are moist.     Pharynx: Oropharynx is clear. Uvula midline. No oropharyngeal exudate or posterior oropharyngeal erythema.     Tonsils: No tonsillar exudate.  Eyes:     General: Lids are normal. Vision grossly intact.        Right eye: No discharge.        Left eye: No discharge.     Extraocular Movements: Extraocular movements intact.     Conjunctiva/sclera: Conjunctivae normal.     Pupils: Pupils are equal, round, and reactive to light.  Neck:     Trachea: Trachea and phonation normal.  Cardiovascular:     Rate and Rhythm: Normal rate and regular rhythm.     Pulses: Normal pulses.     Heart sounds: Normal heart sounds. No murmur heard. Pulmonary:     Effort: Pulmonary effort is normal. No tachypnea, bradypnea, accessory muscle usage, prolonged expiration or respiratory distress.     Breath sounds: Normal breath sounds. No wheezing or rales.  Chest:     Chest wall: No mass, lacerations, deformity, swelling, tenderness, crepitus or edema.  Abdominal:     General: Bowel sounds are normal. There is no distension.     Palpations: Abdomen is soft.     Tenderness: There is no abdominal tenderness. There is no right CVA tenderness, left CVA tenderness, guarding or rebound.  Musculoskeletal:        General: No deformity.     Cervical back: Normal range of motion and neck supple. No edema, rigidity, tenderness or crepitus. No pain with movement, spinous process tenderness or muscular tenderness.     Right lower leg: No edema.     Left lower leg: No edema.  Lymphadenopathy:     Cervical: No cervical adenopathy.  Skin:    General: Skin is warm and dry.     Capillary Refill: Capillary refill takes less than 2 seconds.     Findings: No rash.   Neurological:     General: No focal deficit present.     Mental Status: She is alert and oriented to person, place, and time. Mental status is at baseline.     GCS: GCS eye subscore is 4. GCS verbal subscore is  5. GCS motor subscore is 6.     Gait: Gait is intact. Gait normal.     Comments: CIWA 0  Psychiatric:        Mood and Affect: Mood normal.    ED Results / Procedures / Treatments   Labs (all labs ordered are listed, but only abnormal results are displayed) Labs Reviewed  CBC - Abnormal; Notable for the following components:      Result Value   MCV 79.1 (*)    MCH 25.8 (*)    All other components within normal limits  BASIC METABOLIC PANEL  PREGNANCY, URINE  TROPONIN I (HIGH SENSITIVITY)    EKG EKG: NSR, no STEMI.   Radiology DG Chest 2 View  Result Date: 02/14/2021 CLINICAL DATA:  C/o intermittent dizziness x 1 weeks. EXAM: CHEST - 2 VIEW COMPARISON:  Chest radiograph 11/28/2020 FINDINGS: The cardiomediastinal contours are within normal limits. The lungs are clear. No pneumothorax or pleural effusion. No acute finding in the visualized skeleton. IMPRESSION: No active cardiopulmonary disease. Electronically Signed   By: Emmaline Kluver M.D.   On: 02/14/2021 14:58    Procedures Procedures   Medications Ordered in ED Medications  lactated ringers bolus 1,000 mL (0 mLs Intravenous Stopped 02/14/21 1826)    ED Course  I have reviewed the triage vital signs and the nursing notes.  Pertinent labs & imaging results that were available during my care of the patient were reviewed by me and considered in my medical decision making (see chart for details).    MDM Rules/Calculators/A&P                         30 year old female who presents with concern for episodes of lightheadedness with change of position with associated nausea and vomiting after drinking to excess.  Patient expresses concern she may be experiencing alcohol abuse.  Differential diagnosis  hypokalemia, dehydration, orthostasis, gastroenteritis.  Mildly hypertensive on intake vitals otherwise normal.  Cardiopulmonary exam normal, abdominal exam is benign.  Neurologic exam without focal deficit.  Patient borderline orthostatic on evaluation but without clear orthostasis.  Fluid liter bolus administered with improvement patient's symptoms.  Extensive discussion with the patient regarding the role of Librium and tapering off of alcohol.  Patient expresses proceed with Librium.  Will follow-up with her PCP.  Librium and Zofran as needed prescribed.  No further work-up warranted in ED as time.  Suspect patient symptoms were secondary to hypovolemia from alcohol abuse and recent nausea and vomiting.  Bricelyn voiced understanding for medical evaluation and treatment plan.  Each of her questions was answered to her expressed satisfaction.  Return precautions were given.  Patient is well-appearing, stable, and appropriate for discharge at this time.  This chart was dictated using voice recognition software, Dragon. Despite the best efforts of this provider to proofread and correct errors, errors may still occur which can change documentation meaning.   Final Clinical Impression(s) / ED Diagnoses Final diagnoses:  Dehydration    Rx / DC Orders ED Discharge Orders          Ordered    ondansetron (ZOFRAN ODT) 4 MG disintegrating tablet  Every 8 hours PRN        02/14/21 1822    chlordiazePOXIDE (LIBRIUM) 25 MG capsule        02/14/21 [address] Idelia Salm 02/14/21 1830    Gloris Manchester, MD 02/16/21  1538  

## 2021-02-14 NOTE — Discharge Instructions (Addendum)
You were seen in the ER today for your lightheadedness.  Suspect is secondary to mild dehydration likely related to your vomiting after recent hangover.  I would recommend you increase your hydration at home and incorporate electrolyte drinks.  Additionally would recommend that you stop drinking alcohol to excess.  He may drink in moderation.  Follow-up with your primary care doctor and return to the ER with any new severe symptoms.

## 2021-03-31 ENCOUNTER — Emergency Department (HOSPITAL_BASED_OUTPATIENT_CLINIC_OR_DEPARTMENT_OTHER): Payer: Medicaid Other

## 2021-03-31 ENCOUNTER — Other Ambulatory Visit: Payer: Self-pay

## 2021-03-31 ENCOUNTER — Emergency Department (HOSPITAL_BASED_OUTPATIENT_CLINIC_OR_DEPARTMENT_OTHER)
Admission: EM | Admit: 2021-03-31 | Discharge: 2021-03-31 | Disposition: A | Discharge status: Home / Self Care | Payer: Medicaid Other | Attending: Emergency Medicine | Admitting: Emergency Medicine

## 2021-03-31 ENCOUNTER — Encounter (HOSPITAL_BASED_OUTPATIENT_CLINIC_OR_DEPARTMENT_OTHER): Payer: Self-pay | Admitting: Emergency Medicine

## 2021-03-31 DIAGNOSIS — J069 Acute upper respiratory infection, unspecified: Secondary | ICD-10-CM | POA: Diagnosis not present

## 2021-03-31 DIAGNOSIS — F17210 Nicotine dependence, cigarettes, uncomplicated: Secondary | ICD-10-CM | POA: Diagnosis not present

## 2021-03-31 DIAGNOSIS — H538 Other visual disturbances: Secondary | ICD-10-CM | POA: Insufficient documentation

## 2021-03-31 DIAGNOSIS — J45909 Unspecified asthma, uncomplicated: Secondary | ICD-10-CM | POA: Insufficient documentation

## 2021-03-31 DIAGNOSIS — R059 Cough, unspecified: Secondary | ICD-10-CM | POA: Diagnosis present

## 2021-03-31 LAB — CBG MONITORING, ED: Glucose-Capillary: 104 mg/dL — ABNORMAL HIGH (ref 70–99)

## 2021-03-31 NOTE — ED Triage Notes (Signed)
Pt states she started having blurry vision yesterday afternoon.  Pt states she started to have cough/sob this am.  No fevers.  Pt states she has some left shoulder pain.

## 2021-03-31 NOTE — ED Notes (Signed)
Patient ambulated form lobby  to room 5, SpO2 100% HR 98-112 max, denies DOE, endorses being dizzy.

## 2021-03-31 NOTE — ED Provider Notes (Signed)
[name] EMERGENCY DEPARTMENT Provider Note   CSN: [id number] Arrival date & time: 03/31/21  [id number]     History Chief Complaint  Patient presents with   Blurred Vision   Cough    Gina Martin is a 30 y.o. female.  Patient presents ER chief complaint of cough, associated with left-sided chest tightness, left eye intermittent blurry vision.  Symptoms have been going on since yesterday.  Denies any fevers denies vomiting denies diarrhea.  Denies any chest pain currently.  She states that she has had a history of seeing spots in both eyes for a year intermittently.  She was seen by optometry and told everything looked fine.  She noticed blurry vision in the left eye starting yesterday.      Past Medical History:  Diagnosis Date   Asthma    Bipolar 1 disorder ([place])    Hyperlipidemia    Preeclampsia    Schizophrenia ([place])     There are no problems to display for this patient.   Past Surgical History:  Procedure Laterality Date   CESAREAN SECTION     TUBAL LIGATION       OB History     Gravida  3   Para  1   Term      Preterm      AB      Living         SAB      IAB      Ectopic      Multiple      Live Births              History reviewed. No pertinent family history.  Social History   Tobacco Use   Smoking status: Every Day    Packs/day: 1.00    Types: Cigarettes   Smokeless tobacco: Never  Vaping Use   Vaping Use: Never used  Substance Use Topics   Alcohol use: Yes    Comment: occ   Drug use: No    Home Medications Prior to Admission medications   Medication Sig Start Date End Date Taking? Authorizing Provider  albuterol (PROVENTIL HFA;VENTOLIN HFA) 108 (90 Base) MCG/ACT inhaler Inhale 2 puffs into the lungs every 6 (six) hours as needed for wheezing or shortness of breath.    [provider]  chlordiazePOXIDE (LIBRIUM) 25 MG capsule 50mg  PO TID x 1D, then 25-50mg  PO BID X 1D, then 25-50mg  PO QD X 1D  02/14/21   Sponseller, Rebekah R, PA-C  IRON PO Take by mouth.    [provider]  ketorolac (TORADOL) 10 MG tablet Take 1 tablet (10 mg total) by mouth every 6 (six) hours as needed. 07/29/20   Arnaldo Natal, MD  methocarbamol (ROBAXIN) 500 MG tablet Take 2 tablets (1,000 mg total) by mouth 4 (four) times daily. 11/28/20   Carlisle Cater, PA-C  Multiple Vitamins-Minerals (MULTIVITAMIN ADULT PO) Take by mouth.    [provider]  ondansetron (ZOFRAN ODT) 4 MG disintegrating tablet Take 1 tablet (4 mg total) by mouth every 8 (eight) hours as needed for nausea or vomiting. 02/14/21   Sponseller, Gypsy Balsam, PA-C  omeprazole (PRILOSEC) 20 MG capsule Take 1 capsule (20 mg total) by mouth daily. 05/21/19 07/29/20  Recardo Evangelist, PA-C    Allergies    Shellfish allergy and Amoxicillin  Review of Systems   Review of Systems  Constitutional:  Negative for fever.  HENT:  Negative for ear pain.   Eyes:  Positive for  visual disturbance. Negative for pain.  Respiratory:  Negative for cough.   Cardiovascular:  Negative for chest pain.  Gastrointestinal:  Negative for abdominal pain.  Genitourinary:  Negative for flank pain.  Musculoskeletal:  Negative for back pain.  Skin:  Negative for rash.  Neurological:  Negative for headaches.   Physical Exam Updated Vital Signs BP (!) 117/97   Pulse 91   Temp 98 F (36.7 C) (Oral)   Resp (!) 23   Ht 5\' 11"  (1.803 m)   Wt 116.6 kg   LMP 03/25/2021 (Approximate)   SpO2 100%   BMI 35.84 kg/m   Physical Exam Constitutional:      General: She is not in acute distress.    Appearance: Normal appearance.  HENT:     Head: Normocephalic.     Nose: Nose normal.  Eyes:     General:        Right eye: No discharge.        Left eye: No discharge.     Extraocular Movements: Extraocular movements intact.     Conjunctiva/sclera: Conjunctivae normal.     Pupils: Pupils are equal, round, and reactive to light.  Cardiovascular:     Rate  and Rhythm: Normal rate.  Pulmonary:     Effort: Pulmonary effort is normal.  Musculoskeletal:        General: Normal range of motion.     Cervical back: Normal range of motion.  Neurological:     General: No focal deficit present.     Mental Status: She is alert. Mental status is at baseline.    ED Results / Procedures / Treatments   Labs (all labs ordered are listed, but only abnormal results are displayed) Labs Reviewed  CBG MONITORING, ED - Abnormal; Notable for the following components:      Result Value   Glucose-Capillary 104 (*)    All other components within normal limits    EKG EKG Interpretation  Date/Time:  Wednesday March 31 2021 09:25:35 EST Ventricular Rate:  89 PR Interval:  152 QRS Duration: 74 QT Interval:  361 QTC Calculation: 440 R Axis:   70 Text Interpretation: Age not entered, assumed to be  30 years old for purpose of ECG interpretation Sinus rhythm ST elev, probable normal early repol pattern Confirmed by [id number] ([id number]) on 03/31/2021 10:25:54 AM  Radiology DG Chest Port 1 View  Result Date: 03/31/2021 CLINICAL DATA:  Cough.  Shortness of breath and chest pain. EXAM: PORTABLE CHEST 1 VIEW COMPARISON:  02/14/2021 FINDINGS: Numerous leads and wires project over the chest. Midline trachea. Normal heart size and mediastinal contours. No pleural effusion or pneumothorax. Clear lungs. IMPRESSION: No active disease. Electronically Signed   By: 02/16/2021 M.D.   On: 03/31/2021 10:24    Procedures Procedures   Medications Ordered in ED Medications - No data to display  ED Course  I have reviewed the triage vital signs and the nursing notes.  Pertinent labs & imaging results that were available during my care of the patient were reviewed by me and considered in my medical decision making (see chart for details).    MDM Rules/Calculators/A&P                           EKG shows sinus rhythm, normal rate, no ST elevations or depressions are  noted.  Chest x-ray is unremarkable per radiology.  Patient was concerned about diabetes as a cause for her  vision issues, point-of-care Accu-Chek appears normal range.  Will recommend outpatient follow-up with nephrology within the week, recommending immediate return for worsening symptoms pain difficulty breathing or any additional concerns.  Final Clinical Impression(s) / ED Diagnoses Final diagnoses:  Upper respiratory tract infection, unspecified type  Blurry vision, left eye    Rx / DC Orders ED Discharge Orders     None        Luna Fuse, MD 03/31/21 1032

## 2021-03-31 NOTE — Discharge Instructions (Addendum)
Your blood sugar test was normal.  Visual acuity for both eyes was equal as well.  Your chest x-ray did not show any signs of pneumonia.  Follow-up with ophthalmology (eye doctor) within the next week.  Return immediately back to the ER if:  Your symptoms worsen within the next 12-24 hours. You develop new symptoms such as new fevers, persistent vomiting, new pain, shortness of breath, or new weakness or numbness, or if you have any other concerns.

## 2021-05-07 ENCOUNTER — Emergency Department (HOSPITAL_COMMUNITY): Payer: Medicaid Other

## 2021-05-07 ENCOUNTER — Encounter (HOSPITAL_COMMUNITY): Payer: Self-pay

## 2021-05-07 ENCOUNTER — Inpatient Hospital Stay (HOSPITAL_COMMUNITY): Payer: Medicaid Other

## 2021-05-07 ENCOUNTER — Other Ambulatory Visit: Payer: Self-pay

## 2021-05-07 ENCOUNTER — Encounter (HOSPITAL_COMMUNITY)
Admission: EM | Disposition: A | Discharge status: Home Health | Payer: Self-pay | Source: Home / Self Care | Attending: Student

## 2021-05-07 ENCOUNTER — Inpatient Hospital Stay (HOSPITAL_COMMUNITY)
Admission: EM | Admit: 2021-05-07 | Discharge: 2021-05-11 | DRG: 492 | Disposition: A | Discharge status: Home Health | Payer: Medicaid Other | Attending: Student | Admitting: Student

## 2021-05-07 ENCOUNTER — Inpatient Hospital Stay (HOSPITAL_COMMUNITY): Payer: Medicaid Other | Admitting: Certified Registered Nurse Anesthetist

## 2021-05-07 DIAGNOSIS — J45909 Unspecified asthma, uncomplicated: Secondary | ICD-10-CM | POA: Diagnosis present

## 2021-05-07 DIAGNOSIS — Z23 Encounter for immunization: Secondary | ICD-10-CM | POA: Diagnosis not present

## 2021-05-07 DIAGNOSIS — F10920 Alcohol use, unspecified with intoxication, uncomplicated: Secondary | ICD-10-CM

## 2021-05-07 DIAGNOSIS — S82871C Displaced pilon fracture of right tibia, initial encounter for open fracture type IIIA, IIIB, or IIIC: Principal | ICD-10-CM | POA: Diagnosis present

## 2021-05-07 DIAGNOSIS — Z20822 Contact with and (suspected) exposure to covid-19: Secondary | ICD-10-CM | POA: Diagnosis present

## 2021-05-07 DIAGNOSIS — S0081XA Abrasion of other part of head, initial encounter: Secondary | ICD-10-CM | POA: Diagnosis present

## 2021-05-07 DIAGNOSIS — S82851B Displaced trimalleolar fracture of right lower leg, initial encounter for open fracture type I or II: Secondary | ICD-10-CM

## 2021-05-07 DIAGNOSIS — S82121A Displaced fracture of lateral condyle of right tibia, initial encounter for closed fracture: Secondary | ICD-10-CM

## 2021-05-07 DIAGNOSIS — T148XXA Other injury of unspecified body region, initial encounter: Secondary | ICD-10-CM

## 2021-05-07 DIAGNOSIS — S82002A Unspecified fracture of left patella, initial encounter for closed fracture: Secondary | ICD-10-CM

## 2021-05-07 DIAGNOSIS — F17210 Nicotine dependence, cigarettes, uncomplicated: Secondary | ICD-10-CM | POA: Diagnosis present

## 2021-05-07 DIAGNOSIS — S82142A Displaced bicondylar fracture of left tibia, initial encounter for closed fracture: Secondary | ICD-10-CM | POA: Diagnosis present

## 2021-05-07 DIAGNOSIS — Z419 Encounter for procedure for purposes other than remedying health state, unspecified: Secondary | ICD-10-CM

## 2021-05-07 DIAGNOSIS — Z91013 Allergy to seafood: Secondary | ICD-10-CM

## 2021-05-07 DIAGNOSIS — Z560 Unemployment, unspecified: Secondary | ICD-10-CM | POA: Diagnosis not present

## 2021-05-07 DIAGNOSIS — S82141A Displaced bicondylar fracture of right tibia, initial encounter for closed fracture: Secondary | ICD-10-CM

## 2021-05-07 DIAGNOSIS — F319 Bipolar disorder, unspecified: Secondary | ICD-10-CM | POA: Diagnosis present

## 2021-05-07 DIAGNOSIS — S82831B Other fracture of upper and lower end of right fibula, initial encounter for open fracture type I or II: Secondary | ICD-10-CM | POA: Diagnosis present

## 2021-05-07 DIAGNOSIS — M25571 Pain in right ankle and joints of right foot: Secondary | ICD-10-CM | POA: Diagnosis present

## 2021-05-07 DIAGNOSIS — S82042B Displaced comminuted fracture of left patella, initial encounter for open fracture type I or II: Secondary | ICD-10-CM | POA: Diagnosis present

## 2021-05-07 DIAGNOSIS — E785 Hyperlipidemia, unspecified: Secondary | ICD-10-CM | POA: Diagnosis present

## 2021-05-07 DIAGNOSIS — S82891B Other fracture of right lower leg, initial encounter for open fracture type I or II: Secondary | ICD-10-CM

## 2021-05-07 HISTORY — PX: EXTERNAL FIXATION LEG: SHX1549

## 2021-05-07 HISTORY — PX: ORIF PATELLA: SHX5033

## 2021-05-07 LAB — I-STAT BETA HCG BLOOD, ED (MC, WL, AP ONLY): I-stat hCG, quantitative: <5 m[IU]/mL (ref <5)

## 2021-05-07 LAB — TYPE AND SCREEN
ABO/RH(D): O POS
ABO/RH(D): O POS
Antibody Screen: NEGATIVE
Antibody Screen: NEGATIVE

## 2021-05-07 LAB — CBC WITH DIFFERENTIAL/PLATELET
Abs Immature Granulocytes: 0.07 10*3/uL (ref 0.00–0.07)
Basophils Absolute: 0.1 10*3/uL (ref 0.0–0.1)
Basophils Relative: 1 %
Eosinophils Absolute: 0.1 10*3/uL (ref 0.0–0.5)
Eosinophils Relative: 1 %
HCT: 39.7 % (ref 36.0–46.0)
Hemoglobin: 12.5 g/dL (ref 12.0–15.0)
Immature Granulocytes: 1 %
Lymphocytes Relative: 26 %
Lymphs Abs: 2.6 10*3/uL (ref 0.7–4.0)
MCH: 25.9 pg — ABNORMAL LOW (ref 26.0–34.0)
MCHC: 31.5 g/dL (ref 30.0–36.0)
MCV: 82.4 fL (ref 80.0–100.0)
Monocytes Absolute: 0.6 10*3/uL (ref 0.1–1.0)
Monocytes Relative: 6 %
Neutro Abs: 6.7 10*3/uL (ref 1.7–7.7)
Neutrophils Relative %: 65 %
Platelets: 423 10*3/uL — ABNORMAL HIGH (ref 150–400)
RBC: 4.82 MIL/uL (ref 3.87–5.11)
RDW: 14.5 % (ref 11.5–15.5)
WBC: 10.1 10*3/uL (ref 4.0–10.5)
nRBC: 0 % (ref 0.0–0.2)

## 2021-05-07 LAB — COMPREHENSIVE METABOLIC PANEL
ALT: 16 U/L (ref 0–44)
AST: 18 U/L (ref 15–41)
Albumin: 4.1 g/dL (ref 3.5–5.0)
Alkaline Phosphatase: 83 U/L (ref 38–126)
Anion gap: 10 (ref 5–15)
BUN: 10 mg/dL (ref 6–20)
CO2: 23 mmol/L (ref 22–32)
Calcium: 9.2 mg/dL (ref 8.9–10.3)
Chloride: 107 mmol/L (ref 98–111)
Creatinine, Ser: 0.73 mg/dL (ref 0.44–1.00)
GFR, Estimated: >60 mL/min (ref >60)
Glucose, Bld: 138 mg/dL — ABNORMAL HIGH (ref 70–99)
Potassium: 3.4 mmol/L — ABNORMAL LOW (ref 3.5–5.1)
Sodium: 140 mmol/L (ref 135–145)
Total Bilirubin: 0.4 mg/dL (ref 0.3–1.2)
Total Protein: 8.4 g/dL — ABNORMAL HIGH (ref 6.5–8.1)

## 2021-05-07 LAB — RESP PANEL BY RT-PCR (FLU A&B, COVID) ARPGX2
Influenza A by PCR: NEGATIVE
Influenza B by PCR: NEGATIVE
SARS Coronavirus 2 by RT PCR: NEGATIVE

## 2021-05-07 LAB — ETHANOL: Alcohol, Ethyl (B): 114 mg/dL — ABNORMAL HIGH (ref <10)

## 2021-05-07 LAB — SURGICAL PCR SCREEN
MRSA, PCR: NEGATIVE
Staphylococcus aureus: POSITIVE — AB

## 2021-05-07 LAB — ABO/RH: ABO/RH(D): O POS

## 2021-05-07 LAB — HIV ANTIBODY (ROUTINE TESTING W REFLEX): HIV Screen 4th Generation wRfx: NONREACTIVE

## 2021-05-07 SURGERY — EXTERNAL FIXATION, LOWER EXTREMITY
Anesthesia: Regional | Site: Knee | Laterality: Right

## 2021-05-07 MED ORDER — SODIUM CHLORIDE 0.9 % IV SOLN: Freq: Once | INTRAVENOUS | Status: AC

## 2021-05-07 MED ORDER — MUPIROCIN 2 % EX OINT
1.0000 "application " | TOPICAL_OINTMENT | Freq: Two times a day (BID) | CUTANEOUS | Status: DC
  Administered 2021-05-08 – 2021-05-11 (×7): 1 via NASAL
  Filled 2021-05-07 (×2): qty 22

## 2021-05-07 MED ORDER — HYDROMORPHONE HCL 1 MG/ML IJ SOLN
0.5000 mg | INTRAMUSCULAR | Status: DC | PRN
  Administered 2021-05-07 – 2021-05-11 (×7): 1 mg via INTRAVENOUS
  Filled 2021-05-07 (×7): qty 1

## 2021-05-07 MED ORDER — FENTANYL CITRATE (PF) 100 MCG/2ML IJ SOLN: 50.0000 ug | Freq: Once | INTRAMUSCULAR | Status: AC

## 2021-05-07 MED ORDER — SUCCINYLCHOLINE CHLORIDE 200 MG/10ML IV SOSY
PREFILLED_SYRINGE | INTRAVENOUS | Status: AC
  Filled 2021-05-07: qty 10

## 2021-05-07 MED ORDER — SODIUM CHLORIDE (PF) 0.9 % IJ SOLN
INTRAMUSCULAR | Status: AC
  Filled 2021-05-07: qty 50

## 2021-05-07 MED ORDER — MIDAZOLAM HCL 2 MG/2ML IJ SOLN
INTRAMUSCULAR | Status: AC
  Administered 2021-05-07: 1 mg
  Filled 2021-05-07: qty 2

## 2021-05-07 MED ORDER — DEXAMETHASONE SODIUM PHOSPHATE 10 MG/ML IJ SOLN
INTRAMUSCULAR | Status: AC
  Filled 2021-05-07: qty 1

## 2021-05-07 MED ORDER — VANCOMYCIN HCL 1000 MG IV SOLR
INTRAVENOUS | Status: AC
  Filled 2021-05-07: qty 20

## 2021-05-07 MED ORDER — ENOXAPARIN SODIUM 40 MG/0.4ML IJ SOSY
40.0000 mg | PREFILLED_SYRINGE | INTRAMUSCULAR | Status: DC
  Administered 2021-05-08 – 2021-05-11 (×3): 40 mg via SUBCUTANEOUS
  Filled 2021-05-07 (×4): qty 0.4

## 2021-05-07 MED ORDER — SUCCINYLCHOLINE CHLORIDE 200 MG/10ML IV SOSY
PREFILLED_SYRINGE | INTRAVENOUS | Status: DC | PRN
  Administered 2021-05-07: 120 mg via INTRAVENOUS

## 2021-05-07 MED ORDER — PROPOFOL 10 MG/ML IV BOLUS
INTRAVENOUS | Status: AC
  Filled 2021-05-07: qty 20

## 2021-05-07 MED ORDER — ONDANSETRON HCL 4 MG/2ML IJ SOLN: 4.0000 mg | Freq: Once | INTRAMUSCULAR | Status: DC | PRN

## 2021-05-07 MED ORDER — OXYCODONE HCL 5 MG PO TABS
5.0000 mg | ORAL_TABLET | ORAL | Status: DC | PRN
  Administered 2021-05-08 – 2021-05-10 (×3): 10 mg via ORAL
  Filled 2021-05-07 (×4): qty 2

## 2021-05-07 MED ORDER — FENTANYL CITRATE (PF) 250 MCG/5ML IJ SOLN
INTRAMUSCULAR | Status: AC
  Filled 2021-05-07: qty 5

## 2021-05-07 MED ORDER — CHLORHEXIDINE GLUCONATE 0.12 % MT SOLN: 15.0000 mL | Freq: Once | OROMUCOSAL | Status: AC

## 2021-05-07 MED ORDER — VANCOMYCIN HCL 1000 MG IV SOLR
INTRAVENOUS | Status: DC | PRN
  Administered 2021-05-07 (×2): 1000 mg via TOPICAL

## 2021-05-07 MED ORDER — ONDANSETRON HCL 4 MG/2ML IJ SOLN
4.0000 mg | Freq: Four times a day (QID) | INTRAMUSCULAR | Status: DC | PRN
Start: 2021-05-07 — End: 2021-05-11

## 2021-05-07 MED ORDER — ACETAMINOPHEN 325 MG PO TABS
650.0000 mg | ORAL_TABLET | Freq: Four times a day (QID) | ORAL | Status: DC
  Administered 2021-05-07 – 2021-05-11 (×14): 650 mg via ORAL
  Filled 2021-05-07 (×14): qty 2

## 2021-05-07 MED ORDER — HYDROMORPHONE HCL 1 MG/ML IJ SOLN
0.2500 mg | INTRAMUSCULAR | Status: DC | PRN
  Administered 2021-05-07 (×4): 0.5 mg via INTRAVENOUS

## 2021-05-07 MED ORDER — OXYCODONE HCL 5 MG PO TABS
ORAL_TABLET | ORAL | Status: AC
  Filled 2021-05-07: qty 1

## 2021-05-07 MED ORDER — MIDAZOLAM HCL 2 MG/2ML IJ SOLN
INTRAMUSCULAR | Status: AC
  Filled 2021-05-07: qty 2

## 2021-05-07 MED ORDER — OXYCODONE HCL 5 MG PO TABS
10.0000 mg | ORAL_TABLET | ORAL | Status: DC | PRN
  Administered 2021-05-08 – 2021-05-11 (×5): 15 mg via ORAL
  Filled 2021-05-07 (×7): qty 3

## 2021-05-07 MED ORDER — ONDANSETRON HCL 4 MG/2ML IJ SOLN
4.0000 mg | Freq: Once | INTRAMUSCULAR | Status: AC
  Administered 2021-05-07: 4 mg via INTRAVENOUS
  Filled 2021-05-07: qty 2

## 2021-05-07 MED ORDER — SUGAMMADEX SODIUM 200 MG/2ML IV SOLN
INTRAVENOUS | Status: DC | PRN
  Administered 2021-05-07: 200 mg via INTRAVENOUS

## 2021-05-07 MED ORDER — HYDROMORPHONE HCL 1 MG/ML IJ SOLN
1.0000 mg | Freq: Once | INTRAMUSCULAR | Status: AC | PRN
Start: 2021-05-07 — End: 2021-05-07
  Administered 2021-05-07: 1 mg via INTRAVENOUS
  Filled 2021-05-07: qty 1

## 2021-05-07 MED ORDER — TETANUS-DIPHTH-ACELL PERTUSSIS 5-2.5-18.5 LF-MCG/0.5 IM SUSY
0.5000 mL | PREFILLED_SYRINGE | Freq: Once | INTRAMUSCULAR | Status: AC
Start: 2021-05-07 — End: 2021-05-07
  Administered 2021-05-07: 0.5 mL via INTRAMUSCULAR
  Filled 2021-05-07: qty 0.5

## 2021-05-07 MED ORDER — MIDAZOLAM HCL 2 MG/2ML IJ SOLN: 1.0000 mg | Freq: Once | INTRAMUSCULAR | Status: AC

## 2021-05-07 MED ORDER — HYDROMORPHONE HCL 1 MG/ML IJ SOLN
INTRAMUSCULAR | Status: DC | PRN
  Administered 2021-05-07: .5 mg via INTRAVENOUS

## 2021-05-07 MED ORDER — ONDANSETRON HCL 4 MG PO TABS: 4.0000 mg | ORAL_TABLET | Freq: Four times a day (QID) | ORAL | Status: DC | PRN

## 2021-05-07 MED ORDER — ORAL CARE MOUTH RINSE: 15.0000 mL | Freq: Once | OROMUCOSAL | Status: AC

## 2021-05-07 MED ORDER — ONDANSETRON HCL 4 MG/2ML IJ SOLN
4.0000 mg | Freq: Four times a day (QID) | INTRAMUSCULAR | Status: DC | PRN
  Administered 2021-05-07: 4 mg via INTRAVENOUS
  Filled 2021-05-07: qty 2

## 2021-05-07 MED ORDER — METHOCARBAMOL 500 MG PO TABS
500.0000 mg | ORAL_TABLET | Freq: Four times a day (QID) | ORAL | Status: DC | PRN
  Administered 2021-05-09 – 2021-05-11 (×5): 500 mg via ORAL
  Filled 2021-05-07 (×6): qty 1

## 2021-05-07 MED ORDER — METOCLOPRAMIDE HCL 5 MG PO TABS: 5.0000 mg | ORAL_TABLET | Freq: Three times a day (TID) | ORAL | Status: DC | PRN

## 2021-05-07 MED ORDER — HYDRALAZINE HCL 10 MG PO TABS: 10.0000 mg | ORAL_TABLET | Freq: Four times a day (QID) | ORAL | Status: DC | PRN

## 2021-05-07 MED ORDER — METOCLOPRAMIDE HCL 5 MG/ML IJ SOLN: 5.0000 mg | Freq: Three times a day (TID) | INTRAMUSCULAR | Status: DC | PRN

## 2021-05-07 MED ORDER — CEFAZOLIN SODIUM-DEXTROSE 2-4 GM/100ML-% IV SOLN: 2.0000 g | Freq: Three times a day (TID) | INTRAVENOUS | Status: DC

## 2021-05-07 MED ORDER — KETOROLAC TROMETHAMINE 15 MG/ML IJ SOLN
15.0000 mg | Freq: Four times a day (QID) | INTRAMUSCULAR | Status: AC
  Administered 2021-05-08 (×3): 15 mg via INTRAVENOUS
  Filled 2021-05-07 (×3): qty 1

## 2021-05-07 MED ORDER — CHLORHEXIDINE GLUCONATE 0.12 % MT SOLN
OROMUCOSAL | Status: AC
  Administered 2021-05-07: 15 mL via OROMUCOSAL
  Filled 2021-05-07: qty 15

## 2021-05-07 MED ORDER — OXYCODONE HCL 5 MG/5ML PO SOLN: 5.0000 mg | Freq: Once | ORAL | Status: AC | PRN

## 2021-05-07 MED ORDER — HYDROMORPHONE HCL 1 MG/ML IJ SOLN
INTRAMUSCULAR | Status: AC
  Filled 2021-05-07: qty 2

## 2021-05-07 MED ORDER — HYDROMORPHONE HCL 1 MG/ML IJ SOLN
1.0000 mg | Freq: Once | INTRAMUSCULAR | Status: AC | PRN
  Administered 2021-05-07: 1 mg via INTRAVENOUS
  Filled 2021-05-07: qty 1

## 2021-05-07 MED ORDER — CEFAZOLIN SODIUM-DEXTROSE 2-4 GM/100ML-% IV SOLN
INTRAVENOUS | Status: AC
  Filled 2021-05-07: qty 100

## 2021-05-07 MED ORDER — ROCURONIUM BROMIDE 10 MG/ML (PF) SYRINGE
PREFILLED_SYRINGE | INTRAVENOUS | Status: DC | PRN
  Administered 2021-05-07: 30 mg via INTRAVENOUS
  Administered 2021-05-07: 50 mg via INTRAVENOUS

## 2021-05-07 MED ORDER — ROCURONIUM BROMIDE 10 MG/ML (PF) SYRINGE
PREFILLED_SYRINGE | INTRAVENOUS | Status: AC
  Filled 2021-05-07: qty 10

## 2021-05-07 MED ORDER — MORPHINE SULFATE (PF) 2 MG/ML IV SOLN
2.0000 mg | INTRAVENOUS | Status: DC | PRN
  Administered 2021-05-07: 2 mg via INTRAVENOUS
  Filled 2021-05-07: qty 1

## 2021-05-07 MED ORDER — OXYCODONE HCL 5 MG PO TABS: 5.0000 mg | ORAL_TABLET | ORAL | Status: DC | PRN

## 2021-05-07 MED ORDER — KETOROLAC TROMETHAMINE 30 MG/ML IJ SOLN
INTRAMUSCULAR | Status: DC | PRN
  Administered 2021-05-07: 30 mg via INTRAVENOUS

## 2021-05-07 MED ORDER — POVIDONE-IODINE 10 % EX SWAB: 2.0000 "application " | Freq: Once | CUTANEOUS | Status: DC

## 2021-05-07 MED ORDER — ONDANSETRON HCL 4 MG/2ML IJ SOLN
INTRAMUSCULAR | Status: AC
  Filled 2021-05-07: qty 2

## 2021-05-07 MED ORDER — FENTANYL CITRATE (PF) 100 MCG/2ML IJ SOLN
INTRAMUSCULAR | Status: AC
  Administered 2021-05-07: 50 ug
  Filled 2021-05-07: qty 2

## 2021-05-07 MED ORDER — SODIUM CHLORIDE 0.9 % IV SOLN: INTRAVENOUS | Status: DC

## 2021-05-07 MED ORDER — IOHEXOL 350 MG/ML SOLN
100.0000 mL | Freq: Once | INTRAVENOUS | Status: AC | PRN
  Administered 2021-05-07: 100 mL via INTRAVENOUS

## 2021-05-07 MED ORDER — FENTANYL CITRATE PF 50 MCG/ML IJ SOSY
50.0000 ug | PREFILLED_SYRINGE | INTRAMUSCULAR | Status: DC | PRN
  Administered 2021-05-07 (×3): 50 ug via INTRAVENOUS
  Filled 2021-05-07 (×4): qty 1

## 2021-05-07 MED ORDER — 0.9 % SODIUM CHLORIDE (POUR BTL) OPTIME
TOPICAL | Status: DC | PRN
  Administered 2021-05-07: 1000 mL

## 2021-05-07 MED ORDER — METHOCARBAMOL 1000 MG/10ML IJ SOLN
500.0000 mg | Freq: Four times a day (QID) | INTRAVENOUS | Status: DC | PRN
  Filled 2021-05-07: qty 5

## 2021-05-07 MED ORDER — SODIUM CHLORIDE 0.9 % IV SOLN
2.0000 g | INTRAVENOUS | Status: AC
  Administered 2021-05-07 – 2021-05-09 (×3): 2 g via INTRAVENOUS
  Filled 2021-05-07 (×3): qty 20

## 2021-05-07 MED ORDER — CHLORHEXIDINE GLUCONATE CLOTH 2 % EX PADS
6.0000 | MEDICATED_PAD | Freq: Every day | CUTANEOUS | Status: DC
  Administered 2021-05-08 – 2021-05-10 (×3): 6 via TOPICAL

## 2021-05-07 MED ORDER — ROPIVACAINE HCL 5 MG/ML IJ SOLN
INTRAMUSCULAR | Status: DC | PRN
  Administered 2021-05-07: 30 mL via PERINEURAL

## 2021-05-07 MED ORDER — DOCUSATE SODIUM 100 MG PO CAPS
100.0000 mg | ORAL_CAPSULE | Freq: Two times a day (BID) | ORAL | Status: DC
  Administered 2021-05-07 – 2021-05-11 (×7): 100 mg via ORAL
  Filled 2021-05-07 (×8): qty 1

## 2021-05-07 MED ORDER — CHLORHEXIDINE GLUCONATE 4 % EX LIQD
60.0000 mL | Freq: Once | CUTANEOUS | Status: DC
  Filled 2021-05-07: qty 60

## 2021-05-07 MED ORDER — MUPIROCIN 2 % EX OINT: 1.0000 "application " | TOPICAL_OINTMENT | Freq: Once | CUTANEOUS | Status: DC

## 2021-05-07 MED ORDER — KETOROLAC TROMETHAMINE 30 MG/ML IJ SOLN
INTRAMUSCULAR | Status: AC
  Filled 2021-05-07: qty 1

## 2021-05-07 MED ORDER — LACTATED RINGERS IV SOLN: INTRAVENOUS | Status: DC

## 2021-05-07 MED ORDER — POLYETHYLENE GLYCOL 3350 17 G PO PACK: 17.0000 g | PACK | Freq: Every day | ORAL | Status: DC | PRN

## 2021-05-07 MED ORDER — CEFAZOLIN SODIUM-DEXTROSE 2-4 GM/100ML-% IV SOLN
2.0000 g | Freq: Once | INTRAVENOUS | Status: AC
  Administered 2021-05-07: 2 g via INTRAVENOUS
  Filled 2021-05-07: qty 100

## 2021-05-07 MED ORDER — OXYCODONE HCL 5 MG PO TABS
5.0000 mg | ORAL_TABLET | Freq: Once | ORAL | Status: AC | PRN
  Administered 2021-05-07: 5 mg via ORAL

## 2021-05-07 MED ORDER — MUPIROCIN 2 % EX OINT
TOPICAL_OINTMENT | CUTANEOUS | Status: AC
  Filled 2021-05-07: qty 22

## 2021-05-07 MED ORDER — ESMOLOL HCL 100 MG/10ML IV SOLN
INTRAVENOUS | Status: DC | PRN
  Administered 2021-05-07: 30 mg via INTRAVENOUS

## 2021-05-07 MED ORDER — LIDOCAINE 2% (20 MG/ML) 5 ML SYRINGE
INTRAMUSCULAR | Status: AC
  Filled 2021-05-07: qty 5

## 2021-05-07 MED ORDER — MIDAZOLAM HCL 5 MG/5ML IJ SOLN
INTRAMUSCULAR | Status: DC | PRN
  Administered 2021-05-07: 2 mg via INTRAVENOUS

## 2021-05-07 MED ORDER — DEXAMETHASONE SODIUM PHOSPHATE 10 MG/ML IJ SOLN
INTRAMUSCULAR | Status: DC | PRN
  Administered 2021-05-07: 10 mg via INTRAVENOUS

## 2021-05-07 MED ORDER — ESMOLOL HCL 100 MG/10ML IV SOLN
INTRAVENOUS | Status: AC
  Filled 2021-05-07: qty 10

## 2021-05-07 MED ORDER — FENTANYL CITRATE (PF) 250 MCG/5ML IJ SOLN
INTRAMUSCULAR | Status: DC | PRN
  Administered 2021-05-07: 100 ug via INTRAVENOUS

## 2021-05-07 MED ORDER — PROPOFOL 10 MG/ML IV BOLUS
INTRAVENOUS | Status: DC | PRN
  Administered 2021-05-07: 150 mg via INTRAVENOUS

## 2021-05-07 MED ORDER — HYDROMORPHONE HCL 1 MG/ML IJ SOLN
INTRAMUSCULAR | Status: AC
  Filled 2021-05-07: qty 0.5

## 2021-05-07 MED ORDER — LIDOCAINE 2% (20 MG/ML) 5 ML SYRINGE
INTRAMUSCULAR | Status: DC | PRN
  Administered 2021-05-07: 100 mg via INTRAVENOUS

## 2021-05-07 MED ORDER — ONDANSETRON HCL 4 MG/2ML IJ SOLN
INTRAMUSCULAR | Status: DC | PRN
  Administered 2021-05-07: 4 mg via INTRAVENOUS

## 2021-05-07 MED ORDER — CEFAZOLIN SODIUM-DEXTROSE 2-4 GM/100ML-% IV SOLN
2.0000 g | INTRAVENOUS | Status: AC
  Administered 2021-05-07: 2 g via INTRAVENOUS

## 2021-05-07 SURGICAL SUPPLY — 86 items
BAG COUNTER SPONGE SURGICOUNT (BAG) ×3 IMPLANT
BANDAGE ESMARK 6X9 LF (GAUZE/BANDAGES/DRESSINGS) IMPLANT
BAR GLASS FIBER EXFX 11X150 (EXFIX) ×2 IMPLANT
BAR GLASS FIBER EXFX 11X400 (EXFIX) ×1 IMPLANT
BAR GLASS FIBER EXFX 11X500 (EXFIX) ×1 IMPLANT
BENZOIN TINCTURE PRP APPL 2/3 (GAUZE/BANDAGES/DRESSINGS) ×1 IMPLANT
BIT DRILL 3.2 XTRAFIX BLUE (BIT) ×1 IMPLANT
BIT DRILL CANN MED FLUTE 4.0 (BIT) IMPLANT
BLADE CLIPPER SURG (BLADE) ×3 IMPLANT
BLUNT HALF PIN 4MM ×2 IMPLANT
BLUNT HALF PIN 5MM ×2 IMPLANT
BNDG COHESIVE 4X5 TAN STRL (GAUZE/BANDAGES/DRESSINGS) ×2 IMPLANT
BNDG ELASTIC 4X5.8 VLCR STR LF (GAUZE/BANDAGES/DRESSINGS) ×4 IMPLANT
BNDG ELASTIC 6X5.8 VLCR STR LF (GAUZE/BANDAGES/DRESSINGS) ×3 IMPLANT
BNDG ESMARK 6X9 LF (GAUZE/BANDAGES/DRESSINGS)
BNDG GAUZE ELAST 4 BULKY (GAUZE/BANDAGES/DRESSINGS) ×5 IMPLANT
BRUSH SCRUB EZ PLAIN DRY (MISCELLANEOUS) ×6 IMPLANT
CAP PROTECTIVE TRANSFX 4.5X5MM (EXFIX) ×1 IMPLANT
CHLORAPREP W/TINT 26 (MISCELLANEOUS) ×3 IMPLANT
CLAMP BLUE BAR TO BAR (EXFIX) ×2 IMPLANT
CLAMP BLUE BAR TO PIN (EXFIX) ×4 IMPLANT
COVER SURGICAL LIGHT HANDLE (MISCELLANEOUS) ×6 IMPLANT
CUFF TOURN SGL QUICK 34 (TOURNIQUET CUFF)
CUFF TOURN SGL QUICK 42 (TOURNIQUET CUFF) IMPLANT
CUFF TRNQT CYL 34X4.125X (TOURNIQUET CUFF) ×2 IMPLANT
DECANTER SPIKE VIAL GLASS SM (MISCELLANEOUS) IMPLANT
DERMABOND ADVANCED (GAUZE/BANDAGES/DRESSINGS) ×1
DERMABOND ADVANCED .7 DNX12 (GAUZE/BANDAGES/DRESSINGS) ×2 IMPLANT
DRAPE C-ARM 42X72 X-RAY (DRAPES) ×3 IMPLANT
DRAPE C-ARMOR (DRAPES) ×3 IMPLANT
DRAPE HALF SHEET 40X57 (DRAPES) ×3 IMPLANT
DRAPE IMP U-DRAPE 54X76 (DRAPES) ×6 IMPLANT
DRAPE ORTHO SPLIT 77X108 STRL (DRAPES) ×2
DRAPE SURG ORHT 6 SPLT 77X108 (DRAPES) ×4 IMPLANT
DRAPE U-SHAPE 47X51 STRL (DRAPES) ×3 IMPLANT
DRILL CANN 4.0MM (BIT) ×3
DRSG MEPITEL 4X7.2 (GAUZE/BANDAGES/DRESSINGS) ×1 IMPLANT
ELECT REM PT RETURN 9FT ADLT (ELECTROSURGICAL) ×3
ELECTRODE REM PT RTRN 9FT ADLT (ELECTROSURGICAL) ×2 IMPLANT
GAUZE SPONGE 4X4 12PLY STRL (GAUZE/BANDAGES/DRESSINGS) ×3 IMPLANT
GAUZE SPONGE 4X4 12PLY STRL LF (GAUZE/BANDAGES/DRESSINGS) ×1 IMPLANT
GLOVE SURG ENC MOIS LTX SZ6.5 (GLOVE) ×9 IMPLANT
GLOVE SURG ENC MOIS LTX SZ7.5 (GLOVE) ×12 IMPLANT
GLOVE SURG UNDER POLY LF SZ6.5 (GLOVE) ×3 IMPLANT
GLOVE SURG UNDER POLY LF SZ7.5 (GLOVE) ×3 IMPLANT
GOWN STRL REUS W/ TWL LRG LVL3 (GOWN DISPOSABLE) ×4 IMPLANT
GOWN STRL REUS W/TWL LRG LVL3 (GOWN DISPOSABLE) ×2
IMMOBILIZER KNEE 22 UNIV (SOFTGOODS) ×2 IMPLANT
KIT BASIN OR (CUSTOM PROCEDURE TRAY) ×3 IMPLANT
KIT TURNOVER KIT B (KITS) ×3 IMPLANT
MANIFOLD NEPTUNE II (INSTRUMENTS) ×3 IMPLANT
NEEDLE 22X1 1/2 (OR ONLY) (NEEDLE) IMPLANT
NS IRRIG 1000ML POUR BTL (IV SOLUTION) ×3 IMPLANT
PACK GENERAL/GYN (CUSTOM PROCEDURE TRAY) ×3 IMPLANT
PACK ORTHO EXTREMITY (CUSTOM PROCEDURE TRAY) ×3 IMPLANT
PAD ARMBOARD 7.5X6 YLW CONV (MISCELLANEOUS) ×6 IMPLANT
PAD CAST 4YDX4 CTTN HI CHSV (CAST SUPPLIES) IMPLANT
PADDING CAST COTTON 4X4 STRL (CAST SUPPLIES) ×2
PADDING CAST COTTON 6X4 STRL (CAST SUPPLIES) ×5 IMPLANT
PIN 4X100X20MM EXFIX LG BLUNT (EXFIX) ×2 IMPLANT
PIN BLUNT XTRFIX LG 5X160X55MM (EXFIX) ×2 IMPLANT
PIN CLAMP 2BAR 75MM BLUE (EXFIX) ×1 IMPLANT
RETRIEVER SUT HEWSON (MISCELLANEOUS) ×1 IMPLANT
SPONGE T-LAP 18X18 ~~LOC~~+RFID (SPONGE) ×4 IMPLANT
STAPLER VISISTAT 35W (STAPLE) ×3 IMPLANT
STRIP CLOSURE SKIN 1/2X4 (GAUZE/BANDAGES/DRESSINGS) ×1 IMPLANT
SUT ETHILON 3 0 PS 1 (SUTURE) ×2 IMPLANT
SUT FIBERWIRE #2 38 T-5 BLUE (SUTURE)
SUT FIBERWIRE #5 38 CONV NDL (SUTURE) ×6
SUT MNCRL AB 3-0 PS2 18 (SUTURE) ×3 IMPLANT
SUT MNCRL+ AB 3-0 CT1 36 (SUTURE) IMPLANT
SUT MONOCRYL AB 3-0 CT1 36IN (SUTURE) ×1
SUT PROLENE 0 CT (SUTURE) IMPLANT
SUT VIC AB 0 CT1 27 (SUTURE) ×3
SUT VIC AB 0 CT1 27XBRD ANBCTR (SUTURE) ×2 IMPLANT
SUT VIC AB 1 CT1 27 (SUTURE)
SUT VIC AB 1 CT1 27XBRD ANBCTR (SUTURE) ×2 IMPLANT
SUT VIC AB 2-0 CT1 27 (SUTURE) ×2
SUT VIC AB 2-0 CT1 TAPERPNT 27 (SUTURE) ×2 IMPLANT
SUTURE FIBERWR #2 38 T-5 BLUE (SUTURE) IMPLANT
SUTURE FIBERWR #5 38 CONV NDL (SUTURE) IMPLANT
SYR CONTROL 10ML LL (SYRINGE) IMPLANT
TOWEL GREEN STERILE (TOWEL DISPOSABLE) ×6 IMPLANT
TOWEL GREEN STERILE FF (TOWEL DISPOSABLE) ×6 IMPLANT
UNDERPAD 30X36 HEAVY ABSORB (UNDERPADS AND DIAPERS) ×3 IMPLANT
WATER STERILE IRR 1000ML POUR (IV SOLUTION) ×3 IMPLANT

## 2021-05-07 NOTE — Anesthesia Procedure Notes (Signed)
Anesthesia Procedure Image    
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
## 2021-05-07 NOTE — Anesthesia Procedure Notes (Addendum)
Anesthesia Regional Block: Popliteal block   Pre-Anesthetic Checklist: , timeout performed,  Correct Patient, Correct Site, Correct Laterality,  Correct Procedure, Correct Position, site marked,  Risks and benefits discussed,  Surgical consent,  Pre-op evaluation,  At surgeon's request and post-op pain management  Laterality: Right  Prep: chloraprep       Needles:  Injection technique: Single-shot  Needle Type: Echogenic Needle     Needle Length: 9cm      Additional Needles:   Procedures:,,,, ultrasound used (permanent image in chart),,    Narrative:  Start time: 05/07/2021 3:07 PM End time: 05/07/2021 3:15 PM Injection made incrementally with aspirations every 5 mL.  Performed by: Personally  Anesthesiologist: Eilene Ghazi, MD  Additional Notes: Patient tolerated the procedure well without complications

## 2021-05-07 NOTE — ED Notes (Signed)
Consent signed and at bedside  
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
## 2021-05-07 NOTE — Interval H&P Note (Signed)
History and Physical Interval Note:  05/07/2021 2:45 PM  Gina Martin  has presented today for surgery, with the diagnosis of Pilon fracture.  The various methods of treatment have been discussed with the patient and family. After consideration of risks, benefits and other options for treatment, the patient has consented to  Procedure(s): EXTERNAL FIXATION AND IRRIGATION AND DEBRIDMENT  PILON (Right) OPEN REDUCTION INTERNAL (ORIF) FIXATION PATELLA (Left) as a surgical intervention.  The patient's history has been reviewed, patient examined, no change in status, stable for surgery.  I have reviewed the patient's chart and labs.  Questions were answered to the patient's satisfaction.     Caryn Bee P Richell Corker
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
## 2021-05-07 NOTE — Anesthesia Procedure Notes (Signed)
Procedure Name: Intubation Date/Time: 05/07/2021 3:39 PM Performed by: Maude Leriche, CRNA Pre-anesthesia Checklist: Patient identified, Emergency Drugs available, Suction available and Patient being monitored Patient Re-evaluated:Patient Re-evaluated prior to induction Oxygen Delivery Method: Circle system utilized Preoxygenation: Pre-oxygenation with 100% oxygen Induction Type: IV induction Laryngoscope Size: Glidescope and 3 Grade View: Grade I Tube type: Oral Tube size: 7.0 mm Number of attempts: 1 Airway Equipment and Method: Stylet, Video-laryngoscopy and Bite block Placement Confirmation: ETT inserted through vocal cords under direct vision, positive ETCO2 and breath sounds checked- equal and bilateral Secured at: 21 cm Tube secured with: Tape Dental Injury: Teeth and Oropharynx as per pre-operative assessment  Comments: Elective glidescope for RSI
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
## 2021-05-07 NOTE — ED Notes (Signed)
Patient left department on [facility] stretcher
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
## 2021-05-07 NOTE — Anesthesia Preprocedure Evaluation (Addendum)
Anesthesia Evaluation  Patient identified by MRN, date of birth, ID band Patient awake    Reviewed: Allergy & Precautions, NPO status , Patient's Chart, lab work & pertinent test results  Airway Mallampati: II  TM Distance: >3 FB Neck ROM: Full    Dental no notable dental hx.    Pulmonary asthma , Current Smoker and Patient abstained from smoking.,    Pulmonary exam normal breath sounds clear to auscultation       Cardiovascular hypertension, Pt. on medications Normal cardiovascular exam Rhythm:Regular Rate:Normal     Neuro/Psych Bipolar Disorder Schizophrenia negative neurological ROS     GI/Hepatic negative GI ROS, (+)     substance abuse  , BAC 0.114 upon arrival to ED   Endo/Other  negative endocrine ROS  Renal/GU negative Renal ROS  negative genitourinary   Musculoskeletal negative musculoskeletal ROS (+)   Abdominal   Peds negative pediatric ROS (+)  Hematology negative hematology ROS (+)   Anesthesia Other Findings   Reproductive/Obstetrics negative OB ROS                             Anesthesia Physical Anesthesia Plan  ASA: 3  Anesthesia Plan: General   Post-op Pain Management: Toradol IV (intra-op)   Induction: Intravenous and Rapid sequence  PONV Risk Score and Plan: 2 and Ondansetron, Dexamethasone and Treatment may vary due to age or medical condition  Airway Management Planned: Oral ETT  Additional Equipment:   Intra-op Plan:   Post-operative Plan: Extubation in OR  Informed Consent: I have reviewed the patients History and Physical, chart, labs and discussed the procedure including the risks, benefits and alternatives for the proposed anesthesia with the patient or authorized representative who has indicated his/her understanding and acceptance.     Dental advisory given  Plan Discussed with: CRNA and Surgeon  Anesthesia Plan Comments:         Anesthesia Quick Evaluation

## 2021-05-07 NOTE — ED Notes (Signed)
[facility] contacted and in route to transfer the patient to [facility] ED

## 2021-05-07 NOTE — Transfer of Care (Signed)
Immediate Anesthesia Transfer of Care Note  Patient: Gina Martin  Procedure(s) Performed: EXTERNAL FIXATION AND IRRIGATION AND DEBRIDMENT  PILON (Right) OPEN REDUCTION INTERNAL (ORIF) FIXATION PATELLA (Left: Knee)  Patient Location: PACU  Anesthesia Type:GA combined with regional for post-op pain  Level of Consciousness: awake, alert  and oriented  Airway & Oxygen Therapy: Patient Spontanous Breathing and Patient connected to face mask oxygen  Post-op Assessment: Report given to RN, Post -op Vital signs reviewed and stable and Patient able to stick tongue midline  Post vital signs: Reviewed  Last Vitals:  Vitals Value Taken Time  BP 153/109 05/07/21 1754  Temp 97.8   Pulse 101 05/07/21 1756  Resp 15 05/07/21 1756  SpO2 100 % 05/07/21 1756  Vitals shown include unvalidated device data.  Last Pain:  Vitals:   05/07/21 1400  TempSrc:   PainSc: 6       Patients Stated Pain Goal: 4 ([id number])  Complications: No notable events documented.
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
## 2021-05-07 NOTE — ED Notes (Signed)
Ortho tech paged  
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
## 2021-05-08 LAB — CBC
HCT: 33.2 % — ABNORMAL LOW (ref 36.0–46.0)
Hemoglobin: 10.6 g/dL — ABNORMAL LOW (ref 12.0–15.0)
MCH: 26 pg (ref 26.0–34.0)
MCHC: 31.9 g/dL (ref 30.0–36.0)
MCV: 81.6 fL (ref 80.0–100.0)
Platelets: 347 10*3/uL (ref 150–400)
RBC: 4.07 MIL/uL (ref 3.87–5.11)
RDW: 14.3 % (ref 11.5–15.5)
WBC: 15.4 10*3/uL — ABNORMAL HIGH (ref 4.0–10.5)
nRBC: 0 % (ref 0.0–0.2)

## 2021-05-08 LAB — BASIC METABOLIC PANEL
Anion gap: 9 (ref 5–15)
BUN: 10 mg/dL (ref 6–20)
CO2: 23 mmol/L (ref 22–32)
Calcium: 9.1 mg/dL (ref 8.9–10.3)
Chloride: 104 mmol/L (ref 98–111)
Creatinine, Ser: 0.87 mg/dL (ref 0.44–1.00)
GFR, Estimated: >60 mL/min (ref >60)
Glucose, Bld: 144 mg/dL — ABNORMAL HIGH (ref 70–99)
Potassium: 3.9 mmol/L (ref 3.5–5.1)
Sodium: 136 mmol/L (ref 135–145)

## 2021-05-08 LAB — VITAMIN D 25 HYDROXY (VIT D DEFICIENCY, FRACTURES): Vit D, 25-Hydroxy: 14.39 ng/mL — ABNORMAL LOW (ref 30–100)

## 2021-05-08 NOTE — Progress Notes (Signed)
Mobility Specialist Progress Note   05/08/21 1330  Mobility  Activity Transferred:  Chair to bed  Range of Motion/Exercises Right arm;Left arm  Level of Assistance +2 (takes two people)  Assistive Device  (HHA)  RLE Weight Bearing NWB  LLE Weight Bearing WBAT  Mobility Response Tolerated well  Mobility performed by Mobility specialist;Nurse tech  $Mobility charge 1 Mobility   Pt requesting a bath and to go back to bed. Min +2 Assist for AP transfer w/ physical assistance of LE and trunk to bed, pt did well with supporting self w/ UE to scoot forward. Requiring mod cues d/t attention deficit and family members interrupting progression. Pt had no complaint of pain throughout tx, left in bed w/ NT in room proceeding to give pt a bath.    Frederico Hamman Mobility Specialist Phone Number [phone]
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
## 2021-05-08 NOTE — Progress Notes (Signed)
Subjective: 1 Day Post-Op Procedure(s) (LRB): EXTERNAL FIXATION AND IRRIGATION AND DEBRIDMENT  PILON (Right) OPEN REDUCTION INTERNAL (ORIF) FIXATION PATELLA (Left) Patient reports pain as moderate.    Objective: Vital signs in last 24 hours: Temp:  [97.8 F (36.6 C)-99.1 F (37.3 C)] 98.4 F (36.9 C) (01/07 0753) Pulse Rate:  [79-104] 94 (01/07 0753) Resp:  [15-20] 17 (01/07 0753) BP: (129-157)/(64-104) 140/64 (01/07 0753) SpO2:  [95 %-100 %] 98 % (01/07 0753) Weight:  [116.6 kg] 116.6 kg (01/06 1353)  Intake/Output from previous day: 01/06 0701 - 01/07 0700 In: 2576.4 [P.O.:717; I.V.:1659.4; IV Piggyback:200] Out: 2160 [Urine:2000; Blood:160] Intake/Output this shift: No intake/output data recorded.  Recent Labs    05/07/21 0247 05/08/21 0135  HGB 12.5 10.6*   Recent Labs    05/07/21 0247 05/08/21 0135  WBC 10.1 15.4*  RBC 4.82 4.07  HCT 39.7 33.2*  PLT 423* 347   Recent Labs    05/07/21 0247 05/08/21 0135  NA 140 136  K 3.4* 3.9  CL 107 104  CO2 23 23  BUN 10 10  CREATININE 0.73 0.87  GLUCOSE 138* 144*  CALCIUM 9.2 9.1   No results for input(s): LABPT, INR in the last 72 hours.  Intact pulses distally Incision: moderate drainage and pin sites look good dsg changed around ankle with some bloody drainage from post wound.   Diminished sensation to light touch all dermatomes of the foot. Able to lift R leg during dressing change Left leg in knee immobilizer  Pt sitting up in chair with legs elevated onto the bed  Assessment/Plan: 1 Day Post-Op Procedure(s) (LRB): EXTERNAL FIXATION AND IRRIGATION AND DEBRIDMENT  PILON (Right) OPEN REDUCTION INTERNAL (ORIF) FIXATION PATELLA (Left) WBAT LLE in KI NWB RLE Plan for OR on Mon or Wed for definitive ORIF Pilon ankle fx Cont pain control as ordered Dsg change prn Lovenox dvt proph    [employer] 05/08/2021, 1:07 PM

## 2021-05-08 NOTE — Plan of Care (Signed)

## 2021-05-08 NOTE — Plan of Care (Signed)

## 2021-05-09 LAB — CBC
HCT: 29.3 % — ABNORMAL LOW (ref 36.0–46.0)
Hemoglobin: 9.3 g/dL — ABNORMAL LOW (ref 12.0–15.0)
MCH: 26 pg (ref 26.0–34.0)
MCHC: 31.7 g/dL (ref 30.0–36.0)
MCV: 81.8 fL (ref 80.0–100.0)
Platelets: 311 10*3/uL (ref 150–400)
RBC: 3.58 MIL/uL — ABNORMAL LOW (ref 3.87–5.11)
RDW: 14.1 % (ref 11.5–15.5)
WBC: 9.8 10*3/uL (ref 4.0–10.5)
nRBC: 0 % (ref 0.0–0.2)

## 2021-05-09 LAB — BASIC METABOLIC PANEL
Anion gap: 9 (ref 5–15)
BUN: 6 mg/dL (ref 6–20)
CO2: 24 mmol/L (ref 22–32)
Calcium: 8.6 mg/dL — ABNORMAL LOW (ref 8.9–10.3)
Chloride: 102 mmol/L (ref 98–111)
Creatinine, Ser: 0.74 mg/dL (ref 0.44–1.00)
GFR, Estimated: >60 mL/min (ref >60)
Glucose, Bld: 106 mg/dL — ABNORMAL HIGH (ref 70–99)
Potassium: 3.2 mmol/L — ABNORMAL LOW (ref 3.5–5.1)
Sodium: 135 mmol/L (ref 135–145)

## 2021-05-09 MED ORDER — DIPHENHYDRAMINE HCL 25 MG PO CAPS: 25.0000 mg | ORAL_CAPSULE | Freq: Four times a day (QID) | ORAL | Status: DC | PRN

## 2021-05-09 MED ORDER — VITAMIN D 25 MCG (1000 UNIT) PO TABS
1000.0000 [IU] | ORAL_TABLET | Freq: Every day | ORAL | Status: DC
  Administered 2021-05-09 – 2021-05-11 (×2): 1000 [IU] via ORAL
  Filled 2021-05-09 (×3): qty 1

## 2021-05-09 NOTE — Anesthesia Preprocedure Evaluation (Addendum)
Anesthesia Evaluation  Patient identified by MRN, date of birth, ID band Patient awake    Reviewed: Allergy & Precautions, NPO status , Patient's Chart, lab work & pertinent test results  Airway Mallampati: II  TM Distance: >3 FB Neck ROM: Full    Dental no notable dental hx. (+) Dental Advisory Given, Teeth Intact   Pulmonary asthma , Current Smoker and Patient abstained from smoking.,  1/2 ppd Uses rescue inhaler once per week   Pulmonary exam normal breath sounds clear to auscultation       Cardiovascular Normal cardiovascular exam Rhythm:Regular Rate:Normal     Neuro/Psych PSYCHIATRIC DISORDERS Bipolar Disorder Schizophrenia negative neurological ROS     GI/Hepatic negative GI ROS, Neg liver ROS,   Endo/Other  Obesity BMI 36  Renal/GU negative Renal ROS  negative genitourinary   Musculoskeletal R pilon fx   Abdominal (+) + obese,   Peds negative pediatric ROS (+)  Hematology  (+) Blood dyscrasia, anemia , Hb 9.3   Anesthesia Other Findings   Reproductive/Obstetrics (+) Pregnancy                            Anesthesia Physical Anesthesia Plan  ASA: 2  Anesthesia Plan: General and Regional   Post-op Pain Management: Regional block, Ofirmev IV (intra-op) and Toradol IV (intra-op)   Induction: Intravenous  PONV Risk Score and Plan: 2 and Ondansetron, Dexamethasone, Midazolam, Treatment may vary due to age or medical condition and Scopolamine patch - Pre-op  Airway Management Planned: LMA  Additional Equipment: None  Intra-op Plan:   Post-operative Plan: Extubation in OR  Informed Consent:   Plan Discussed with:   Anesthesia Plan Comments:         Anesthesia Quick Evaluation

## 2021-05-09 NOTE — Plan of Care (Signed)
°  Problem: Clinical Measurements: Goal: Will remain free from infection Outcome: Not Progressing   Problem: Activity: Goal: Risk for activity intolerance will decrease Outcome: Not Progressing   Problem: Coping: Goal: Level of anxiety will decrease Outcome: Not Progressing   Problem: Pain Managment: Goal: General experience of comfort will improve Outcome: Not Progressing   Problem: Skin Integrity: Goal: Risk for impaired skin integrity will decrease Outcome: Not Progressing

## 2021-05-09 NOTE — Progress Notes (Signed)
° ° °  Subjective: Patient reports pain as mild to moderate. Has to get her right foot in the proper position to be comfortable. Episodic numbness in right foot. Tolerating diet. Urinating. No CP, SOB.    Objective:   VITALS:   Vitals:   05/08/21 1642 05/08/21 2115 05/09/21 0304 05/09/21 0803  BP: 132/79 (!) 153/86 (!) 147/78 133/83  Pulse: 98 (!) 104 92 99  Resp: 17 18 18 19   Temp: 99.3 F (37.4 C) 99 F (37.2 C) 98.9 F (37.2 C) 98.6 F (37 C)  TempSrc: Oral Oral Oral Oral  SpO2:  100% 100% 100%  Weight:      Height:       CBC Latest Ref Rng & Units 05/09/2021 05/08/2021 05/07/2021  WBC 4.0 - 10.5 K/uL 9.8 15.4(H) 10.1  Hemoglobin 12.0 - 15.0 g/dL 9.3(L) 10.6(L) 12.5  Hematocrit 36.0 - 46.0 % 29.3(L) 33.2(L) 39.7  Platelets 150 - 400 K/uL 311 347 423(H)   BMP Latest Ref Rng & Units 05/09/2021 05/08/2021 05/07/2021  Glucose 70 - 99 mg/dL 106(H) 144(H) 138(H)  BUN 6 - 20 mg/dL 6 10 10   Creatinine 0.44 - 1.00 mg/dL 0.74 0.87 0.73  Sodium 135 - 145 mmol/L 135 136 140  Potassium 3.5 - 5.1 mmol/L 3.2(L) 3.9 3.4(L)  Chloride 98 - 111 mmol/L 102 104 107  CO2 22 - 32 mmol/L 24 23 23   Calcium 8.9 - 10.3 mg/dL 8.6(L) 9.1 9.2   Intake/Output      01/07 0701 01/08 0700 01/08 0701 01/09 0700   P.O. 480    I.V. (mL/kg)     IV Piggyback 100    Total Intake(mL/kg) 580 (5)    Urine (mL/kg/hr) 3000 (1.1) 1000 (1.4)   Blood     Total Output 3000 1000   Net -2420 -1000           Physical Exam: General: NAD.  Sitting up in bed, calm, comfortable Resp: No increased wob Cardio: regular rate and rhythm ABD soft Neurologically intact MSK Neurovascularly intact Sensation intact distally Intact pulses distally Dorsiflexion/Plantar flexion intact Incision: dressing C/D/I KI to left knee Ex fix to right ankle, pin sites c/d Swelling greatly decreased in RLE   Assessment: 2 Days Post-Op  S/P Procedure(s) (LRB): EXTERNAL FIXATION AND IRRIGATION AND DEBRIDMENT  PILON (Right) OPEN  REDUCTION INTERNAL (ORIF) FIXATION PATELLA (Left) by Dr. Doreatha Martin on 05/07/21  Principal Problem:   Open right ankle fracture   Plan: Appears ready for surgery on right ankle tomorrow  Non-op management of R tibial plateau fracture Complaining of irritation and itching from the KI to the left thigh. Ordered Benadryl Vit D 14.4, will need supplement Advance diet Up with therapy Incentive Spirometry Elevate and Apply ice  Weightbearing: WBAT LLE, NWB RLE Insicional and dressing care: Reinforce dressings as needed Orthopedic device(s):  Ex fix RLE, knee brace LLE Showering: Keep dressing dry VTE prophylaxis: Lovenox 40mg  qd  while inpatient , SCDs, ambulation Pain control: Tylenol, Dilaudid, Oxy PRN Follow - up plan:  with Dr. Suella Grove information for today:  Edmonia Lynch MD, Aggie Moats PA-C  Dispo:  Will return to OR either Monday or Wednesday for removal of Ex fix and ORIF of right ankle fracture.  NPO at midnight incase Dr. Doreatha Martin decides to operate in the morning.     Britt Bottom, PA-C Office [phone] 05/09/2021, 1:01 PM

## 2021-05-10 ENCOUNTER — Inpatient Hospital Stay (HOSPITAL_COMMUNITY): Payer: Medicaid Other

## 2021-05-10 ENCOUNTER — Inpatient Hospital Stay (HOSPITAL_COMMUNITY): Payer: Medicaid Other | Admitting: Anesthesiology

## 2021-05-10 ENCOUNTER — Encounter (HOSPITAL_COMMUNITY)
Admission: EM | Disposition: A | Discharge status: Home Health | Payer: Self-pay | Source: Home / Self Care | Attending: Student

## 2021-05-10 ENCOUNTER — Encounter (HOSPITAL_COMMUNITY): Payer: Self-pay | Admitting: Student

## 2021-05-10 DIAGNOSIS — S82121A Displaced fracture of lateral condyle of right tibia, initial encounter for closed fracture: Secondary | ICD-10-CM

## 2021-05-10 DIAGNOSIS — S82002A Unspecified fracture of left patella, initial encounter for closed fracture: Secondary | ICD-10-CM

## 2021-05-10 HISTORY — PX: EXTERNAL FIXATION REMOVAL: SHX5040

## 2021-05-10 HISTORY — PX: OPEN REDUCTION INTERNAL FIXATION (ORIF) TIBIA/FIBULA FRACTURE: SHX5992

## 2021-05-10 LAB — BASIC METABOLIC PANEL
Anion gap: 9 (ref 5–15)
BUN: <5 mg/dL — ABNORMAL LOW (ref 6–20)
CO2: 26 mmol/L (ref 22–32)
Calcium: 8.8 mg/dL — ABNORMAL LOW (ref 8.9–10.3)
Chloride: 99 mmol/L (ref 98–111)
Creatinine, Ser: 0.63 mg/dL (ref 0.44–1.00)
GFR, Estimated: >60 mL/min (ref >60)
Glucose, Bld: 103 mg/dL — ABNORMAL HIGH (ref 70–99)
Potassium: 3.4 mmol/L — ABNORMAL LOW (ref 3.5–5.1)
Sodium: 134 mmol/L — ABNORMAL LOW (ref 135–145)

## 2021-05-10 SURGERY — OPEN REDUCTION INTERNAL FIXATION (ORIF) TIBIA/FIBULA FRACTURE
Anesthesia: Regional | Laterality: Right

## 2021-05-10 MED ORDER — PROMETHAZINE HCL 25 MG/ML IJ SOLN: 6.2500 mg | INTRAMUSCULAR | Status: DC | PRN

## 2021-05-10 MED ORDER — KETOROLAC TROMETHAMINE 30 MG/ML IJ SOLN: 30.0000 mg | Freq: Once | INTRAMUSCULAR | Status: DC | PRN

## 2021-05-10 MED ORDER — DEXAMETHASONE SODIUM PHOSPHATE 10 MG/ML IJ SOLN
INTRAMUSCULAR | Status: DC | PRN
  Administered 2021-05-10: 10 mg via INTRAVENOUS

## 2021-05-10 MED ORDER — FENTANYL CITRATE (PF) 100 MCG/2ML IJ SOLN
INTRAMUSCULAR | Status: DC | PRN
Start: 2021-05-10 — End: 2021-05-10
  Administered 2021-05-10 (×2): 50 ug via INTRAVENOUS
  Administered 2021-05-10: 100 ug via INTRAVENOUS

## 2021-05-10 MED ORDER — MIDAZOLAM HCL 5 MG/5ML IJ SOLN
INTRAMUSCULAR | Status: DC | PRN
  Administered 2021-05-10 (×2): 1 mg via INTRAVENOUS

## 2021-05-10 MED ORDER — MIDAZOLAM HCL 2 MG/2ML IJ SOLN
INTRAMUSCULAR | Status: AC
  Filled 2021-05-10: qty 2

## 2021-05-10 MED ORDER — VANCOMYCIN HCL 1000 MG IV SOLR
INTRAVENOUS | Status: DC | PRN
  Administered 2021-05-10: 1000 mg via TOPICAL

## 2021-05-10 MED ORDER — MEPERIDINE HCL 25 MG/ML IJ SOLN: 6.2500 mg | INTRAMUSCULAR | Status: DC | PRN

## 2021-05-10 MED ORDER — HYDROMORPHONE HCL 1 MG/ML IJ SOLN: 0.2500 mg | INTRAMUSCULAR | Status: DC | PRN

## 2021-05-10 MED ORDER — LIDOCAINE 2% (20 MG/ML) 5 ML SYRINGE
INTRAMUSCULAR | Status: AC
  Filled 2021-05-10: qty 5

## 2021-05-10 MED ORDER — PROPOFOL 10 MG/ML IV BOLUS
INTRAVENOUS | Status: DC | PRN
  Administered 2021-05-10: 200 mg via INTRAVENOUS

## 2021-05-10 MED ORDER — PROPOFOL 10 MG/ML IV BOLUS
INTRAVENOUS | Status: AC
  Filled 2021-05-10: qty 20

## 2021-05-10 MED ORDER — ROPIVACAINE HCL 5 MG/ML IJ SOLN
INTRAMUSCULAR | Status: DC | PRN
  Administered 2021-05-10: 40 mL via PERINEURAL

## 2021-05-10 MED ORDER — LACTATED RINGERS IV SOLN: INTRAVENOUS | Status: DC | PRN

## 2021-05-10 MED ORDER — CEFAZOLIN SODIUM-DEXTROSE 2-4 GM/100ML-% IV SOLN
2.0000 g | Freq: Three times a day (TID) | INTRAVENOUS | Status: DC
  Administered 2021-05-10 (×2): 2 g via INTRAVENOUS
  Filled 2021-05-10 (×3): qty 100

## 2021-05-10 MED ORDER — CEFAZOLIN SODIUM-DEXTROSE 2-3 GM-%(50ML) IV SOLR
INTRAVENOUS | Status: DC | PRN
  Administered 2021-05-10: 2 g via INTRAVENOUS

## 2021-05-10 MED ORDER — DEXAMETHASONE SODIUM PHOSPHATE 10 MG/ML IJ SOLN
INTRAMUSCULAR | Status: AC
  Filled 2021-05-10: qty 1

## 2021-05-10 MED ORDER — ONDANSETRON HCL 4 MG/2ML IJ SOLN
INTRAMUSCULAR | Status: DC | PRN
Start: 2021-05-10 — End: 2021-05-10
  Administered 2021-05-10: 4 mg via INTRAVENOUS

## 2021-05-10 MED ORDER — LIDOCAINE 2% (20 MG/ML) 5 ML SYRINGE
INTRAMUSCULAR | Status: DC | PRN
Start: 2021-05-10 — End: 2021-05-10
  Administered 2021-05-10: 20 mg via INTRAVENOUS

## 2021-05-10 MED ORDER — OXYCODONE HCL 5 MG/5ML PO SOLN: 5.0000 mg | Freq: Once | ORAL | Status: DC | PRN

## 2021-05-10 MED ORDER — GABAPENTIN 100 MG PO CAPS
100.0000 mg | ORAL_CAPSULE | Freq: Three times a day (TID) | ORAL | Status: DC
  Administered 2021-05-10 – 2021-05-11 (×3): 100 mg via ORAL
  Filled 2021-05-10 (×3): qty 1

## 2021-05-10 MED ORDER — 0.9 % SODIUM CHLORIDE (POUR BTL) OPTIME
TOPICAL | Status: DC | PRN
  Administered 2021-05-10: 1000 mL

## 2021-05-10 MED ORDER — DEXAMETHASONE SODIUM PHOSPHATE 10 MG/ML IJ SOLN
INTRAMUSCULAR | Status: DC | PRN
  Administered 2021-05-10: 5 mg via INTRAVENOUS

## 2021-05-10 MED ORDER — OXYCODONE HCL 5 MG PO TABS: 5.0000 mg | ORAL_TABLET | Freq: Once | ORAL | Status: DC | PRN

## 2021-05-10 MED ORDER — ONDANSETRON HCL 4 MG/2ML IJ SOLN
INTRAMUSCULAR | Status: AC
  Filled 2021-05-10: qty 2

## 2021-05-10 MED ORDER — FENTANYL CITRATE (PF) 250 MCG/5ML IJ SOLN
INTRAMUSCULAR | Status: AC
  Filled 2021-05-10: qty 5

## 2021-05-10 MED ORDER — VANCOMYCIN HCL 1000 MG IV SOLR
INTRAVENOUS | Status: AC
  Filled 2021-05-10: qty 20

## 2021-05-10 SURGICAL SUPPLY — 82 items
BAG COUNTER SPONGE SURGICOUNT (BAG) ×2 IMPLANT
BANDAGE ESMARK 6X9 LF (GAUZE/BANDAGES/DRESSINGS) ×1 IMPLANT
BIT DRILL CALIBRATED 2.7 (BIT) ×1 IMPLANT
BNDG COHESIVE 4X5 TAN STRL (GAUZE/BANDAGES/DRESSINGS) ×2 IMPLANT
BNDG ELASTIC 4X5.8 VLCR STR LF (GAUZE/BANDAGES/DRESSINGS) ×2 IMPLANT
BNDG ELASTIC 6X5.8 VLCR STR LF (GAUZE/BANDAGES/DRESSINGS) ×2 IMPLANT
BNDG ESMARK 6X9 LF (GAUZE/BANDAGES/DRESSINGS)
BNDG GAUZE ELAST 4 BULKY (GAUZE/BANDAGES/DRESSINGS) ×2 IMPLANT
BRUSH SCRUB EZ PLAIN DRY (MISCELLANEOUS) ×4 IMPLANT
CHLORAPREP W/TINT 26 (MISCELLANEOUS) ×2 IMPLANT
COVER SURGICAL LIGHT HANDLE (MISCELLANEOUS) ×4 IMPLANT
DRAPE C-ARM 42X72 X-RAY (DRAPES) ×2 IMPLANT
DRAPE C-ARMOR (DRAPES) ×2 IMPLANT
DRAPE ORTHO SPLIT 77X108 STRL (DRAPES) ×2
DRAPE SURG ORHT 6 SPLT 77X108 (DRAPES) ×2 IMPLANT
DRAPE U-SHAPE 47X51 STRL (DRAPES) ×2 IMPLANT
DRILL SLEEVE 2.7 DIST TIB (TRAUMA) ×1
DRSG ADAPTIC 3X8 NADH LF (GAUZE/BANDAGES/DRESSINGS) ×1 IMPLANT
DRSG MEPITEL 4X7.2 (GAUZE/BANDAGES/DRESSINGS) ×1 IMPLANT
DRSG PAD ABDOMINAL 8X10 ST (GAUZE/BANDAGES/DRESSINGS) ×2 IMPLANT
ELECT REM PT RETURN 9FT ADLT (ELECTROSURGICAL) ×2
ELECTRODE REM PT RTRN 9FT ADLT (ELECTROSURGICAL) ×1 IMPLANT
GAUZE SPONGE 4X4 12PLY STRL (GAUZE/BANDAGES/DRESSINGS) ×1 IMPLANT
GAUZE SPONGE 4X4 12PLY STRL LF (GAUZE/BANDAGES/DRESSINGS) ×2 IMPLANT
GLOVE SURG ENC MOIS LTX SZ6.5 (GLOVE) ×6 IMPLANT
GLOVE SURG ENC MOIS LTX SZ7.5 (GLOVE) ×8 IMPLANT
GLOVE SURG UNDER POLY LF SZ6.5 (GLOVE) ×2 IMPLANT
GLOVE SURG UNDER POLY LF SZ7.5 (GLOVE) ×2 IMPLANT
GOWN STRL REUS W/ TWL LRG LVL3 (GOWN DISPOSABLE) ×2 IMPLANT
GOWN STRL REUS W/TWL LRG LVL3 (GOWN DISPOSABLE) ×2
K-WIRE ACE 1.6X6 (WIRE) ×12
KIT BASIN OR (CUSTOM PROCEDURE TRAY) ×2 IMPLANT
KIT TURNOVER KIT B (KITS) ×2 IMPLANT
KWIRE ACE 1.6X6 (WIRE) IMPLANT
MANIFOLD NEPTUNE II (INSTRUMENTS) ×2 IMPLANT
NDL HYPO 21X1.5 SAFETY (NEEDLE) IMPLANT
NDL HYPO 25GX1X1/2 BEV (NEEDLE) ×1 IMPLANT
NEEDLE HYPO 21X1.5 SAFETY (NEEDLE) IMPLANT
NEEDLE HYPO 25GX1X1/2 BEV (NEEDLE) IMPLANT
NS IRRIG 1000ML POUR BTL (IV SOLUTION) ×2 IMPLANT
PACK TOTAL JOINT (CUSTOM PROCEDURE TRAY) ×2 IMPLANT
PAD ARMBOARD 7.5X6 YLW CONV (MISCELLANEOUS) ×4 IMPLANT
PAD CAST 4YDX4 CTTN HI CHSV (CAST SUPPLIES) IMPLANT
PADDING CAST COTTON 4X4 STRL (CAST SUPPLIES) ×2
PADDING CAST COTTON 6X4 STRL (CAST SUPPLIES) ×5 IMPLANT
PLATE 12H RT DIST ANTLAT TIB (Plate) ×1 IMPLANT
PLATE LOCK 4H 109 RT DIST FIB (Plate) ×1 IMPLANT
SCREW CORT LP 3.5X14 (Screw) ×3 IMPLANT
SCREW CORTICAL 3.5MM  28MM (Screw) ×2 IMPLANT
SCREW CORTICAL 3.5MM 26MM (Screw) ×2 IMPLANT
SCREW CORTICAL 3.5MM 28MM (Screw) IMPLANT
SCREW LOCK CORT STAR 3.5X16 (Screw) ×2 IMPLANT
SCREW LOCK CORT STAR 3.5X18 (Screw) ×1 IMPLANT
SCREW LOCK CORT STAR 3.5X38 (Screw) ×1 IMPLANT
SCREW LOCK CORT STAR 3.5X40 (Screw) ×1 IMPLANT
SCREW LOCK CORT STAR 3.5X42 (Screw) ×1 IMPLANT
SCREW LOCK CORT STAR 3.5X44 (Screw) ×2 IMPLANT
SCREW LP NL T15 3.5X24 (Screw) ×1 IMPLANT
SCREW T15 LP CORT 3.5X50MM NS (Screw) ×1 IMPLANT
SLEEVE DRILL 2.7 DIST TIB (TRAUMA) IMPLANT
SPONGE T-LAP 18X18 ~~LOC~~+RFID (SPONGE) ×2 IMPLANT
STAPLER VISISTAT 35W (STAPLE) ×1 IMPLANT
SUCTION FRAZIER HANDLE 10FR (MISCELLANEOUS) ×2
SUCTION TUBE FRAZIER 10FR DISP (MISCELLANEOUS) ×1 IMPLANT
SUT ETHILON 2 0 FS 18 (SUTURE) ×3 IMPLANT
SUT ETHILON 3 0 PS 1 (SUTURE) ×4 IMPLANT
SUT MNCRL AB 3-0 PS2 18 (SUTURE) ×1 IMPLANT
SUT MON AB 2-0 CT1 36 (SUTURE) ×1 IMPLANT
SUT PROLENE 0 CT (SUTURE) IMPLANT
SUT VIC AB 0 CT1 18XCR BRD 8 (SUTURE) IMPLANT
SUT VIC AB 0 CT1 27 (SUTURE)
SUT VIC AB 0 CT1 27XBRD ANBCTR (SUTURE) ×1 IMPLANT
SUT VIC AB 0 CT1 8-18 (SUTURE) ×1
SUT VIC AB 2-0 CT1 (SUTURE) ×1 IMPLANT
SUT VIC AB 2-0 CT1 18 (SUTURE) ×1 IMPLANT
SUT VIC AB 2-0 CT1 27 (SUTURE)
SUT VIC AB 2-0 CT1 TAPERPNT 27 (SUTURE) ×2 IMPLANT
SYR CONTROL 10ML LL (SYRINGE) ×2 IMPLANT
TOWEL GREEN STERILE (TOWEL DISPOSABLE) ×4 IMPLANT
TOWEL GREEN STERILE FF (TOWEL DISPOSABLE) ×4 IMPLANT
UNDERPAD 30X36 HEAVY ABSORB (UNDERPADS AND DIAPERS) ×2 IMPLANT
WATER STERILE IRR 1000ML POUR (IV SOLUTION) ×4 IMPLANT

## 2021-05-10 NOTE — Progress Notes (Signed)
PT Cancellation Note  Patient Details Name: Gina Martin MRN: [id number] DOB: [birth year]   Cancelled Treatment:    Reason Eval/Treat Not Completed: Patient at procedure or test/unavailable. Patient in surgery this morning. Will re-assess when available and appropriate.    Emerita Berkemeier 05/10/2021, 9:48 AM

## 2021-05-10 NOTE — Care Management (Signed)
°  °  Durable Medical Equipment  (From admission, onward)           Start     Ordered   05/10/21 1333  For home use only DME Walker rolling  Once       Question Answer Comment  Walker: With 5 Inch Wheels   Patient needs a walker to treat with the following condition Weakness      05/10/21 1335   05/10/21 1332  For home use only DME 3 n 1  Once       Comments: Tub/shower bench (Bariatric drop arm BSC).   05/10/21 1335   05/10/21 1328  For home use only DME standard manual wheelchair with seat cushion  Once       Comments: Patient suffers from Left closed patella fracture Right type IIIA open pilon fracture Right lateral tibial plateau fracture which impairs their ability to perform daily activities like ambulating  in the home.  A cane  will not resolve issue with performing activities of daily living. A wheelchair will allow patient to safely perform daily activities. Patient can safely propel the wheelchair in the home or has a caregiver who can provide assistance. Length of need  3 months . Accessories: elevating leg rests (ELRs), wheel locks, extensions and anti-tippers.  Seat and back cushions  Wheelchair (20"wide by 18");Wheelchair cushion (same with 2" deep gel/foam cushion.) removable arm rests and elevating leg rests;  Ht 5'5" wt 116.6 kg   05/10/21 1335

## 2021-05-10 NOTE — Anesthesia Procedure Notes (Signed)
Anesthesia Regional Block: Popliteal block   Pre-Anesthetic Checklist: , timeout performed,  Correct Patient, Correct Site, Correct Laterality,  Correct Procedure, Correct Position, site marked,  Risks and benefits discussed,  Surgical consent,  Pre-op evaluation,  At surgeon's request and post-op pain management  Laterality: Right  Prep: Maximum Sterile Barrier Precautions used, chloraprep       Needles:  Injection technique: Single-shot  Needle Type: Echogenic Stimulator Needle     Needle Length: 9cm  Needle Gauge: 22     Additional Needles:   Procedures:,,,, ultrasound used (permanent image in chart),,    Narrative:  Start time: 05/10/2021 7:20 AM End time: 05/10/2021 7:25 AM Injection made incrementally with aspirations every 5 mL.  Performed by: Personally  Anesthesiologist: Pervis Hocking, DO  Additional Notes: Monitors applied. No increased pain on injection. No increased resistance to injection. Injection made in 5cc increments. Good needle visualization. Patient tolerated procedure well.

## 2021-05-10 NOTE — Transfer of Care (Signed)
Immediate Anesthesia Transfer of Care Note  Patient: Gina Martin  Procedure(s) Performed: OPEN REDUCTION INTERNAL FIXATION (ORIF) TIBIA/FIBULA FRACTURE (Right) REMOVAL EXTERNAL FIXATION LEG (Right)  Patient Location: PACU  Anesthesia Type:GA combined with regional for post-op pain  Level of Consciousness: drowsy and patient cooperative  Airway & Oxygen Therapy: Patient Spontanous Breathing and Patient connected to nasal cannula oxygen  Post-op Assessment: Report given to RN, Post -op Vital signs reviewed and stable and Patient moving all extremities  Post vital signs: Reviewed and stable  Last Vitals:  Vitals Value Taken Time  BP 143/71 05/10/21 1039  Temp    Pulse 92 05/10/21 1039  Resp 16 05/10/21 1039  SpO2 100 % 05/10/21 1039  Vitals shown include unvalidated device data.  Last Pain:  Vitals:   05/10/21 0443  TempSrc:   PainSc: 0-No pain      Patients Stated Pain Goal: 1 (05/09/21 2201)  Complications: No notable events documented.

## 2021-05-10 NOTE — Progress Notes (Signed)
Ortho Trauma Note  Swelling is present but appropriate for surgical incision. Risks and benefits discussed with the patient. Risks discussed included bleeding requiring blood transfusion, bleeding causing a hematoma, infection, malunion, nonunion, damage to surrounding nerves and blood vessels, pain, hardware prominence or irritation, hardware failure, wound healing problems, stiffness, post-traumatic arthritis, DVT/PE, and even anesthetic complications. She agrees to proceed with surgery and consent was obtained.  Shona Needles, MD [facility] [phone] (office) [email or web address]

## 2021-05-10 NOTE — TOC CAGE-AID Note (Signed)
Transition of Care [facility]) - CAGE-AID Screening   Patient Details  Name: Gina Martin MRN: [id number] Date of Birth: [birth year]  [facility] CM/SW Contact:    Mekhi Sonn C Tarpley-Carter, LCSWA Phone Number: 05/10/2021, 10:42 AM   Clinical Narrative: Pt is unable to participate in Cage Aid.  CSW will assess at a better time.  Alexias Margerum Tarpley-Carter, MSW, LCSW-A Pronouns:  She/Her/Hers [place] Transitions of Care Clinical Social Worker Direct Number:  [phone] [email or web address]  CAGE-AID Screening: Substance Abuse Screening unable to be completed due to: : Patient unable to participate             Substance Abuse Education Offered: No

## 2021-05-10 NOTE — Plan of Care (Signed)
°  Problem: Elimination: Goal: Will not experience complications related to bowel motility Outcome: Not Progressing   Problem: Pain Managment: Goal: General experience of comfort will improve Outcome: Not Progressing   Problem: Skin Integrity: Goal: Risk for impaired skin integrity will decrease Outcome: Not Progressing   Problem: Safety: Goal: Ability to remain free from injury will improve Outcome: Not Progressing   

## 2021-05-10 NOTE — Anesthesia Procedure Notes (Signed)
Anesthesia Regional Block: Adductor canal block   Pre-Anesthetic Checklist: , timeout performed,  Correct Patient, Correct Site, Correct Laterality,  Correct Procedure, Correct Position, site marked,  Risks and benefits discussed,  Surgical consent,  Pre-op evaluation,  At surgeon's request and post-op pain management  Laterality: Right  Prep: Maximum Sterile Barrier Precautions used, chloraprep       Needles:  Injection technique: Single-shot  Needle Type: Echogenic Stimulator Needle     Needle Length: 9cm  Needle Gauge: 22     Additional Needles:   Procedures:,,,, ultrasound used (permanent image in chart),,    Narrative:  Start time: 05/10/2021 7:25 AM End time: 05/10/2021 7:30 AM Injection made incrementally with aspirations every 5 mL.  Performed by: Personally  Anesthesiologist: Lannie Fields, DO  Additional Notes: Monitors applied. No increased pain on injection. No increased resistance to injection. Injection made in 5cc increments. Good needle visualization. Patient tolerated procedure well.

## 2021-05-10 NOTE — Progress Notes (Signed)
Attempted handoff to Tawni Levy several times but cannot connect. Left message through [employer]era.

## 2021-05-10 NOTE — Progress Notes (Signed)
OT Cancellation Note  Patient Details Name: Gina Martin MRN: [id number] DOB: Sep 02, 1990   Cancelled Treatment:    Reason Eval/Treat Not Completed: Fatigue/lethargy limiting ability to participate Patient falling asleep with OT multiple times, suspect due to coming off of anesthesia from surgery this AM. Therapy will follow back once more alert and participatory.   Corinne Ports E. Nilan Iddings, COTA/L [facility] [place] 05/10/2021, 2:31 PM

## 2021-05-10 NOTE — TOC Progression Note (Addendum)
Transition of Care [facility]) - Progression Note    Patient Details  Name: Gina Martin MRN: [id number] Date of Birth: 08-Dec-1990  [facility]) CM/SW Contact  Nadene Rubins Adria Devon, RN Phone Number: 05/10/2021, 12:44 PM  Clinical Narrative:     NCM spoke to patient and two visitors at bedside.   NCM confirmed face sheet information. Patient states she will have help at home, visitors confirm.   NCM discussed HHPT , usually twice a week for about a hour at a time. Discussed wheel chair, 3 in1 and walker. All voiced understanding.   NCM will start process for home health, and provide updates when available   Pearson Grippe with [facility] unable to accept referral .   Elnita Maxwell with [facility] unable to accept referral.  [facility] unable to accept referral.   Okey Regal with [facility] unable to accept referral.   Colin Mulders with [facility] unable to accept referral.    Eber Jones with [facility] uble to accept referral.   Amy with [facility] unable to accept referral.   [facility] unable to accept referral.   Meriam Sprague at [facility] only provides hospice services and not home health      Kandee Keen with [facility] cannot accept referral  Marcell Anger with [facility] cannot accept referral.   Stacie with [facility] cannot accept referral.    Marylene Land with [facility] cannot accept referral.     Spoke to Maralyn Sago PA. Therapy will be arranged when patient is allowed more mobility ( follow up appointment). Patient aware.   DME ordered with Velna Hatchet with [employer] [facility]   Tub/shower bench (Bariatric drop arm BSC).  Wheelchair (20"wide by 18");Wheelchair cushion (same with 2" deep gel/foam cushion.) removable arm rests and elevating leg rests  Walker   .  Expected Discharge Plan: Home w Home Health Services Barriers to Discharge: Continued Medical Work up  Expected Discharge Plan and Services Expected Discharge Plan: Home w Home Health Services   Discharge  Planning Services: CM Consult Post Acute Care Choice: Home Health, Durable Medical Equipment Living arrangements for the past 2 months: Single Family Home                 DME Arranged: 3-N-1, Walker rolling, Wheelchair manual DME Agency: [facility]       HH Arranged: PT           Social Determinants of Health (SDOH) Interventions    Readmission Risk Interventions No flowsheet data found.

## 2021-05-10 NOTE — Interval H&P Note (Signed)
History and Physical Interval Note:  05/10/2021 7:22 AM  Gina Martin  has presented today for surgery, with the diagnosis of Right pilon fracture.  The various methods of treatment have been discussed with the patient and family. After consideration of risks, benefits and other options for treatment, the patient has consented to  Procedure(s): OPEN REDUCTION INTERNAL FIXATION (ORIF) TIBIA/FIBULA FRACTURE (Right) REMOVAL EXTERNAL FIXATION LEG (Right) as a surgical intervention.  The patient's history has been reviewed, patient examined, no change in status, stable for surgery.  I have reviewed the patient's chart and labs.  Questions were answered to the patient's satisfaction.     Caryn Bee P Andreika Vandagriff

## 2021-05-10 NOTE — Anesthesia Postprocedure Evaluation (Signed)
Anesthesia Post Note  Patient: Gina Martin  Procedure(s) Performed: OPEN REDUCTION INTERNAL FIXATION (ORIF) TIBIA/FIBULA FRACTURE (Right) REMOVAL EXTERNAL FIXATION LEG (Right)     Patient location during evaluation: PACU Anesthesia Type: Regional and General Level of consciousness: awake and alert, oriented and patient cooperative Pain management: pain level controlled Vital Signs Assessment: post-procedure vital signs reviewed and stable Respiratory status: spontaneous breathing, nonlabored ventilation and respiratory function stable Cardiovascular status: blood pressure returned to baseline and stable Postop Assessment: no apparent nausea or vomiting Anesthetic complications: no   No notable events documented.  Last Vitals:  Vitals:   05/10/21 1110 05/10/21 1126  BP: 113/66 128/73  Pulse: 95 91  Resp: 15 15  Temp: 36.9 C 36.6 C  SpO2: 100% 100%    Last Pain:  Vitals:   05/10/21 1202  TempSrc:   PainSc: 0-No pain                 Lannie Fields

## 2021-05-10 NOTE — Anesthesia Procedure Notes (Signed)
Procedure Name: LMA Insertion Date/Time: 05/10/2021 7:42 AM Performed by: Shireen Quan, CRNA Pre-anesthesia Checklist: Patient identified, Emergency Drugs available, Suction available and Patient being monitored Patient Re-evaluated:Patient Re-evaluated prior to induction Oxygen Delivery Method: Circle System Utilized Preoxygenation: Pre-oxygenation with 100% oxygen Induction Type: IV induction Ventilation: Mask ventilation without difficulty LMA: LMA inserted LMA Size: 5.0 Number of attempts: 1 Placement Confirmation: positive ETCO2 Tube secured with: Tape Dental Injury: Teeth and Oropharynx as per pre-operative assessment
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
## 2021-05-11 ENCOUNTER — Encounter (HOSPITAL_COMMUNITY): Payer: Self-pay | Admitting: Student

## 2021-05-11 ENCOUNTER — Other Ambulatory Visit (HOSPITAL_COMMUNITY): Payer: Self-pay

## 2021-05-11 LAB — BASIC METABOLIC PANEL
Anion gap: 7 (ref 5–15)
BUN: 8 mg/dL (ref 6–20)
CO2: 26 mmol/L (ref 22–32)
Calcium: 8.8 mg/dL — ABNORMAL LOW (ref 8.9–10.3)
Chloride: 102 mmol/L (ref 98–111)
Creatinine, Ser: 0.67 mg/dL (ref 0.44–1.00)
GFR, Estimated: >60 mL/min (ref >60)
Glucose, Bld: 117 mg/dL — ABNORMAL HIGH (ref 70–99)
Potassium: 3.1 mmol/L — ABNORMAL LOW (ref 3.5–5.1)
Sodium: 135 mmol/L (ref 135–145)

## 2021-05-11 LAB — CBC
HCT: 28.3 % — ABNORMAL LOW (ref 36.0–46.0)
Hemoglobin: 9.1 g/dL — ABNORMAL LOW (ref 12.0–15.0)
MCH: 26.2 pg (ref 26.0–34.0)
MCHC: 32.2 g/dL (ref 30.0–36.0)
MCV: 81.6 fL (ref 80.0–100.0)
Platelets: 382 10*3/uL (ref 150–400)
RBC: 3.47 MIL/uL — ABNORMAL LOW (ref 3.87–5.11)
RDW: 13.5 % (ref 11.5–15.5)
WBC: 15.1 10*3/uL — ABNORMAL HIGH (ref 4.0–10.5)
nRBC: 0 % (ref 0.0–0.2)

## 2021-05-11 MED ORDER — ACETAMINOPHEN 500 MG PO TABS: 1000.0000 mg | ORAL_TABLET | Freq: Three times a day (TID) | ORAL | Status: DC | PRN

## 2021-05-11 MED ORDER — GABAPENTIN 100 MG PO CAPS
100.0000 mg | ORAL_CAPSULE | Freq: Three times a day (TID) | ORAL | 0 refills | Status: DC
  Filled 2021-05-11: qty 21, 7d supply, fill #0

## 2021-05-11 MED ORDER — METHOCARBAMOL 500 MG PO TABS
500.0000 mg | ORAL_TABLET | Freq: Four times a day (QID) | ORAL | 0 refills | Status: DC | PRN
  Filled 2021-05-11: qty 28, 7d supply, fill #0

## 2021-05-11 MED ORDER — APIXABAN 2.5 MG PO TABS
2.5000 mg | ORAL_TABLET | Freq: Two times a day (BID) | ORAL | 0 refills | Status: DC
  Filled 2021-05-11: qty 60, 30d supply, fill #0

## 2021-05-11 MED ORDER — OXYCODONE HCL 10 MG PO TABS
5.0000 mg | ORAL_TABLET | ORAL | 0 refills | Status: DC | PRN
Start: 2021-05-11 — End: 2021-12-09
  Filled 2021-05-11: qty 42, 7d supply, fill #0

## 2021-05-11 MED ORDER — VITAMIN D3 25 MCG PO TABS
1000.0000 [IU] | ORAL_TABLET | Freq: Every day | ORAL | 0 refills | Status: AC
  Filled 2021-05-11: qty 30, 30d supply, fill #0

## 2021-05-11 NOTE — TOC CAGE-AID Note (Signed)
Transition of Care [facility]) - CAGE-AID Screening   Patient Details  Name: Trinette Vera MRN: [id number] Date of Birth: Oct 11, 1990  Transition of Care [facility]) CM/SW Contact:    Cena Bruhn C Tarpley-Carter, LCSWA Phone Number: 05/11/2021, 12:28 PM   Clinical Narrative: Pt participated in Cage-Aid.  Pt stated she does not use substance or ETOH.  Pt drinks socially. Pt was offered resources, due to usage of ETOH.    Daivik Overley Tarpley-Carter, MSW, LCSW-A Pronouns:  She/Her/Hers [facility] Transitions of Care Clinical Social Worker Direct Number:  [phone] [email or web address]   CAGE-AID Screening: Substance Abuse Screening unable to be completed due to: : Patient unable to participate  Have You Ever Felt You Ought to Cut Down on Your Drinking or Drug Use?: No Have People Annoyed You By Critizing Your Drinking Or Drug Use?: No Have You Felt Bad Or Guilty About Your Drinking Or Drug Use?: No Have You Ever Had a Drink or Used Drugs First Thing In The Morning to Steady Your Nerves or to Get Rid of a Hangover?: No CAGE-AID Score: 0  Substance Abuse Education Offered: Yes  Substance abuse interventions: Transport planner
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
## 2021-05-11 NOTE — Discharge Instructions (Addendum)
[facility] Discharge Instructions   General Discharge Instructions  WEIGHT BEARING STATUS: Right leg - Non-weightbearing in splint Left leg - Weightbearing as tolerated  RANGE OF MOTION/ACTIVITY: Right leg - Don't remove splint. Ok for knee motion. Ok to wiggle toes Left leg - Keep knee fully extended. You may open you knee immobilizer if you are sitting with your leg fully straight but make sure to strap the immobilizer back when you get up. Keep immobilizer strapped when sleeping  Wound Care: Right leg - Keep splint in place, do not remove. Keep it covered and dry when showering.  Left leg - You may remove your ace wrap and your incision can be left open to air if there is no drainage. Okay to shower if no drainage from incisions. Incision may get wet, allow soap and water to run over it.  DVT/PE prophylaxis:  Eliquis twice daily x 30 days  Diet: as you were eating previously.  Can use over the counter stool softeners and bowel preparations, such as Miralax, to help with bowel movements.  Narcotics can be constipating.  Be sure to drink plenty of fluids  PAIN MEDICATION USE AND EXPECTATIONS  You have likely been given narcotic medications to help control your pain.  After a traumatic event that results in an fracture (broken bone) with or without surgery, it is ok to use narcotic pain medications to help control one's pain.  We understand that everyone responds to pain differently and each individual patient will be evaluated on a regular basis for the continued need for narcotic medications. Ideally, narcotic medication use should last no more than 6-8 weeks (coinciding with fracture healing).   As a patient it is your responsibility as well to monitor narcotic medication use and report the amount and frequency you use these medications when you come to your office visit.   We would also advise that if you are using narcotic medications, you should take a dose prior to  therapy to maximize you participation.  IF YOU ARE ON NARCOTIC MEDICATIONS IT IS NOT PERMISSIBLE TO OPERATE A MOTOR VEHICLE (MOTORCYCLE/CAR/TRUCK/MOPED) OR HEAVY MACHINERY DO NOT MIX NARCOTICS WITH OTHER CNS (CENTRAL NERVOUS SYSTEM) DEPRESSANTS SUCH AS ALCOHOL   STOP SMOKING OR USING NICOTINE PRODUCTS!!!!  As discussed nicotine severely impairs your body's ability to heal surgical and traumatic wounds but also impairs bone healing.  Wounds and bone heal by forming microscopic blood vessels (angiogenesis) and nicotine is a vasoconstrictor (essentially, shrinks blood vessels).  Therefore, if vasoconstriction occurs to these microscopic blood vessels they essentially disappear and are unable to deliver necessary nutrients to the healing tissue.  This is one modifiable factor that you can do to dramatically increase your chances of healing your injury.    (This means no smoking, no nicotine gum, patches, etc)  DO NOT USE NONSTEROIDAL ANTI-INFLAMMATORY DRUGS (NSAID'S)  Using products such as Advil (ibuprofen), Aleve (naproxen), Motrin (ibuprofen) for additional pain control during fracture healing can delay and/or prevent the healing response.  If you would like to take over the counter (OTC) medication, Tylenol (acetaminophen) is ok.  However, some narcotic medications that are given for pain control contain acetaminophen as well. Therefore, you should not exceed more than 4000 mg of tylenol in a day if you do not have liver disease.  Also note that there are may OTC medicines, such as cold medicines and allergy medicines that my contain tylenol as well.  If you have any questions about medications and/or interactions  please ask your doctor/PA or your pharmacist.      ICE AND ELEVATE INJURED/OPERATIVE EXTREMITY  Using ice and elevating the injured extremity above your heart can help with swelling and pain control.  Icing in a pulsatile fashion, such as 20 minutes on and 20 minutes off, can be followed.     Do not place ice directly on skin. Make sure there is a barrier between to skin and the ice pack.    Using frozen items such as frozen peas works well as the conform nicely to the are that needs to be iced.  USE AN ACE WRAP OR TED HOSE FOR SWELLING CONTROL  In addition to icing and elevation, Ace wraps or TED hose are used to help limit and resolve swelling.  It is recommended to use Ace wraps or TED hose until you are informed to stop.    When using Ace Wraps start the wrapping distally (farthest away from the body) and wrap proximally (closer to the body)   Example: If you had surgery on your leg or thing and you do not have a splint on, start the ace wrap at the toes and work your way up to the thigh        If you had surgery on your upper extremity and do not have a splint on, start the ace wrap at your fingers and work your way up to the upper arm  IF YOU ARE IN A SPLINT OR CAST DO NOT [place]   If your splint gets wet for any reason please contact the office immediately. You may shower in your splint or cast as long as you keep it dry.  This can be done by wrapping in a cast cover or garbage back (or similar)  Do Not stick any thing down your splint or cast such as pencils, money, or hangers to try and scratch yourself with.  If you feel itchy take benadryl as prescribed on the bottle for itching  IF YOU ARE IN A CAM BOOT (BLACK BOOT)  You may remove boot periodically. Perform daily dressing changes as noted below.  Wash the liner of the boot regularly and wear a sock when wearing the boot. It is recommended that you sleep in the boot until told otherwise   CALL THE OFFICE WITH ANY QUESTIONS OR CONCERNS: [phone]   VISIT OUR WEBSITE FOR ADDITIONAL INFORMATION: [email or web address]     Discharge Wound Care Instructions  Do NOT apply any ointments, solutions or lotions to pin sites or surgical wounds.  These prevent needed drainage and even though solutions like  hydrogen peroxide kill bacteria, they also damage cells lining the pin sites that help fight infection.  Applying lotions or ointments can keep the wounds moist and can cause them to breakdown and open up as well. This can increase the risk for infection. When in doubt call the office.   If any drainage is noted, use one layer of adaptic, then gauze, Kerlix, and an ace wrap.  Once the incision is completely dry and without drainage, it may be left open to air out.  Showering may begin 36-48 hours later.  Cleaning gently with soap and water.

## 2021-05-11 NOTE — Anesthesia Postprocedure Evaluation (Signed)
Anesthesia Post Note  Patient: Gina Martin  Procedure(s) Performed: EXTERNAL FIXATION AND IRRIGATION AND DEBRIDMENT  PILON (Right) OPEN REDUCTION INTERNAL (ORIF) FIXATION PATELLA (Left: Knee)     Patient location during evaluation: PACU Anesthesia Type: General Level of consciousness: awake and alert Pain management: pain level controlled Vital Signs Assessment: post-procedure vital signs reviewed and stable Respiratory status: spontaneous breathing, nonlabored ventilation, respiratory function stable and patient connected to nasal cannula oxygen Cardiovascular status: blood pressure returned to baseline and stable Postop Assessment: no apparent nausea or vomiting Anesthetic complications: no   No notable events documented.  Last Vitals:  Vitals:   05/10/21 2042 05/11/21 0450  BP: (!) 140/93 122/79  Pulse: 100 94  Resp:    Temp: 36.8 C 36.9 C  SpO2: 100% 100%    Last Pain:  Vitals:   05/11/21 0450  TempSrc: Oral  PainSc:                  Teddie Mehta S
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
## 2021-05-12 ENCOUNTER — Encounter (HOSPITAL_COMMUNITY): Payer: Self-pay | Admitting: Student

## 2021-05-21 ENCOUNTER — Other Ambulatory Visit (HOSPITAL_COMMUNITY): Payer: Self-pay

## 2021-05-21 ENCOUNTER — Telehealth (HOSPITAL_COMMUNITY): Payer: Self-pay | Admitting: Pharmacy Technician

## 2021-05-21 NOTE — Telephone Encounter (Signed)
Pharmacy Transitions of Care Follow-up Telephone Call  Date of discharge: 05/11/2021  Discharge Diagnosis: DVT prophylaxis s/p fracture surgery  How have you been since you were released from the hospital? Patient reports feeling alright, pain is sometimes unbearable, but she is taking it one day at a time   Medication changes made at discharge:   START taking: acetaminophen (TYLENOL)  Eliquis (apixaban)  gabapentin (NEURONTIN)  Oxycodone HCl  Vitamin D3 (Vitamin D)  CHANGE how you take: methocarbamol (ROBAXIN)  STOP taking: chlordiazePOXIDE 25 MG capsule (LIBRIUM)  ketorolac 10 MG tablet (TORADOL)  ondansetron 4 MG disintegrating tablet (Zofran ODT)  ONE-A-DAY WOMENS PO   Medication changes verified by the patient? Yes    Medication Accessibility:  Home Pharmacy: [facility] [address] [place]   Was the patient provided with refills on discharged medications? No   Have all prescriptions been transferred from [facility] to home pharmacy? N/A   Is the patient able to afford medications?  Notable copays: Eliquis $4.00/month Eligible patient assistance: No   Medication Review:  APIXABAN (ELIQUIS)  Apixaban 2.5 mg BID initiated on 05/11/2021 for 1 month for DVT prophylaxis per MD. - Discussed importance of taking medication around the same time everyday  - Reviewed potential DDIs with patient  - Advised patient of medications to avoid (NSAIDs, ASA)  - Educated that Tylenol (acetaminophen) will be the preferred analgesic to prevent risk of bleeding  - Emphasized importance of monitoring for signs and symptoms of bleeding (abnormal bruising, prolonged bleeding, nose bleeds, bleeding from gums, discolored urine, black tarry stools)  - Advised patient to alert all providers of anticoagulation therapy prior to starting a new medication or having a procedure   Follow-up Appointments:  [facility] f/u appt confirmed? Scheduled to see Katha Hamming on 05/25/2021 @ 9:00 am.   If  their condition worsens, is the pt aware to call PCP or go to the Emergency Dept.? Yes  Final Patient Assessment:  -Pt is doing well.  -Pt verbalized understanding of Eliquis.  -Pt has post discharge appointment.  Parthenia Ames, PharmD

## 2021-07-09 ENCOUNTER — Ambulatory Visit: Payer: Medicaid Other | Attending: Student | Admitting: Physical Therapy

## 2021-07-09 ENCOUNTER — Other Ambulatory Visit: Payer: Self-pay

## 2021-07-09 DIAGNOSIS — M25662 Stiffness of left knee, not elsewhere classified: Secondary | ICD-10-CM | POA: Diagnosis present

## 2021-07-09 DIAGNOSIS — M25671 Stiffness of right ankle, not elsewhere classified: Secondary | ICD-10-CM | POA: Diagnosis present

## 2021-07-09 DIAGNOSIS — R600 Localized edema: Secondary | ICD-10-CM | POA: Diagnosis present

## 2021-07-09 DIAGNOSIS — R2689 Other abnormalities of gait and mobility: Secondary | ICD-10-CM

## 2021-07-09 DIAGNOSIS — R262 Difficulty in walking, not elsewhere classified: Secondary | ICD-10-CM

## 2021-07-09 DIAGNOSIS — M6281 Muscle weakness (generalized): Secondary | ICD-10-CM | POA: Diagnosis not present
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
## 2021-07-09 NOTE — Addendum Note (Signed)
Addended by: Jules Husbands MARIE L on: 07/09/2021 04:06 PM ? ? Modules accepted: Orders ? ?

## 2021-07-14 ENCOUNTER — Encounter: Payer: Medicaid Other | Admitting: Physical Therapy

## 2021-07-21 ENCOUNTER — Ambulatory Visit: Payer: Medicaid Other | Admitting: Physical Therapy

## 2021-07-21 ENCOUNTER — Other Ambulatory Visit: Payer: Self-pay

## 2021-07-21 DIAGNOSIS — R262 Difficulty in walking, not elsewhere classified: Secondary | ICD-10-CM

## 2021-07-21 DIAGNOSIS — M25662 Stiffness of left knee, not elsewhere classified: Secondary | ICD-10-CM

## 2021-07-21 DIAGNOSIS — M6281 Muscle weakness (generalized): Secondary | ICD-10-CM | POA: Diagnosis not present

## 2021-07-21 DIAGNOSIS — R2689 Other abnormalities of gait and mobility: Secondary | ICD-10-CM

## 2021-07-21 DIAGNOSIS — R600 Localized edema: Secondary | ICD-10-CM

## 2021-07-21 DIAGNOSIS — M25671 Stiffness of right ankle, not elsewhere classified: Secondary | ICD-10-CM
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
## 2021-07-28 ENCOUNTER — Other Ambulatory Visit: Payer: Self-pay

## 2021-07-28 ENCOUNTER — Ambulatory Visit: Payer: Medicaid Other | Admitting: Physical Therapy

## 2021-07-28 DIAGNOSIS — R262 Difficulty in walking, not elsewhere classified: Secondary | ICD-10-CM

## 2021-07-28 DIAGNOSIS — M6281 Muscle weakness (generalized): Secondary | ICD-10-CM

## 2021-07-28 DIAGNOSIS — M25671 Stiffness of right ankle, not elsewhere classified: Secondary | ICD-10-CM

## 2021-07-28 DIAGNOSIS — R2689 Other abnormalities of gait and mobility: Secondary | ICD-10-CM

## 2021-07-28 DIAGNOSIS — M25662 Stiffness of left knee, not elsewhere classified: Secondary | ICD-10-CM

## 2021-07-28 DIAGNOSIS — R600 Localized edema: Secondary | ICD-10-CM

## 2021-07-28 NOTE — Therapy (Signed)
[place] ?[facility] ?[place] ?[place], [place], [address] ?Phone: [phone]   Fax:  [phone] ? ?Physical Therapy Treatment ? ?Patient Details  ?Name: Gina Martin ?MRN: [id number] ?Date of Birth: [birth year] ?Referring Provider (PT): Haddix, Thomasene Lot, MD ? ? ?Encounter Date: 07/28/2021 ? ? PT End of Session - 07/28/21 1034   ? ? Visit Number 3   ? Number of Visits 16   ? Date for PT Re-Evaluation 10/29/21   ? Authorization Type Medicaid -- additional auth requested 07/28/21 for 12 visits   ? Authorization Time Period 12 visit request for 08/04/21 to 09/14/21   ? Authorization - Visit Number 2   ? Authorization - Number of Visits 3   ? PT Start Time [phone]   ? PT Stop Time 1015   ? PT Time Calculation (min) 42 min   ? ?  ?  ? ?  ? ? ?Past Medical History:  ?Diagnosis Date  ? Asthma   ? Bipolar 1 disorder ([place])   ? Hyperlipidemia   ? Preeclampsia   ? Schizophrenia ([place])   ? ? ?Past Surgical History:  ?Procedure Laterality Date  ? CESAREAN SECTION    ? EXTERNAL FIXATION LEG Right 05/07/2021  ? Procedure: EXTERNAL FIXATION AND IRRIGATION AND DEBRIDMENT  PILON;  Surgeon: Shona Needles, MD;  Location: [place];  Service: Orthopedics;  Laterality: Right;  ? EXTERNAL FIXATION REMOVAL Right 05/10/2021  ? Procedure: REMOVAL EXTERNAL FIXATION LEG;  Surgeon: Shona Needles, MD;  Location: [place];  Service: Orthopedics;  Laterality: Right;  ? OPEN REDUCTION INTERNAL FIXATION (ORIF) TIBIA/FIBULA FRACTURE Right 05/10/2021  ? Procedure: OPEN REDUCTION INTERNAL FIXATION (ORIF) TIBIA/FIBULA FRACTURE;  Surgeon: Shona Needles, MD;  Location: [place];  Service: Orthopedics;  Laterality: Right;  ? ORIF PATELLA Left 05/07/2021  ? Procedure: OPEN REDUCTION INTERNAL (ORIF) FIXATION PATELLA;  Surgeon: Shona Needles, MD;  Location: [facility];  Service: Orthopedics;  Laterality: Left;  ? TUBAL LIGATION    ? ? ?There were no vitals filed for this visit. ? ? Subjective Assessment - 07/28/21 0933   ? ?  Subjective Pt is to see doctor tomorrow. Pt has been trying to work on her exercises. Notes some continued knee popping at end range SAQ.   ? Patient is accompained by: Family member   ? Limitations Standing;Lifting;Walking;House hold activities   ? How long can you sit comfortably? n/a   ? How long can you stand comfortably? A few seconds   ? How long can you walk comfortably? ~75' with RW (can be limited to pain or fatigue)   ? Patient Stated Goals Walk and return to PLOF   ? Currently in Pain? No/denies   ? ?  ?  ? ?  ? ? ? ? ? [facility] PT Assessment - 07/28/21 0001   ? ?  ? Assessment  ? Medical Diagnosis Left Patella fx, Right Pilon fx S/P ORIF;   ? Referring Provider (PT) Haddix, Thomasene Lot, MD   ? Onset Date/Surgical Date 05/07/21   ? Hand Dominance Right   ? ?  ?  ? ?  ? ? ? ? ? ? ? ? ? ? ? ? ? ? ? ? [place] [facility] Adult PT Treatment/Exercise - 07/28/21 0001   ? ?  ? Exercises  ? Exercises Knee/Hip   ?  ? Knee/Hip Exercises: Stretches  ? Quad Stretch Right;Left;30 seconds   ?  ? Knee/Hip Exercises: Supine  ? [employer]  10 reps   ? Quad Sets Limitations in hamstring stretch   ? Short Arc [employer] Strengthening;Left;2 sets;10 reps   less popping in this position  ? Short Arc [employer] Limitations with ball squeeze   ?  ? Knee/Hip Exercises: Sidelying  ? Hip ADduction Strengthening;Right;Left;10 reps;3 sets   ? Clams green tband 3x10   ?  ? Knee/Hip Exercises: Prone  ? Hamstring Curl 3 sets;10 reps   ? Hamstring Curl Limitations red tband   ? Hip Extension Strengthening;3 sets;10 reps   ? Hip Extension Limitations red tband   ? ?  ?  ? ?  ? ? ? ? ? ? ? ? ? ? ? ? PT Short Term Goals - 07/28/21 1028   ? ?  ? PT SHORT TERM GOAL #1  ? Title Pt will be independent with initial HEP   ? Time 3   ? Period Weeks   ? Status Achieved   ? Target Date 07/30/21   ?  ? PT SHORT TERM GOAL #2  ? Title Pt will improve R ankle ROM to at least within 5 deg of neutral   ? Time 3   ? Period Weeks   ? Status Partially Met   ? Target Date 07/30/21   ?   ? PT SHORT TERM GOAL #3  ? Title Pt will be able to stand on L LE x5 min without use of knee brace   ? Baseline Able to hop on L LE with RW support   ? Time 3   ? Period Weeks   ? Status Achieved   ? Target Date 07/30/21   ?  ? PT SHORT TERM GOAL #4  ? Title Pt will be able to amb with RW x 150' maintaining weight bearing precautions (R LE NWB) to demo improved household mobility   ? Baseline Able to amb in/out of clinic >150' with RW R LE NWB   ? Time 3   ? Period Weeks   ? Status Achieved   ? Target Date 07/30/21   ? ?  ?  ? ?  ? ? ? ? PT Long Term Goals - 07/09/21 1600   ? ?  ? PT LONG TERM GOAL #1  ? Title Pt will be independent with final HEP   ? Time 12   ? Period Weeks   ? Status New   ? Target Date 10/01/21   ?  ? PT LONG TERM GOAL #2  ? Title Pt will be able to amb at least 1000' with LRAD for improved community mobility   ? Time 12   ? Period Weeks   ? Status New   ? Target Date 10/01/21   ?  ? PT LONG TERM GOAL #3  ? Title Pt will be able to perform 5x STS without UE support to demo improved functional strength   ? Time 12   ? Period Weeks   ? Status New   ? Target Date 10/01/21   ?  ? PT LONG TERM GOAL #4  ? Title Pt will have R = L ankle ROM to be able to ascend/descend steps   ? Time 12   ? Period Weeks   ? Status New   ? Target Date 10/01/21   ?  ? PT LONG TERM GOAL #5  ? Title Pt will have improved FOTO score to predicted level   ? Time 12   ? Period Weeks   ?  Status New   ? Target Date 10/01/21   ? ?  ?  ? ?  ? ? ? ? ? ? ? ? Plan - 07/28/21 0948   ? ? Clinical Impression Statement Pt better able to maintain proper tibiofemoral when cued to squeeze ball during SAQ. Provided hip strengthening exercises this session. Progressed LE exercises.   ? Personal Factors and Comorbidities Age;Fitness;Time since onset of injury/illness/exacerbation;Past/Current Experience   ? Examination-Activity Limitations Locomotion Level;Transfers;Stairs;Stand;Dressing;Hygiene/Grooming;Bend;Toileting;Lift   ?  Examination-Participation Restrictions Cleaning;Community Activity;Driving;Occupation   ? Stability/Clinical Decision Making Evolving/Moderate complexity   ? Rehab Potential Good   ? PT Frequency 2x / week   1x/wk for first 3 weeks for Medicaid auth; transition to 2-3x/wk there after for 12 weeks total  ? PT Duration 12 weeks   ? PT Treatment/Interventions ADLs/Self Care Home Management;Aquatic Therapy;Iontophoresis 28m/ml Dexamethasone;Moist Heat;DME Instruction;Cryotherapy;Electrical Stimulation;Gait training;Stair training;Functional mobility training;Therapeutic activities;Therapeutic exercise;Balance training;Neuromuscular re-education;Manual techniques;Patient/family education;Orthotic Fit/Training;Scar mobilization;Passive range of motion;Dry needling;Taping;Vasopneumatic Device;Joint Manipulations   ? PT Next Visit Plan Assess response to initial HEP. Assess FOTO, transfers, and standing. Continue R ankle ROM and strengthening, L knee ROM and strengthening.   ? PT Home Exercise Plan Access Code [email or web address]  ? Consulted and Agree with Plan of Care Patient;Family member/caregiver   ? ?  ?  ? ?  ? ? ?Patient will benefit from skilled therapeutic intervention in order to improve the following deficits and impairments:  Decreased range of motion, Difficulty walking, Increased fascial restricitons, Abnormal gait, Increased muscle spasms, Decreased activity tolerance, Pain, Decreased balance, Hypomobility, Impaired flexibility, Improper body mechanics, Decreased mobility, Decreased strength, Increased edema, Impaired sensation ? ?Visit Diagnosis: ?Muscle weakness (generalized) ? ?Difficulty in walking, not elsewhere classified ? ?Localized edema ? ?Other abnormalities of gait and mobility ? ?Stiffness of right ankle, not elsewhere classified ? ?Stiffness of left knee, not elsewhere classified ? ? ? ? ?Problem List ?Patient Active Problem List  ? Diagnosis Date Noted  ? Left patella fracture 05/10/2021  ? Closed  fracture of lateral portion of right tibial plateau 05/10/2021  ? Open pilon fracture, right, type III, initial encounter 05/07/2021  ? ? ?[place] April MGordy Levan PT, DPT ?07/28/2021, 10:36 AM ? ?[facility] ?Outpatient

## 2021-08-02 ENCOUNTER — Ambulatory Visit: Payer: Medicaid Other | Attending: Student | Admitting: Physical Therapy

## 2021-08-02 DIAGNOSIS — R600 Localized edema: Secondary | ICD-10-CM | POA: Insufficient documentation

## 2021-08-02 DIAGNOSIS — R262 Difficulty in walking, not elsewhere classified: Secondary | ICD-10-CM | POA: Diagnosis present

## 2021-08-02 DIAGNOSIS — M6281 Muscle weakness (generalized): Secondary | ICD-10-CM | POA: Diagnosis present

## 2021-08-02 DIAGNOSIS — M25662 Stiffness of left knee, not elsewhere classified: Secondary | ICD-10-CM | POA: Insufficient documentation

## 2021-08-02 DIAGNOSIS — R2689 Other abnormalities of gait and mobility: Secondary | ICD-10-CM | POA: Insufficient documentation

## 2021-08-02 DIAGNOSIS — M25671 Stiffness of right ankle, not elsewhere classified: Secondary | ICD-10-CM | POA: Diagnosis present

## 2021-08-02 NOTE — Therapy (Signed)
[place] ?[facility] ?[place] ?[place], [place], [address] ?Phone: [phone]   Fax:  [phone] ? ?Physical Therapy Treatment ? ?Patient Details  ?Name: Gina Martin ?MRN: [id number] ?Date of Birth: 1990/05/26 ?Referring Provider (PT): Haddix, Thomasene Lot, MD ? ? ?Encounter Date: 08/02/2021 ? ? PT End of Session - 08/02/21 0930   ? ? Visit Number 4   ? Number of Visits 16   ? Date for PT Re-Evaluation 10/29/21   ? Authorization Type Medicaid -- additional auth requested 07/28/21 for 12 visits   ? Authorization Time Period 12 visit request for 08/04/21 to 09/14/21   ? Authorization - Visit Number 3   ? Authorization - Number of Visits 3   ? PT Start Time 0930   ? PT Stop Time 1015   ? PT Time Calculation (min) 45 min   ? Activity Tolerance Patient tolerated treatment well   ? Behavior During Therapy [facility] for tasks assessed/performed   ? ?  ?  ? ?  ? ? ?Past Medical History:  ?Diagnosis Date  ? Asthma   ? Bipolar 1 disorder ([place])   ? Hyperlipidemia   ? Preeclampsia   ? Schizophrenia ([place])   ? ? ?Past Surgical History:  ?Procedure Laterality Date  ? CESAREAN SECTION    ? EXTERNAL FIXATION LEG Right 05/07/2021  ? Procedure: EXTERNAL FIXATION AND IRRIGATION AND DEBRIDMENT  PILON;  Surgeon: Shona Needles, MD;  Location: [facility] [place];  Service: Orthopedics;  Laterality: Right;  ? EXTERNAL FIXATION REMOVAL Right 05/10/2021  ? Procedure: REMOVAL EXTERNAL FIXATION LEG;  Surgeon: Shona Needles, MD;  Location: [facility];  Service: Orthopedics;  Laterality: Right;  ? OPEN REDUCTION INTERNAL FIXATION (ORIF) TIBIA/FIBULA FRACTURE Right 05/10/2021  ? Procedure: OPEN REDUCTION INTERNAL FIXATION (ORIF) TIBIA/FIBULA FRACTURE;  Surgeon: Shona Needles, MD;  Location: [place];  Service: Orthopedics;  Laterality: Right;  ? ORIF PATELLA Left 05/07/2021  ? Procedure: OPEN REDUCTION INTERNAL (ORIF) FIXATION PATELLA;  Surgeon: Shona Needles, MD;  Location: [place];  Service: Orthopedics;  Laterality: Left;  ?  TUBAL LIGATION    ? ? ?There were no vitals filed for this visit. ? ? Subjective Assessment - 08/02/21 0932   ? ? Subjective Pt states she is actually supposed to see her doctor tomorrow. Has been doing her exercises.   ? Patient is accompained by: Family member   ? Limitations Standing;Lifting;Walking;House hold activities   ? How long can you sit comfortably? n/a   ? How long can you stand comfortably? A few seconds   ? How long can you walk comfortably? ~63' with RW (can be limited to pain or fatigue)   ? Patient Stated Goals Walk and return to PLOF   ? Currently in Pain? No/denies   ? ?  ?  ? ?  ? ? ? ? ? [facility] PT Assessment - 08/02/21 0001   ? ?  ? Assessment  ? Medical Diagnosis Left Patella fx, Right Pilon fx S/P ORIF;   ? Referring Provider (PT) Haddix, Thomasene Lot, MD   ? Onset Date/Surgical Date 05/07/21   ? Hand Dominance Right   ? Next MD Visit 08/03/21   ? ?  ?  ? ?  ? ? ? ? ? ? ? ? ? ? ? ? ? ? ? ? [facility] Adult PT Treatment/Exercise - 08/02/21 0001   ? ?  ? Knee/Hip Exercises: Stretches  ? Passive Hamstring Stretch Right;Left;30 seconds   ?  Quad Stretch Right;Left;30 seconds   ? Quad Stretch Limitations with strap   ? Gastroc Stretch Right;Left;2 reps;30 seconds   ? Gastroc Stretch Limitations with strap   ?  ? Knee/Hip Exercises: Seated  ? Long [employer] Strengthening;Right;Left;2 sets;10 reps   ? Long Arc Quad Weight 2 lbs.   ? Long Scientist, research (life sciences) on L   ?  ? Knee/Hip Exercises: Supine  ? Short Arc [employer] Strengthening;Left;2 sets;10 reps   ? Short Arc [employer] Limitations 1.5 lb weight   ? Straight Leg Raises Strengthening;Right;Left;2 sets;10 reps   ? Straight Leg Raises Limitations 5x5 sec iso holds on L   ?  ? Knee/Hip Exercises: Prone  ? Hamstring Curl 2 sets;10 reps   ? Hamstring Curl Limitations 1.5 lbs   ?  ? Ankle Exercises: Seated  ? Heel Raises Both;20 reps   ? Other Seated Ankle Exercises green tband: PF, DF, Inv/Ev x20   ? ?  ?  ? ?  ? ? ? ? ? ? ? ? ? ? ? ? PT Short Term Goals  - 07/28/21 1028   ? ?  ? PT SHORT TERM GOAL #1  ? Title Pt will be independent with initial HEP   ? Time 3   ? Period Weeks   ? Status Achieved   ? Target Date 07/30/21   ?  ? PT SHORT TERM GOAL #2  ? Title Pt will improve R ankle ROM to at least within 5 deg of neutral   ? Time 3   ? Period Weeks   ? Status Partially Met   ? Target Date 07/30/21   ?  ? PT SHORT TERM GOAL #3  ? Title Pt will be able to stand on L LE x5 min without use of knee brace   ? Baseline Able to hop on L LE with RW support   ? Time 3   ? Period Weeks   ? Status Achieved   ? Target Date 07/30/21   ?  ? PT SHORT TERM GOAL #4  ? Title Pt will be able to amb with RW x 150' maintaining weight bearing precautions (R LE NWB) to demo improved household mobility   ? Baseline Able to amb in/out of clinic >150' with RW R LE NWB   ? Time 3   ? Period Weeks   ? Status Achieved   ? Target Date 07/30/21   ? ?  ?  ? ?  ? ? ? ? PT Long Term Goals - 07/09/21 1600   ? ?  ? PT LONG TERM GOAL #1  ? Title Pt will be independent with final HEP   ? Time 12   ? Period Weeks   ? Status New   ? Target Date 10/01/21   ?  ? PT LONG TERM GOAL #2  ? Title Pt will be able to amb at least 1000' with LRAD for improved community mobility   ? Time 12   ? Period Weeks   ? Status New   ? Target Date 10/01/21   ?  ? PT LONG TERM GOAL #3  ? Title Pt will be able to perform 5x STS without UE support to demo improved functional strength   ? Time 12   ? Period Weeks   ? Status New   ? Target Date 10/01/21   ?  ? PT LONG TERM GOAL #4  ? Title Pt will have R = L  ankle ROM to be able to ascend/descend steps   ? Time 12   ? Period Weeks   ? Status New   ? Target Date 10/01/21   ?  ? PT LONG TERM GOAL #5  ? Title Pt will have improved FOTO score to predicted level   ? Time 12   ? Period Weeks   ? Status New   ? Target Date 10/01/21   ? ?  ?  ? ?  ? ? ? ? ? ? ? ? Plan - 08/02/21 0957   ? ? Clinical Impression Statement Pt able to perform SAQ with less patellar popping. Tolerated full LAQ  ROM while maintaining tibiofemoral alignment with cueing -- still tends to want to externally rotate at end range. Continued to strengthen bilat LE as able.   ? Personal Factors and Comorbidities Age;Fitness;Time since onset of injury/illness/exacerbation;Past/Current Experience   ? Examination-Activity Limitations Locomotion Level;Transfers;Stairs;Stand;Dressing;Hygiene/Grooming;Bend;Toileting;Lift   ? Examination-Participation Restrictions Cleaning;Community Activity;Driving;Occupation   ? Stability/Clinical Decision Making Evolving/Moderate complexity   ? Rehab Potential Good   ? PT Frequency 2x / week   1x/wk for first 3 weeks for Medicaid auth; transition to 2-3x/wk there after for 12 weeks total  ? PT Duration 12 weeks   ? PT Treatment/Interventions ADLs/Self Care Home Management;Aquatic Therapy;Iontophoresis 4mg /ml Dexamethasone;Moist Heat;DME Instruction;Cryotherapy;Electrical Stimulation;Gait training;Stair training;Functional mobility training;Therapeutic activities;Therapeutic exercise;Balance training;Neuromuscular re-education;Manual techniques;Patient/family education;Orthotic Fit/Training;Scar mobilization;Passive range of motion;Dry needling;Taping;Vasopneumatic Device;Joint Manipulations   ? PT Next Visit Plan Assess response to initial HEP. Assess FOTO, transfers, and standing. Continue R ankle ROM and strengthening, L knee ROM and strengthening.   ? PT Home Exercise Plan Access Code [email or web address]   ? Consulted and Agree with Plan of Care Patient;Family member/caregiver   ? ?  ?  ? ?  ? ? ?Patient will benefit from skilled therapeutic intervention in order to improve the following deficits and impairments:  Decreased range of motion, Difficulty walking, Increased fascial restricitons, Abnormal gait, Increased muscle spasms, Decreased activity tolerance, Pain, Decreased balance, Hypomobility, Impaired flexibility, Improper body mechanics, Decreased mobility, Decreased strength, Increased edema, Impaired  sensation ? ?Visit Diagnosis: ?Muscle weakness (generalized) ? ?Difficulty in walking, not elsewhere classified ? ?Localized edema ? ?Other abnormalities of gait and mobility ? ?Stiffness of right ankle

## 2021-08-04 ENCOUNTER — Ambulatory Visit: Payer: Medicaid Other | Admitting: Physical Therapy

## 2021-08-04 DIAGNOSIS — R600 Localized edema: Secondary | ICD-10-CM

## 2021-08-04 DIAGNOSIS — M6281 Muscle weakness (generalized): Secondary | ICD-10-CM

## 2021-08-04 DIAGNOSIS — M25662 Stiffness of left knee, not elsewhere classified: Secondary | ICD-10-CM

## 2021-08-04 DIAGNOSIS — M25671 Stiffness of right ankle, not elsewhere classified: Secondary | ICD-10-CM

## 2021-08-04 DIAGNOSIS — R262 Difficulty in walking, not elsewhere classified: Secondary | ICD-10-CM

## 2021-08-04 DIAGNOSIS — R2689 Other abnormalities of gait and mobility: Secondary | ICD-10-CM

## 2021-08-04 NOTE — Patient Instructions (Signed)
Aquatic Therapy: What to Expect! ° °Where:  °[facility] [address] °[address] °[place], [place] [address] °[phone]          ° °How to Prepare: ° °If you require assistance with dressing, with transportation (ie: wheel chair), or toileting, a caregiver must attend the entire session with you (unless your primary therapists feels this is not necessary).   °If there is thunder during your appointment, you will be asked to leave pool area. You have the option to finish your session in the physical therapy area near the gym. °Masks in the pool area are optional. Your face will remain dry during your session, so you are welcome to keep your mask on, if desired. You will be spaced at least 6 feet from other aquatic patients.  °Please bring your own swim towel to dry off with.   °There are Men's and Women's locker rooms with showers, as well as gender neutral bathrooms in the pool area.  °Please arrive IN YOUR SUIT and a few minutes prior to your appointment - this helps to avoid delays in starting your session. °Head to the pool and await your appointment on the bench on the pool deck. °Please make sure to attend to any toileting needs prior to entering the pool. °Once on the pool deck your therapist will ask you to sign the Patient  Consent and Assignment of Benefits form. °Your therapist may take your blood pressure prior to, during and after your session if indicated. °We usually try and create a home exercise program based on activities we do in the pool. Some patients do not want to or do not have the ability to participate in an aquatic home program - this is not a barrier in any way to you participating in aquatic therapy as part of your current therapy plan! ° °Appointments:  All sessions are 45 minutes ° °About the pool: °Entering the pool: °Your therapist will assist you if needed; there are two ways to enter the pool - stairs or a mechanical lift. Your therapist will determine  the most appropriate way for you. °Water temperature is usually around 91-95°.  There is a lap pool with a temperature around 84° °There may be other therapists and patients in the pool at the same time.  ° °Contact Info:            °To cancel appointment, please call [facility] at [phone] °If you are running late, please call [facility] at [phone]    °        °     ° ° °

## 2021-08-04 NOTE — Therapy (Signed)
[facility] ?[place] ?[place], [place], [address] ?Phone: [phone]   Fax:  [phone] ? ?Physical Therapy Treatment ? ?Patient Details  ?Name: Gina Martin ?MRN: [id number] ?Date of Birth: 04/01/1991 ?Referring Provider (PT): Haddix, Thomasene Lot, MD ? ? ?Encounter Date: 08/04/2021 ? ? PT End of Session - 08/04/21 3382   ? ? Visit Number 5   ? Number of Visits 16   ? Date for PT Re-Evaluation 10/29/21   ? Authorization Type Medicaid -- additional auth requested 07/28/21 for 12 visits   ? Authorization Time Period 12 visit request for 08/04/21 to 09/14/21   ? Authorization - Visit Number 4   ? Authorization - Number of Visits 12   ? PT Start Time [phone]   ? PT Stop Time 1015   ? PT Time Calculation (min) 38 min   ? Activity Tolerance Patient tolerated treatment well   ? Behavior During Therapy [facility] for tasks assessed/performed   ? ?  ?  ? ?  ? ? ?Past Medical History:  ?Diagnosis Date  ? Asthma   ? Bipolar 1 disorder ([place])   ? Hyperlipidemia   ? Preeclampsia   ? Schizophrenia ([place])   ? ? ?Past Surgical History:  ?Procedure Laterality Date  ? CESAREAN SECTION    ? EXTERNAL FIXATION LEG Right 05/07/2021  ? Procedure: EXTERNAL FIXATION AND IRRIGATION AND DEBRIDMENT  PILON;  Surgeon: Shona Needles, MD;  Location: [place];  Service: Orthopedics;  Laterality: Right;  ? EXTERNAL FIXATION REMOVAL Right 05/10/2021  ? Procedure: REMOVAL EXTERNAL FIXATION LEG;  Surgeon: Shona Needles, MD;  Location: [place];  Service: Orthopedics;  Laterality: Right;  ? OPEN REDUCTION INTERNAL FIXATION (ORIF) TIBIA/FIBULA FRACTURE Right 05/10/2021  ? Procedure: OPEN REDUCTION INTERNAL FIXATION (ORIF) TIBIA/FIBULA FRACTURE;  Surgeon: Shona Needles, MD;  Location: [place];  Service: Orthopedics;  Laterality: Right;  ? ORIF PATELLA Left 05/07/2021  ? Procedure: OPEN REDUCTION INTERNAL (ORIF) FIXATION PATELLA;  Surgeon: Shona Needles, MD;  Location: [place];  Service: Orthopedics;  Laterality: Left;  ?  TUBAL LIGATION    ? ? ?There were no vitals filed for this visit. ? ? Subjective Assessment - 08/04/21 0942   ? ? Subjective Pt saw doctor and they advanced her to 50% PWB on R LE. No other new issues. Pt states that ortho has told her that her ankle is fully healed but part of her tibia is still not fully healed.   ? Patient is accompained by: Family member   ? Limitations Standing;Lifting;Walking;House hold activities   ? How long can you sit comfortably? n/a   ? How long can you stand comfortably? A few seconds   ? How long can you walk comfortably? ~54' with RW (can be limited to pain or fatigue)   ? Patient Stated Goals Walk and return to PLOF   ? Currently in Pain? No/denies   ? ?  ?  ? ?  ? ? ? ? ? [facility] PT Assessment - 08/04/21 0001   ? ?  ? Assessment  ? Medical Diagnosis Left Patella fx, Right Pilon fx S/P ORIF;   ? Referring Provider (PT) Haddix, Thomasene Lot, MD   ? Onset Date/Surgical Date 05/07/21   ? Hand Dominance Right   ? Next MD Visit 08/03/21   ?  ? Precautions  ? Precaution Comments Advance to PWB 50% R LE. Continue working on ankle ROM as tolerated   ? ?  ?  ? ?  ? ? ? ? ? ? ? ? ? ? ? ? ? ? ? ?  [facility] Adult PT Treatment/Exercise - 08/04/21 0001   ? ?  ? Knee/Hip Exercises: Stretches  ? Other Knee/Hip Stretches knee flexion + ankle DF on step x10   ?  ? Knee/Hip Exercises: Aerobic  ? Nustep L5 x 6 min UEs/LEs   ?  ? Manual Therapy  ? Manual therapy comments gentle grade II R ankle mobilization: talocrural distraction, hindfoot inv/ev, tib/fib PA/AP, talocrural DF   ?  ? Ankle Exercises: Seated  ? Ankle Circles/Pumps AROM;Right;10 reps   ? BAPS Sitting;Level 4;15 reps   inv/ev, PF/DF, CW & CCW  ? ?  ?  ? ?  ? ? ? ? ? ? ? ? ? ? ? ? PT Short Term Goals - 07/28/21 1028   ? ?  ? PT SHORT TERM GOAL #1  ? Title Pt will be independent with initial HEP   ? Time 3   ? Period Weeks   ? Status Achieved   ? Target Date 07/30/21   ?  ? PT SHORT TERM GOAL #2  ? Title Pt will improve R ankle ROM to at least within 5 deg  of neutral   ? Time 3   ? Period Weeks   ? Status Partially Met   ? Target Date 07/30/21   ?  ? PT SHORT TERM GOAL #3  ? Title Pt will be able to stand on L LE x5 min without use of knee brace   ? Baseline Able to hop on L LE with RW support   ? Time 3   ? Period Weeks   ? Status Achieved   ? Target Date 07/30/21   ?  ? PT SHORT TERM GOAL #4  ? Title Pt will be able to amb with RW x 150' maintaining weight bearing precautions (R LE NWB) to demo improved household mobility   ? Baseline Able to amb in/out of clinic >150' with RW R LE NWB   ? Time 3   ? Period Weeks   ? Status Achieved   ? Target Date 07/30/21   ? ?  ?  ? ?  ? ? ? ? PT Long Term Goals - 07/09/21 1600   ? ?  ? PT LONG TERM GOAL #1  ? Title Pt will be independent with final HEP   ? Time 12   ? Period Weeks   ? Status New   ? Target Date 10/01/21   ?  ? PT LONG TERM GOAL #2  ? Title Pt will be able to amb at least 1000' with LRAD for improved community mobility   ? Time 12   ? Period Weeks   ? Status New   ? Target Date 10/01/21   ?  ? PT LONG TERM GOAL #3  ? Title Pt will be able to perform 5x STS without UE support to demo improved functional strength   ? Time 12   ? Period Weeks   ? Status New   ? Target Date 10/01/21   ?  ? PT LONG TERM GOAL #4  ? Title Pt will have R = L ankle ROM to be able to ascend/descend steps   ? Time 12   ? Period Weeks   ? Status New   ? Target Date 10/01/21   ?  ? PT LONG TERM GOAL #5  ? Title Pt will have improved FOTO score to predicted level   ? Time 12   ? Period Weeks   ? Status  New   ? Target Date 10/01/21   ? ?  ?  ? ?  ? ? ? ? ? ? ? ? Plan - 08/04/21 1023   ? ? Clinical Impression Statement Initiated a few gentle standing exercises to work on knee and ankle ROM. Performed gentle manual work through R ankle to help improve range. Pt reports improved ability to perform BAP board this session. Discussed initiating aquatic therapy.   ? Personal Factors and Comorbidities Age;Fitness;Time since onset of  injury/illness/exacerbation;Past/Current Experience   ? Examination-Activity Limitations Locomotion Level;Transfers;Stairs;Stand;Dressing;Hygiene/Grooming;Bend;Toileting;Lift   ? Examination-Participation Restrictions Cleaning;Community Activity;Driving;Occupation   ? Stability/Clinical Decision Making Evolving/Moderate complexity   ? Rehab Potential Good   ? PT Frequency 2x / week   1x/wk for first 3 weeks for Medicaid auth; transition to 2-3x/wk there after for 12 weeks total  ? PT Duration 12 weeks   ? PT Treatment/Interventions ADLs/Self Care Home Management;Aquatic Therapy;Iontophoresis 4mg /ml Dexamethasone;Moist Heat;DME Instruction;Cryotherapy;Electrical Stimulation;Gait training;Stair training;Functional mobility training;Therapeutic activities;Therapeutic exercise;Balance training;Neuromuscular re-education;Manual techniques;Patient/family education;Orthotic Fit/Training;Scar mobilization;Passive range of motion;Dry needling;Taping;Vasopneumatic Device;Joint Manipulations   ? PT Next Visit Plan Assess response to initial HEP. Assess FOTO. Continue R ankle ROM and strengthening, L knee ROM and strengthening.   ? PT Home Exercise Plan Access Code [email or web address]   ? Consulted and Agree with Plan of Care Patient;Family member/caregiver   ? ?  ?  ? ?  ? ? ?Patient will benefit from skilled therapeutic intervention in order to improve the following deficits and impairments:  Decreased range of motion, Difficulty walking, Increased fascial restricitons, Abnormal gait, Increased muscle spasms, Decreased activity tolerance, Pain, Decreased balance, Hypomobility, Impaired flexibility, Improper body mechanics, Decreased mobility, Decreased strength, Increased edema, Impaired sensation ? ?Visit Diagnosis: ?Muscle weakness (generalized) ? ?Difficulty in walking, not elsewhere classified ? ?Localized edema ? ?Other abnormalities of gait and mobility ? ?Stiffness of right ankle, not elsewhere classified ? ?Stiffness of left  knee, not elsewhere classified ? ? ? ? ?Problem List ?Patient Active Problem List  ? Diagnosis Date Noted  ? Left patella fracture 05/10/2021  ? Closed fracture of lateral portion of right tibial plateau 05/10/2021  ? [place]

## 2021-08-09 ENCOUNTER — Ambulatory Visit: Payer: Medicaid Other | Admitting: Physical Therapy

## 2021-08-09 DIAGNOSIS — R262 Difficulty in walking, not elsewhere classified: Secondary | ICD-10-CM

## 2021-08-09 DIAGNOSIS — R2689 Other abnormalities of gait and mobility: Secondary | ICD-10-CM

## 2021-08-09 DIAGNOSIS — M6281 Muscle weakness (generalized): Secondary | ICD-10-CM | POA: Diagnosis not present

## 2021-08-09 DIAGNOSIS — R600 Localized edema: Secondary | ICD-10-CM

## 2021-08-09 DIAGNOSIS — M25671 Stiffness of right ankle, not elsewhere classified: Secondary | ICD-10-CM

## 2021-08-09 DIAGNOSIS — M25662 Stiffness of left knee, not elsewhere classified: Secondary | ICD-10-CM

## 2021-08-09 NOTE — Therapy (Signed)
[place] ?[facility] ?[place] ?[place], [place], [address] ?Phone: [phone]   Fax:  [phone] ? ?Physical Therapy Treatment ? ?Patient Details  ?Name: Gina Martin ?MRN: [id number] ?Date of Birth: [birth year] ?Referring Provider (PT): Haddix, Thomasene Lot, MD ? ? ?Encounter Date: 08/09/2021 ? ? PT End of Session - 08/09/21 0931   ? ? Visit Number 6   ? Number of Visits 16   ? Date for PT Re-Evaluation 10/29/21   ? Authorization Type Medicaid -- additional auth requested 07/28/21 for 12 visits   ? Authorization Time Period 12 visit request for 08/04/21 to 09/14/21   ? Authorization - Visit Number 5   ? Authorization - Number of Visits 12   ? PT Start Time 0930   ? PT Stop Time 1015   ? PT Time Calculation (min) 45 min   ? Activity Tolerance Patient tolerated treatment well   ? Behavior During Therapy [facility] for tasks assessed/performed   ? ?  ?  ? ?  ? ? ?Past Medical History:  ?Diagnosis Date  ? Asthma   ? Bipolar 1 disorder ([place])   ? Hyperlipidemia   ? Preeclampsia   ? Schizophrenia ([place])   ? ? ?Past Surgical History:  ?Procedure Laterality Date  ? CESAREAN SECTION    ? EXTERNAL FIXATION LEG Right 05/07/2021  ? Procedure: EXTERNAL FIXATION AND IRRIGATION AND DEBRIDMENT  PILON;  Surgeon: Shona Needles, MD;  Location: [place];  Service: Orthopedics;  Laterality: Right;  ? EXTERNAL FIXATION REMOVAL Right 05/10/2021  ? Procedure: REMOVAL EXTERNAL FIXATION LEG;  Surgeon: Shona Needles, MD;  Location: [place];  Service: Orthopedics;  Laterality: Right;  ? OPEN REDUCTION INTERNAL FIXATION (ORIF) TIBIA/FIBULA FRACTURE Right 05/10/2021  ? Procedure: OPEN REDUCTION INTERNAL FIXATION (ORIF) TIBIA/FIBULA FRACTURE;  Surgeon: Shona Needles, MD;  Location: [place];  Service: Orthopedics;  Laterality: Right;  ? ORIF PATELLA Left 05/07/2021  ? Procedure: OPEN REDUCTION INTERNAL (ORIF) FIXATION PATELLA;  Surgeon: Shona Needles, MD;  Location: [place];  Service: Orthopedics;  Laterality: Left;   ? TUBAL LIGATION    ? ? ?There were no vitals filed for this visit. ? ? Subjective Assessment - 08/09/21 0932   ? ? Subjective Pt reports some increased soreness today. Pt notes that she did cook some for Easter. Notes most of soreness is in her ankle. Knee is fine. Pt reports some aching with the rain.   ? Patient is accompained by: Family member   ? Limitations Standing;Lifting;Walking;House hold activities   ? How long can you sit comfortably? n/a   ? How long can you stand comfortably? A few seconds   ? How long can you walk comfortably? ~52' with RW (can be limited to pain or fatigue)   ? Patient Stated Goals Walk and return to PLOF   ? ?  ?  ? ?  ? ? ? ? ? [facility] PT Assessment - 08/09/21 0001   ? ?  ? Assessment  ? Medical Diagnosis Left Patella fx, Right Pilon fx S/P ORIF;   ? Referring Provider (PT) Haddix, Thomasene Lot, MD   ? Onset Date/Surgical Date 05/07/21   ? Hand Dominance Right   ? Next MD Visit 08/03/21   ? ?  ?  ? ?  ? ? ? ? ? ? ? ? ? ? ? ? ? ? ? ? [facility] Adult PT Treatment/Exercise - 08/09/21 0001   ? ?  ? Knee/Hip Exercises: Stretches  ?  Passive Hamstring Stretch Right;Left;30 seconds   ? Passive Hamstring Stretch Limitations with strap   ? Quad Stretch Right;Left;30 seconds   ? Quad Stretch Limitations with strap   ? Gastroc Stretch Right;Left;2 reps;30 seconds   ? Gastroc Stretch Limitations with strap   ?  ? Knee/Hip Exercises: Aerobic  ? Nustep L5 x 8 min UEs/LEs   ?  ? Knee/Hip Exercises: Seated  ? Long [employer] Strengthening;Right;Left;2 sets;10 reps   ? Long Arc Quad Weight 2 lbs.   ?  ? Knee/Hip Exercises: Supine  ? Straight Leg Raises Strengthening;Left;2 sets;10 reps   ?  ? Knee/Hip Exercises: Sidelying  ? Hip ABduction Strengthening;Right;Left;2 sets;10 reps   ? Hip ABduction Limitations 2 lbs   ?  ? Knee/Hip Exercises: Prone  ? Hamstring Curl 2 sets;10 reps   ? Hamstring Curl Limitations 2 lbs   ?  ? Manual Therapy  ? Manual therapy comments gentle grade II R ankle mobilization: talocrural  distraction, hindfoot inv/ev, tib/fib PA/AP, talocrural DF   ?  ? Ankle Exercises: Seated  ? Other Seated Ankle Exercises blue tband: PF and DF 3x10   ? ?  ?  ? ?  ? ? ? ? ? ? ? ? ? ? ? ? PT Short Term Goals - 07/28/21 1028   ? ?  ? PT SHORT TERM GOAL #1  ? Title Pt will be independent with initial HEP   ? Time 3   ? Period Weeks   ? Status Achieved   ? Target Date 07/30/21   ?  ? PT SHORT TERM GOAL #2  ? Title Pt will improve R ankle ROM to at least within 5 deg of neutral   ? Time 3   ? Period Weeks   ? Status Partially Met   ? Target Date 07/30/21   ?  ? PT SHORT TERM GOAL #3  ? Title Pt will be able to stand on L LE x5 min without use of knee brace   ? Baseline Able to hop on L LE with RW support   ? Time 3   ? Period Weeks   ? Status Achieved   ? Target Date 07/30/21   ?  ? PT SHORT TERM GOAL #4  ? Title Pt will be able to amb with RW x 150' maintaining weight bearing precautions (R LE NWB) to demo improved household mobility   ? Baseline Able to amb in/out of clinic >150' with RW R LE NWB   ? Time 3   ? Period Weeks   ? Status Achieved   ? Target Date 07/30/21   ? ?  ?  ? ?  ? ? ? ? PT Long Term Goals - 07/09/21 1600   ? ?  ? PT LONG TERM GOAL #1  ? Title Pt will be independent with final HEP   ? Time 12   ? Period Weeks   ? Status New   ? Target Date 10/01/21   ?  ? PT LONG TERM GOAL #2  ? Title Pt will be able to amb at least 1000' with LRAD for improved community mobility   ? Time 12   ? Period Weeks   ? Status New   ? Target Date 10/01/21   ?  ? PT LONG TERM GOAL #3  ? Title Pt will be able to perform 5x STS without UE support to demo improved functional strength   ? Time 12   ? Period  Weeks   ? Status New   ? Target Date 10/01/21   ?  ? PT LONG TERM GOAL #4  ? Title Pt will have R = L ankle ROM to be able to ascend/descend steps   ? Time 12   ? Period Weeks   ? Status New   ? Target Date 10/01/21   ?  ? PT LONG TERM GOAL #5  ? Title Pt will have improved FOTO score to predicted level   ? Time 12   ?  Period Weeks   ? Status New   ? Target Date 10/01/21   ? ?  ?  ? ?  ? ? ? ? ? ? ? ? Plan - 08/09/21 0954   ? ? Clinical Impression Statement Increased soreness today after her [place] activities -- mostly focused on ankle/knee ROM and manual work. Progressed ankle PF/DF to blue tband. Continues to have pain free crepitus with L knee LAQ in seated. Improved ability to perform SLR.   ? Personal Factors and Comorbidities Age;Fitness;Time since onset of injury/illness/exacerbation;Past/Current Experience   ? Examination-Activity Limitations Locomotion Level;Transfers;Stairs;Stand;Dressing;Hygiene/Grooming;Bend;Toileting;Lift   ? Examination-Participation Restrictions Cleaning;Community Activity;Driving;Occupation   ? Stability/Clinical Decision Making Evolving/Moderate complexity   ? Rehab Potential Good   ? PT Frequency 2x / week   1x/wk for first 3 weeks for Medicaid auth; transition to 2-3x/wk there after for 12 weeks total  ? PT Duration 12 weeks   ? PT Treatment/Interventions ADLs/Self Care Home Management;Aquatic Therapy;Iontophoresis 4mg /ml Dexamethasone;Moist Heat;DME Instruction;Cryotherapy;Electrical Stimulation;Gait training;Stair training;Functional mobility training;Therapeutic activities;Therapeutic exercise;Balance training;Neuromuscular re-education;Manual techniques;Patient/family education;Orthotic Fit/Training;Scar mobilization;Passive range of motion;Dry needling;Taping;Vasopneumatic Device;Joint Manipulations   ? PT Next Visit Plan Assess response to initial HEP. Continue R ankle ROM and strengthening, L knee ROM and strengthening.   ? PT Home Exercise Plan Access Code [email or web address]   ? Consulted and Agree with Plan of Care Patient;Family member/caregiver   ? ?  ?  ? ?  ? ? ?Patient will benefit from skilled therapeutic intervention in order to improve the following deficits and impairments:  Decreased range of motion, Difficulty walking, Increased fascial restricitons, Abnormal gait, Increased muscle  spasms, Decreased activity tolerance, Pain, Decreased balance, Hypomobility, Impaired flexibility, Improper body mechanics, Decreased mobility, Decreased strength, Increased edema, Impaired sensation ? ?Visit Diag

## 2021-08-11 ENCOUNTER — Ambulatory Visit: Payer: Medicaid Other | Admitting: Physical Therapy

## 2021-08-11 DIAGNOSIS — R262 Difficulty in walking, not elsewhere classified: Secondary | ICD-10-CM

## 2021-08-11 DIAGNOSIS — M6281 Muscle weakness (generalized): Secondary | ICD-10-CM

## 2021-08-11 DIAGNOSIS — M25671 Stiffness of right ankle, not elsewhere classified: Secondary | ICD-10-CM

## 2021-08-11 DIAGNOSIS — R600 Localized edema: Secondary | ICD-10-CM

## 2021-08-11 DIAGNOSIS — M25662 Stiffness of left knee, not elsewhere classified: Secondary | ICD-10-CM

## 2021-08-11 DIAGNOSIS — R2689 Other abnormalities of gait and mobility: Secondary | ICD-10-CM

## 2021-08-11 NOTE — Therapy (Signed)
[facility] ?[place] ?[place], [place], [address] ?Phone: [phone]   Fax:  [phone] ? ?Physical Therapy Treatment ? ?Patient Details  ?Name: Gina Martin ?MRN: [id number] ?Date of Birth: 03/06/1991 ?Referring Provider (PT): Haddix, Thomasene Lot, MD ? ? ?Encounter Date: 08/11/2021 ? ? PT End of Session - 08/11/21 0934   ? ? Visit Number 7   ? Number of Visits 16   ? Date for PT Re-Evaluation 10/29/21   ? Authorization Type Medicaid -- additional auth requested 07/28/21 for 12 visits   ? Authorization Time Period 12 visit request for 08/04/21 to 09/14/21   ? Authorization - Visit Number 6   ? Authorization - Number of Visits 12   ? PT Start Time [phone]   ? PT Stop Time 1015   ? PT Time Calculation (min) 41 min   ? Activity Tolerance Patient tolerated treatment well   ? Behavior During Therapy [facility] for tasks assessed/performed   ? ?  ?  ? ?  ? ? ?Past Medical History:  ?Diagnosis Date  ? Asthma   ? Bipolar 1 disorder ([place])   ? Hyperlipidemia   ? Preeclampsia   ? Schizophrenia ([place])   ? ? ?Past Surgical History:  ?Procedure Laterality Date  ? CESAREAN SECTION    ? EXTERNAL FIXATION LEG Right 05/07/2021  ? Procedure: EXTERNAL FIXATION AND IRRIGATION AND DEBRIDMENT  PILON;  Surgeon: Shona Needles, MD;  Location: [place];  Service: Orthopedics;  Laterality: Right;  ? EXTERNAL FIXATION REMOVAL Right 05/10/2021  ? Procedure: REMOVAL EXTERNAL FIXATION LEG;  Surgeon: Shona Needles, MD;  Location: [place];  Service: Orthopedics;  Laterality: Right;  ? OPEN REDUCTION INTERNAL FIXATION (ORIF) TIBIA/FIBULA FRACTURE Right 05/10/2021  ? Procedure: OPEN REDUCTION INTERNAL FIXATION (ORIF) TIBIA/FIBULA FRACTURE;  Surgeon: Shona Needles, MD;  Location: [place];  Service: Orthopedics;  Laterality: Right;  ? ORIF PATELLA Left 05/07/2021  ? Procedure: OPEN REDUCTION INTERNAL (ORIF) FIXATION PATELLA;  Surgeon: Shona Needles, MD;  Location: [place];  Service: Orthopedics;  Laterality: Left;   ? TUBAL LIGATION    ? ? ?There were no vitals filed for this visit. ? ? Subjective Assessment - 08/11/21 0936   ? ? Subjective Pt states her ankle was sore yesterday. It improved after doing her exercises and movement. States she was rubbing her ankle scar.   ? Patient is accompained by: Family member   ? Limitations Standing;Lifting;Walking;House hold activities   ? How long can you sit comfortably? n/a   ? How long can you stand comfortably? A few seconds   ? How long can you walk comfortably? ~66' with RW (can be limited to pain or fatigue)   ? Patient Stated Goals Walk and return to PLOF   ? Currently in Pain? No/denies   ? ?  ?  ? ?  ? ? ? ? ? [facility] PT Assessment - 08/11/21 0001   ? ?  ? Assessment  ? Medical Diagnosis Left Patella fx, Right Pilon fx S/P ORIF;   ? Referring Provider (PT) Haddix, Thomasene Lot, MD   ? Onset Date/Surgical Date 05/07/21   ? Hand Dominance Right   ? Next MD Visit 08/03/21   ? ?  ?  ? ?  ? ? ? ? ? ? ? ? ? ? ? ? ? ? ? ? [facility] Adult PT Treatment/Exercise - 08/11/21 0001   ? ?  ? Knee/Hip Exercises: Stretches  ? Gastroc Stretch  Right;2 reps;30 seconds   ? Soleus Stretch Right;30 seconds   ?  ? Knee/Hip Exercises: Aerobic  ? Recumbent Bike L2 x 5 min   ? Nustep L5 x 6 min UEs/LEs   ?  ? Knee/Hip Exercises: Machines for Strengthening  ? Total Gym Leg Press DL: 2x10 4 plates; SL 2x10 3 plates each leg   ? Other Machine on leg press: 3 plates heel raise [id number] knees extended and then with knees bent   ? ?  ?  ? ?  ? ? ? ? ? ? ? ? ? ? ? ? PT Short Term Goals - 07/28/21 1028   ? ?  ? PT SHORT TERM GOAL #1  ? Title Pt will be independent with initial HEP   ? Time 3   ? Period Weeks   ? Status Achieved   ? Target Date 07/30/21   ?  ? PT SHORT TERM GOAL #2  ? Title Pt will improve R ankle ROM to at least within 5 deg of neutral   ? Time 3   ? Period Weeks   ? Status Partially Met   ? Target Date 07/30/21   ?  ? PT SHORT TERM GOAL #3  ? Title Pt will be able to stand on L LE x5 min without use of knee  brace   ? Baseline Able to hop on L LE with RW support   ? Time 3   ? Period Weeks   ? Status Achieved   ? Target Date 07/30/21   ?  ? PT SHORT TERM GOAL #4  ? Title Pt will be able to amb with RW x 150' maintaining weight bearing precautions (R LE NWB) to demo improved household mobility   ? Baseline Able to amb in/out of clinic >150' with RW R LE NWB   ? Time 3   ? Period Weeks   ? Status Achieved   ? Target Date 07/30/21   ? ?  ?  ? ?  ? ? ? ? PT Long Term Goals - 07/09/21 1600   ? ?  ? PT LONG TERM GOAL #1  ? Title Pt will be independent with final HEP   ? Time 12   ? Period Weeks   ? Status New   ? Target Date 10/01/21   ?  ? PT LONG TERM GOAL #2  ? Title Pt will be able to amb at least 1000' with LRAD for improved community mobility   ? Time 12   ? Period Weeks   ? Status New   ? Target Date 10/01/21   ?  ? PT LONG TERM GOAL #3  ? Title Pt will be able to perform 5x STS without UE support to demo improved functional strength   ? Time 12   ? Period Weeks   ? Status New   ? Target Date 10/01/21   ?  ? PT LONG TERM GOAL #4  ? Title Pt will have R = L ankle ROM to be able to ascend/descend steps   ? Time 12   ? Period Weeks   ? Status New   ? Target Date 10/01/21   ?  ? PT LONG TERM GOAL #5  ? Title Pt will have improved FOTO score to predicted level   ? Time 12   ? Period Weeks   ? Status New   ? Target Date 10/01/21   ? ?  ?  ? ?  ? ? ? ? ? ? ? ?  Plan - 08/11/21 0950   ? ? Clinical Impression Statement Initiated machine strengthening this session with good pt tolerance. Crepitus free in L knee during closed chain exercises.   ? Personal Factors and Comorbidities Age;Fitness;Time since onset of injury/illness/exacerbation;Past/Current Experience   ? Examination-Activity Limitations Locomotion Level;Transfers;Stairs;Stand;Dressing;Hygiene/Grooming;Bend;Toileting;Lift   ? Examination-Participation Restrictions Cleaning;Community Activity;Driving;Occupation   ? Stability/Clinical Decision Making Evolving/Moderate  complexity   ? Rehab Potential Good   ? PT Frequency 2x / week   1x/wk for first 3 weeks for Medicaid auth; transition to 2-3x/wk there after for 12 weeks total  ? PT Duration 12 weeks   ? PT Treatment/Interventions ADLs/Self Care Home Management;Aquatic Therapy;Iontophoresis 71m/ml Dexamethasone;Moist Heat;DME Instruction;Cryotherapy;Electrical Stimulation;Gait training;Stair training;Functional mobility training;Therapeutic activities;Therapeutic exercise;Balance training;Neuromuscular re-education;Manual techniques;Patient/family education;Orthotic Fit/Training;Scar mobilization;Passive range of motion;Dry needling;Taping;Vasopneumatic Device;Joint Manipulations   ? PT Next Visit Plan Assess response to initial HEP. Continue R ankle ROM and strengthening, L knee ROM and strengthening.   ? PT Home Exercise Plan Access Code [email or web address]  ? Consulted and Agree with Plan of Care Patient;Family member/caregiver   ? ?  ?  ? ?  ? ? ?Patient will benefit from skilled therapeutic intervention in order to improve the following deficits and impairments:  Decreased range of motion, Difficulty walking, Increased fascial restricitons, Abnormal gait, Increased muscle spasms, Decreased activity tolerance, Pain, Decreased balance, Hypomobility, Impaired flexibility, Improper body mechanics, Decreased mobility, Decreased strength, Increased edema, Impaired sensation ? ?Visit Diagnosis: ?Muscle weakness (generalized) ? ?Difficulty in walking, not elsewhere classified ? ?Localized edema ? ?Other abnormalities of gait and mobility ? ?Stiffness of right ankle, not elsewhere classified ? ?Stiffness of left knee, not elsewhere classified ? ? ? ? ?Problem List ?Patient Active Problem List  ? Diagnosis Date Noted  ? Left patella fracture 05/10/2021  ? Closed fracture of lateral portion of right tibial plateau 05/10/2021  ? Open pilon fracture, right, type III, initial encounter 05/07/2021  ? ? ?Gina Martin April MGordy Levan PT, DPT ?08/11/2021,  10:06 AM ? ?[facility] ?[address]?[place] [address]?Phone: [phone]  Fax:  [phone]? ?Name: SElexis Martin?MRN: [id number]

## 2021-08-16 ENCOUNTER — Ambulatory Visit: Payer: Medicaid Other | Admitting: Physical Therapy

## 2021-08-18 ENCOUNTER — Encounter: Payer: Self-pay | Admitting: Physical Therapy

## 2021-08-20 ENCOUNTER — Ambulatory Visit (HOSPITAL_BASED_OUTPATIENT_CLINIC_OR_DEPARTMENT_OTHER): Payer: Medicaid Other | Attending: Student | Admitting: Physical Therapy

## 2021-08-20 ENCOUNTER — Encounter (HOSPITAL_BASED_OUTPATIENT_CLINIC_OR_DEPARTMENT_OTHER): Payer: Self-pay | Admitting: Physical Therapy

## 2021-08-20 DIAGNOSIS — R600 Localized edema: Secondary | ICD-10-CM | POA: Insufficient documentation

## 2021-08-20 DIAGNOSIS — M6281 Muscle weakness (generalized): Secondary | ICD-10-CM | POA: Insufficient documentation

## 2021-08-20 DIAGNOSIS — R262 Difficulty in walking, not elsewhere classified: Secondary | ICD-10-CM | POA: Insufficient documentation

## 2021-08-20 DIAGNOSIS — R2689 Other abnormalities of gait and mobility: Secondary | ICD-10-CM | POA: Diagnosis not present

## 2021-08-20 DIAGNOSIS — M25671 Stiffness of right ankle, not elsewhere classified: Secondary | ICD-10-CM

## 2021-08-20 NOTE — Therapy (Signed)
[facility] ?[place], [place], [address] ?Phone: [phone]   Fax:  [phone] ? ?Physical Therapy Treatment ? ?Patient Details  ?Name: Gina Martin ?MRN: [id number] ?Date of Birth: 07-22-1990 ?Referring Provider (PT): Haddix, Thomasene Lot, MD ? ? ?Encounter Date: 08/20/2021 ? ? PT End of Session - 08/20/21 0854   ? ? Visit Number 8   ? Number of Visits 16   ? Date for PT Re-Evaluation 10/29/21   ? Authorization Type Medicaid -- additional auth requested 07/28/21 for 12 visits   ? PT Start Time [phone]   pt arrived late  ? PT Stop Time 0931   ? PT Time Calculation (min) 40 min   ? Activity Tolerance Patient tolerated treatment well   ? Behavior During Therapy [facility] for tasks assessed/performed   ? ?  ?  ? ?  ? ? ?Past Medical History:  ?Diagnosis Date  ? Asthma   ? Bipolar 1 disorder ([place])   ? Hyperlipidemia   ? Preeclampsia   ? Schizophrenia ([place])   ? ? ?Past Surgical History:  ?Procedure Laterality Date  ? CESAREAN SECTION    ? EXTERNAL FIXATION LEG Right 05/07/2021  ? Procedure: EXTERNAL FIXATION AND IRRIGATION AND DEBRIDMENT  PILON;  Surgeon: Shona Needles, MD;  Location: [place];  Service: Orthopedics;  Laterality: Right;  ? EXTERNAL FIXATION REMOVAL Right 05/10/2021  ? Procedure: REMOVAL EXTERNAL FIXATION LEG;  Surgeon: Shona Needles, MD;  Location: [place];  Service: Orthopedics;  Laterality: Right;  ? OPEN REDUCTION INTERNAL FIXATION (ORIF) TIBIA/FIBULA FRACTURE Right 05/10/2021  ? Procedure: OPEN REDUCTION INTERNAL FIXATION (ORIF) TIBIA/FIBULA FRACTURE;  Surgeon: Shona Needles, MD;  Location: [place];  Service: Orthopedics;  Laterality: Right;  ? ORIF PATELLA Left 05/07/2021  ? Procedure: OPEN REDUCTION INTERNAL (ORIF) FIXATION PATELLA;  Surgeon: Shona Needles, MD;  Location: [place];  Service: Orthopedics;  Laterality: Left;  ? TUBAL LIGATION    ? ? ?There were no vitals filed for this visit. ? ? Subjective Assessment - 08/20/21 1355   ? ? Subjective Pt  reports she has been working hard in therapy; was a little sore after last session.  Her partner (father of her children) is present at pool side during treatment.  She arrives in wheel chair and uses it as RW to get to pool stairs.   ? Patient is accompained by: Family member   ? Limitations Standing;Lifting;Walking;House hold activities   ? How long can you sit comfortably? n/a   ? How long can you stand comfortably? A few seconds   ? How long can you walk comfortably? ~2' with RW (can be limited to pain or fatigue)   ? Patient Stated Goals Walk and return to PLOF   ? Currently in Pain? No/denies   ? ?  ?  ? ?  ? ? ?Pt seen for aquatic therapy today.  Treatment took place in water 3.25-4 ft in depth at the [employer] pool. Temp of water was 91?.  Pt entered/exited the pool via stairs with supervision with bilat rail. ? ?Gait in ~23ft water with water walker, and therapist close SBA for safety and confidence:  forward/  backward / side stepping ?Cues for increased heel strike, roll through of foot, even weight shift and step length.  Multiple laps at slow, deliberate pace.  ? ?Holding onto wall:   ?Heel raises x 10 ?Squats x 10 ?In squat position - alternating heel raises x 10, working  on proper foot position (not everting foot) ?Weight shifts onto single leg ? ?Seated on bench in water:  LAQ with DF x 10 each leg, alternating ?Sit to/from stand x 10 ? ?Pt requires buoyancy for support and to offload joints with strengthening exercises. Viscosity of the water is needed for resistance of strengthening; water current perturbations provides challenge to standing balance unsupported, requiring increased core activation. ? ? ? ? PT Short Term Goals - 07/28/21 1028   ? ?  ? PT SHORT TERM GOAL #1  ? Title Pt will be independent with initial HEP   ? Time 3   ? Period Weeks   ? Status Achieved   ? Target Date 07/30/21   ?  ? PT SHORT TERM GOAL #2  ? Title Pt will improve R ankle ROM to at least within 5 deg of  neutral   ? Time 3   ? Period Weeks   ? Status Partially Met   ? Target Date 07/30/21   ?  ? PT SHORT TERM GOAL #3  ? Title Pt will be able to stand on L LE x5 min without use of knee brace   ? Baseline Able to hop on L LE with RW support   ? Time 3   ? Period Weeks   ? Status Achieved   ? Target Date 07/30/21   ?  ? PT SHORT TERM GOAL #4  ? Title Pt will be able to amb with RW x 150' maintaining weight bearing precautions (R LE NWB) to demo improved household mobility   ? Baseline Able to amb in/out of clinic >150' with RW R LE NWB   ? Time 3   ? Period Weeks   ? Status Achieved   ? Target Date 07/30/21   ? ?  ?  ? ?  ? ? ? ? PT Long Term Goals - 07/09/21 1600   ? ?  ? PT LONG TERM GOAL #1  ? Title Pt will be independent with final HEP   ? Time 12   ? Period Weeks   ? Status New   ? Target Date 10/01/21   ?  ? PT LONG TERM GOAL #2  ? Title Pt will be able to amb at least 1000' with LRAD for improved community mobility   ? Time 12   ? Period Weeks   ? Status New   ? Target Date 10/01/21   ?  ? PT LONG TERM GOAL #3  ? Title Pt will be able to perform 5x STS without UE support to demo improved functional strength   ? Time 12   ? Period Weeks   ? Status New   ? Target Date 10/01/21   ?  ? PT LONG TERM GOAL #4  ? Title Pt will have R = L ankle ROM to be able to ascend/descend steps   ? Time 12   ? Period Weeks   ? Status New   ? Target Date 10/01/21   ?  ? PT LONG TERM GOAL #5  ? Title Pt will have improved FOTO score to predicted level   ? Time 12   ? Period Weeks   ? Status New   ? Target Date 10/01/21   ? ?  ?  ? ?  ? ? ? ? ? ? ? ? Plan - 08/20/21 1420   ? ? Clinical Impression Statement Pt responded well with gait in chest deep water with use of  water walker for UE support.  She required cues for step length, increased heel strike and posture.  with increased time and cues, gait quality improved.  Ankle AROM continues to be limited.  Pt requires therapist in the water for safety and improved confidence; pt reports  she cannot swim but is excited to do exercises in the water.  She will need work on forward step ups with LLE to engage more quad/hamstring.  She tolerated all exercises without increase in pain.  Pt progressing towards goals.   ? Rehab Potential Good   ? PT Frequency 2x / week   ? PT Duration 12 weeks   ? PT Treatment/Interventions ADLs/Self Care Home Management;Aquatic Therapy;Iontophoresis 4mg /ml Dexamethasone;Moist Heat;DME Instruction;Cryotherapy;Electrical Stimulation;Gait training;Stair training;Functional mobility training;Therapeutic activities;Therapeutic exercise;Balance training;Neuromuscular re-education;Manual techniques;Patient/family education;Orthotic Fit/Training;Scar mobilization;Passive range of motion;Dry needling;Taping;Vasopneumatic Device;Joint Manipulations   ? PT Next Visit Plan Assess response to aquatics. Continue R ankle ROM and strengthening, L knee ROM and strengthening.   ? PT Home Exercise Plan Access Code [email or web address]   ? ?  ?  ? ?  ? ? ?Patient will benefit from skilled therapeutic intervention in order to improve the following deficits and impairments:  Decreased range of motion, Difficulty walking, Increased fascial restricitons, Abnormal gait, Increased muscle spasms, Decreased activity tolerance, Pain, Decreased balance, Hypomobility, Impaired flexibility, Improper body mechanics, Decreased mobility, Decreased strength, Increased edema, Impaired sensation ? ?Visit Diagnosis: ?Muscle weakness (generalized) ? ?Difficulty in walking, not elsewhere classified ? ?Localized edema ? ?Other abnormalities of gait and mobility ? ?Stiffness of right ankle, not elsewhere classified ? ? ? ? ?Problem List ?Patient Active Problem List  ? Diagnosis Date Noted  ? Left patella fracture 05/10/2021  ? Closed fracture of lateral portion of right tibial plateau 05/10/2021  ? Open pilon fracture, right, type III, initial encounter 05/07/2021  ? ?Kerin Perna, PTA ?08/20/21 4:43 PM ? ? ?[facility] ?[address] ?[address] ?[place], [place], [address] ?Phone: [phone]   Fax:  [phone] ? ?Name: Gina Martin ?MRN: [id number] ?Date of Birth: May 13, 1990

## 2021-08-27 ENCOUNTER — Ambulatory Visit (HOSPITAL_BASED_OUTPATIENT_CLINIC_OR_DEPARTMENT_OTHER): Payer: Medicaid Other | Admitting: Physical Therapy

## 2021-08-31 ENCOUNTER — Ambulatory Visit (HOSPITAL_BASED_OUTPATIENT_CLINIC_OR_DEPARTMENT_OTHER): Payer: Medicaid Other | Attending: Student | Admitting: Physical Therapy

## 2021-08-31 DIAGNOSIS — R600 Localized edema: Secondary | ICD-10-CM | POA: Insufficient documentation

## 2021-08-31 DIAGNOSIS — M6281 Muscle weakness (generalized): Secondary | ICD-10-CM | POA: Insufficient documentation

## 2021-08-31 DIAGNOSIS — R2689 Other abnormalities of gait and mobility: Secondary | ICD-10-CM | POA: Diagnosis present

## 2021-08-31 DIAGNOSIS — R262 Difficulty in walking, not elsewhere classified: Secondary | ICD-10-CM | POA: Diagnosis present

## 2021-08-31 DIAGNOSIS — M25671 Stiffness of right ankle, not elsewhere classified: Secondary | ICD-10-CM | POA: Diagnosis present

## 2021-08-31 NOTE — Therapy (Addendum)
[place] ?[place] ?[address] ?[place], [place], [address] ?Phone: [phone]   Fax:  [phone] ? ?Physical Therapy Treatment ? ?Patient Details  ?Name: Gina Martin ?MRN: [id number] ?Date of Birth: [birth year] ?Referring Provider (PT): Haddix, Thomasene Lot, MD ? ? ?Encounter Date: 08/31/2021 ? ? PT End of Session - 08/31/21 0844   ? ? Visit Number 9   ? Number of Visits 16   ? Date for PT Re-Evaluation 10/29/21   ? Authorization Type Medicaid -- additional auth requested 07/28/21 for 12 visits   ? Authorization Time Period 12 visit request for 08/04/21 to 09/14/21   ? Authorization - Visit Number 9   ? Authorization - Number of Visits 12   ? PT Start Time [phone]   ? PT Stop Time 0930   ? PT Time Calculation (min) 46 min   ? Activity Tolerance Patient tolerated treatment well   ? Behavior During Therapy [facility] for tasks assessed/performed   ? ?  ?  ? ?  ? ? ?Past Medical History:  ?Diagnosis Date  ? Asthma   ? Bipolar 1 disorder ([place])   ? Hyperlipidemia   ? Preeclampsia   ? Schizophrenia ([place])   ? ? ?Past Surgical History:  ?Procedure Laterality Date  ? CESAREAN SECTION    ? EXTERNAL FIXATION LEG Right 05/07/2021  ? Procedure: EXTERNAL FIXATION AND IRRIGATION AND DEBRIDMENT  PILON;  Surgeon: Shona Needles, MD;  Location: [place];  Service: Orthopedics;  Laterality: Right;  ? EXTERNAL FIXATION REMOVAL Right 05/10/2021  ? Procedure: REMOVAL EXTERNAL FIXATION LEG;  Surgeon: Shona Needles, MD;  Location: [place];  Service: Orthopedics;  Laterality: Right;  ? OPEN REDUCTION INTERNAL FIXATION (ORIF) TIBIA/FIBULA FRACTURE Right 05/10/2021  ? Procedure: OPEN REDUCTION INTERNAL FIXATION (ORIF) TIBIA/FIBULA FRACTURE;  Surgeon: Shona Needles, MD;  Location: [place];  Service: Orthopedics;  Laterality: Right;  ? ORIF PATELLA Left 05/07/2021  ? Procedure: OPEN REDUCTION INTERNAL (ORIF) FIXATION PATELLA;  Surgeon: Shona Needles, MD;  Location: [place];  Service: Orthopedics;  Laterality: Left;  ?  TUBAL LIGATION    ? ? ?There were no vitals filed for this visit. ? ? Subjective Assessment - 08/31/21 0951   ? ? Subjective Pt arrives via [facility] with her partner; he is present at pool side during treatment.  He reports she was "a good sore" the days following last session.  She reports that she is still using her bedside commode and shower chair.  She has not been compliant with HEP with band for ankle.  She reports that she has been trying to walk around house holding onto wall instead of using RW.   ? Patient is accompained by: Family member   ? Limitations Standing;Lifting;Walking;House hold activities   ? How long can you sit comfortably? n/a   ? How long can you stand comfortably? A few seconds   ? How long can you walk comfortably? ~76' with RW (can be limited to pain or fatigue)   ? Patient Stated Goals Walk and return to PLOF   ? Currently in Pain? Yes   ? Pain Score 3    ? Pain Location Ankle   ? Pain Orientation Right   ? Pain Descriptors / Indicators Aching;Dull;Tightness   ? Pain Relieving Factors elevating LE   ? ?  ?  ? ?  ? ? ?  ?Pt seen for aquatic therapy today.  Treatment took place in water 3.25-4.25 ft in depth at  the [employer] pool. Temp of water was 91?.  Pt entered/exited the pool via stairs with supervision with bilat rail. ?  ?Gait in ~8f water with yellow noodle and therapist SBA for improved confidence:  forward/  backward / side stepping ?Cues for increased heel strike, roll through of foot, even weight shift and step length.  Multiple laps at slow, deliberate pace; then with increased speed.  ?  ?Holding onto wall:   ?Heel / toe raises x 10 ?Squats x 10 ?Hip ext x 10 each ?Forward kick (cues for kicking a ball) x 10 each ?Hip abdct x 10 each (small range, quick speed) ? ?with hands on yellow noodle: ?Tandem stance x 15 sec each ?SLS x 10 sec each, 2  ?Tandem gait forward / backward  ? ?Without support: ?Forward step ups with RLE on step in 4.25 ft water x 12  ?  ?Pt requires  buoyancy for support and to offload joints with strengthening exercises. Viscosity of the water is needed for resistance of strengthening; water current perturbations provides challenge to standing balance unsupported, requiring increased core activation. ?  ? ? ? PT Short Term Goals - 07/28/21 1028   ? ?  ? PT SHORT TERM GOAL #1  ? Title Pt will be independent with initial HEP   ? Time 3   ? Period Weeks   ? Status Achieved   ? Target Date 07/30/21   ?  ? PT SHORT TERM GOAL #2  ? Title Pt will improve R ankle ROM to at least within 5 deg of neutral   ? Time 3   ? Period Weeks   ? Status Partially Met   ? Target Date 07/30/21   ?  ? PT SHORT TERM GOAL #3  ? Title Pt will be able to stand on L LE x5 min without use of knee brace   ? Baseline Able to hop on L LE with RW support   ? Time 3   ? Period Weeks   ? Status Achieved   ? Target Date 07/30/21   ?  ? PT SHORT TERM GOAL #4  ? Title Pt will be able to amb with RW x 150' maintaining weight bearing precautions (R LE NWB) to demo improved household mobility   ? Baseline Able to amb in/out of clinic >150' with RW R LE NWB   ? Time 3   ? Period Weeks   ? Status Achieved   ? Target Date 07/30/21   ? ?  ?  ? ?  ? ? ? ? PT Long Term Goals - 07/09/21 1600   ? ?  ? PT LONG TERM GOAL #1  ? Title Pt will be independent with final HEP   ? Time 12   ? Period Weeks   ? Status New   ? Target Date 10/01/21   ?  ? PT LONG TERM GOAL #2  ? Title Pt will be able to amb at least 1000' with LRAD for improved community mobility   ? Time 12   ? Period Weeks   ? Status New   ? Target Date 10/01/21   ?  ? PT LONG TERM GOAL #3  ? Title Pt will be able to perform 5x STS without UE support to demo improved functional strength   ? Time 12   ? Period Weeks   ? Status New   ? Target Date 10/01/21   ?  ? PT LONG TERM GOAL #4  ?  Title Pt will have R = L ankle ROM to be able to ascend/descend steps   ? Time 12   ? Period Weeks   ? Status New   ? Target Date 10/01/21   ?  ? PT LONG TERM GOAL #5  ?  Title Pt will have improved FOTO score to predicted level   ? Time 12   ? Period Weeks   ? Status New   ? Target Date 10/01/21   ? ?  ?  ? ?  ? ? ? ? ? ? ? ? Plan - 08/31/21 0954   ? ? Clinical Impression Statement Pt was able to work in chest deep water with less support from therapist; able to hold noodle with SBA without LOB and with increased confidence this visit.  She requires some cues for Rt foot positioning (avoiding inversion).  She would benefit from further work on gait and stairs leading with RLE.  Encouraged pt to d/c bed side commode and utilize bathroom toilet; do some standing in shower with assistance of partner; return to compliance of HEP for Rt ankle strengthening with bands; consider getting SPC for progressing gait household distances safely; and to use RLE to ascend steps with UE support.  Progressing towards goals.   ? Rehab Potential Good   ? PT Frequency 2x / week   ? PT Duration 12 weeks   ? PT Treatment/Interventions ADLs/Self Care Home Management;Aquatic Therapy;Iontophoresis 78m/ml Dexamethasone;Moist Heat;DME Instruction;Cryotherapy;Electrical Stimulation;Gait training;Stair training;Functional mobility training;Therapeutic activities;Therapeutic exercise;Balance training;Neuromuscular re-education;Manual techniques;Patient/family education;Orthotic Fit/Training;Scar mobilization;Passive range of motion;Dry needling;Taping;Vasopneumatic Device;Joint Manipulations   ? PT Next Visit Plan Continue aquatics. Continue R ankle ROM and strengthening, L knee ROM and strengthening.   ? PT Home Exercise Plan Access Code [email or web address]  ? Consulted and Agree with Plan of Care Patient;Family member/caregiver   ? ?  ?  ? ?  ? ?Addendum 08/31/21:  After therapy appointment, I called patient at number listed in chart.  I requested she follow up with Dr. HDoreatha Martinand get clarification on her WB status.  She stated she was at follow up appointment with Dr. HDoreatha Martinat that moment and she then placed me on  speaker phone.  Per Dr. HDoreatha Martin pt's Rt ankle has some delayed healing.  [facility] she is to listen to her body and not push too hard.  He is ordering a bone stimulator for her use.   ?Jennife

## 2021-09-08 ENCOUNTER — Ambulatory Visit: Payer: Medicaid Other | Attending: Student | Admitting: Physical Therapy

## 2021-09-08 DIAGNOSIS — M25671 Stiffness of right ankle, not elsewhere classified: Secondary | ICD-10-CM | POA: Insufficient documentation

## 2021-09-08 DIAGNOSIS — R2689 Other abnormalities of gait and mobility: Secondary | ICD-10-CM | POA: Insufficient documentation

## 2021-09-08 DIAGNOSIS — M6281 Muscle weakness (generalized): Secondary | ICD-10-CM | POA: Insufficient documentation

## 2021-09-08 DIAGNOSIS — R262 Difficulty in walking, not elsewhere classified: Secondary | ICD-10-CM | POA: Insufficient documentation

## 2021-09-08 DIAGNOSIS — R600 Localized edema: Secondary | ICD-10-CM | POA: Insufficient documentation

## 2021-09-08 DIAGNOSIS — M25662 Stiffness of left knee, not elsewhere classified: Secondary | ICD-10-CM | POA: Insufficient documentation

## 2021-09-08 NOTE — Therapy (Signed)
[place] ?[facility] ?[address] ?[place], [place], [address] ?Phone: [phone]   Fax:  [phone] ? ?Physical Therapy Treatment ? ?Patient Details  ?Name: Gina Martin ?MRN: [id number] ?Date of Birth: 10/29/1990 ?Referring Provider (PT): Haddix, Thomasene Lot, MD ? ? ?Encounter Date: 09/08/2021 ? ? PT End of Session - 09/08/21 1014   ? ? Visit Number 10   ? Number of Visits 24   ? Date for PT Re-Evaluation 10/29/21   ? Authorization Type Medicaid -- additional auth requested 07/28/21 for 12 visits; auth requested 09/08/21 for 12 more visits   ? Authorization Time Period 12 visit 08/04/21 to 09/14/21   ? Authorization - Visit Number 10   ? Authorization - Number of Visits 12   ? PT Start Time 1015   ? PT Stop Time 1055   ? PT Time Calculation (min) 40 min   ? Activity Tolerance Patient tolerated treatment well   ? Behavior During Therapy [facility] for tasks assessed/performed   ? ?  ?  ? ?  ? ? ?Past Medical History:  ?Diagnosis Date  ? Asthma   ? Bipolar 1 disorder ([place])   ? Hyperlipidemia   ? Preeclampsia   ? Schizophrenia ([place])   ? ? ?Past Surgical History:  ?Procedure Laterality Date  ? CESAREAN SECTION    ? EXTERNAL FIXATION LEG Right 05/07/2021  ? Procedure: EXTERNAL FIXATION AND IRRIGATION AND DEBRIDMENT  PILON;  Surgeon: Shona Needles, MD;  Location: [place];  Service: Orthopedics;  Laterality: Right;  ? EXTERNAL FIXATION REMOVAL Right 05/10/2021  ? Procedure: REMOVAL EXTERNAL FIXATION LEG;  Surgeon: Shona Needles, MD;  Location: [place];  Service: Orthopedics;  Laterality: Right;  ? OPEN REDUCTION INTERNAL FIXATION (ORIF) TIBIA/FIBULA FRACTURE Right 05/10/2021  ? Procedure: OPEN REDUCTION INTERNAL FIXATION (ORIF) TIBIA/FIBULA FRACTURE;  Surgeon: Shona Needles, MD;  Location: [place];  Service: Orthopedics;  Laterality: Right;  ? ORIF PATELLA Left 05/07/2021  ? Procedure: OPEN REDUCTION INTERNAL (ORIF) FIXATION PATELLA;  Surgeon: Shona Needles, MD;  Location: [place];  Service:  Orthopedics;  Laterality: Left;  ? TUBAL LIGATION    ? ? ?There were no vitals filed for this visit. ? ? Subjective Assessment - 09/08/21 1017   ? ? Subjective Pt states she is now cleared for WBAT on R LE. Pt has been trying to do more standing at home. Pt states that she's been trying to do exercises. Just came back from the beach. Pt has been weaned to cane. Pt reports she still has one little piece of bone that is still not fully healed on R LE. Will be getting bone stimulator.   ? Patient is accompained by: Family member   ? Limitations Standing;Lifting;Walking;House hold activities   ? How long can you sit comfortably? n/a   ? How long can you stand comfortably? A few seconds   ? How long can you walk comfortably? ~38' with RW (can be limited to pain or fatigue)   ? Patient Stated Goals Walk and return to PLOF   ? ?  ?  ? ?  ? ? ? ? ? [facility] PT Assessment - 09/08/21 0001   ? ?  ? Assessment  ? Medical Diagnosis Left [place] fx, Right [place] fx S/P ORIF;   ? Referring Provider (PT) Haddix, Thomasene Lot, MD   ? Onset Date/Surgical Date 05/07/21   ? Hand Dominance Right   ? Next MD Visit 08/03/21   ?  ?  Ambulation/Gait  ? Ambulation Distance (Feet) --   throughout gym  ? Assistive device Straight cane   ? Gait Pattern Step-through pattern;Decreased weight shift to right;Antalgic   keeps R LE abducted away from L; trunk/weight over L LE  ? Ambulation Surface Level;Indoor   ? ?  ?  ? ?  ? ? ? ? ? ? ? ? ? ? ? ? ? ? ? ? [facility] Adult PT Treatment/Exercise - 09/08/21 0001   ? ?  ? Knee/Hip Exercises: Stretches  ? Gastroc Stretch Right;2 reps;30 seconds   ? Soleus Stretch Right;30 seconds   ?  ? Knee/Hip Exercises: Aerobic  ? Recumbent Bike L3 x 5 min   ?  ? Knee/Hip Exercises: Machines for Strengthening  ? Total Gym Leg Press DL: 2x10 6 plates; SL 2x10 4 plates each leg   ?  ? Knee/Hip Exercises: Standing  ? Heel Raises Both;2 sets;10 reps   ? Heel Raises Limitations heel/toe raises   ? Hip Abduction Stengthening;Right;Left;2  sets;10 reps;Knee straight   ? Abduction Limitations red tband   ? Hip Extension Stengthening;Right;Left;2 sets;10 reps;Knee straight   ? Extension Limitations red tband   ? Wall Squat 2 sets;10 reps   ? Other Standing Knee Exercises Tandem stance 2x30 sec   ? ?  ?  ? ?  ? ? ? ? ? ? ? ? ? ? ? ? PT Short Term Goals - 07/28/21 1028   ? ?  ? PT SHORT TERM GOAL #1  ? Title Pt will be independent with initial HEP   ? Time 3   ? Period Weeks   ? Status Achieved   ? Target Date 07/30/21   ?  ? PT SHORT TERM GOAL #2  ? Title Pt will improve R ankle ROM to at least within 5 deg of neutral   ? Time 3   ? Period Weeks   ? Status Partially Met   ? Target Date 07/30/21   ?  ? PT SHORT TERM GOAL #3  ? Title Pt will be able to stand on L LE x5 min without use of knee brace   ? Baseline Able to hop on L LE with RW support   ? Time 3   ? Period Weeks   ? Status Achieved   ? Target Date 07/30/21   ?  ? PT SHORT TERM GOAL #4  ? Title Pt will be able to amb with RW x 150' maintaining weight bearing precautions (R LE NWB) to demo improved household mobility   ? Baseline Able to amb in/out of clinic >150' with RW R LE NWB   ? Time 3   ? Period Weeks   ? Status Achieved   ? Target Date 07/30/21   ? ?  ?  ? ?  ? ? ? ? PT Long Term Goals - 09/08/21 1045   ? ?  ? PT LONG TERM GOAL #1  ? Title Pt will be independent with final HEP   ? Time 12   ? Period Weeks   ? Status On-going   ? Target Date 10/20/21   ?  ? PT LONG TERM GOAL #2  ? Title Pt will be able to amb at least 1000' with LRAD for improved community mobility   ? Baseline SPC household distances only   ? Time 12   ? Period Weeks   ? Status On-going   ? Target Date 10/20/21   ?  ? PT  LONG TERM GOAL #3  ? Title Pt will be able to perform 5x STS without UE support to demo improved functional strength   ? Baseline 30 sec with use of UEs   ? Time 12   ? Period Weeks   ? Status On-going   ? Target Date 10/20/21   ?  ? PT LONG TERM GOAL #4  ? Title Pt will have R = L ankle ROM to be able to  ascend/descend steps   ? Baseline R dorsiflexion remains 5 deg less than L   ? Time 12   ? Period Weeks   ? Status On-going   ? Target Date 10/20/21   ?  ? PT LONG TERM GOAL #5  ? Title Pt will have improved FOTO score to predicted level   ? Time 12   ? Period Weeks   ? Status Deferred   ? Target Date 10/20/21   ? ?  ?  ? ?  ? ? ? ? ? ? ? ? Plan - 09/08/21 1032   ? ? Clinical Impression Statement Re-cert for insurance auth. [place]. Pt is now WBAT on R LE. Updated HEP to advance her into standing. Good pt tolerance. Improving strength overall--now able to push 6 plates on leg press vs 4 plates. Decreased weight shift noted to R LE. She continues to make gains towards her LTGs. Pt would benefit from continued PT to address her continued deficits and strength.   ? Personal Factors and Comorbidities Age;Fitness;Time since onset of injury/illness/exacerbation;Past/Current Experience   ? Examination-Activity Limitations Locomotion Level;Transfers;Stairs;Stand;Dressing;Hygiene/Grooming;Bend;Toileting;Lift   ? Examination-Participation Restrictions Cleaning;Community Activity;Driving;Occupation   ? Rehab Potential Good   ? PT Frequency 2x / week   ? PT Duration 12 weeks   ? PT Treatment/Interventions ADLs/Self Care Home Management;Aquatic Therapy;Iontophoresis 4mg /ml Dexamethasone;Moist Heat;DME Instruction;Cryotherapy;Electrical Stimulation;Gait training;Stair training;Functional mobility training;Therapeutic activities;Therapeutic exercise;Balance training;Neuromuscular re-education;Manual techniques;Patient/family education;Orthotic Fit/Training;Scar mobilization;Passive range of motion;Dry needling;Taping;Vasopneumatic Device;Joint Manipulations   ? PT Next Visit Plan Continue aquatics. Continue R ankle ROM and strengthening, L knee ROM and strengthening.   ? PT Home Exercise Plan Access Code [email or web address]   ? Consulted and Agree with Plan of Care Patient;Family member/caregiver   ? ?  ?   ? ?  ? ? ?Patient will benefit from skilled therapeutic intervention in order to improve the following deficits and impairments:  Decreased range of motion, Difficulty walking, Increased fascial restric

## 2021-09-20 ENCOUNTER — Ambulatory Visit (HOSPITAL_BASED_OUTPATIENT_CLINIC_OR_DEPARTMENT_OTHER): Payer: Self-pay | Admitting: Physical Therapy

## 2021-09-21 ENCOUNTER — Ambulatory Visit: Payer: Medicaid Other | Admitting: Physical Therapy

## 2021-09-28 ENCOUNTER — Ambulatory Visit: Payer: Medicaid Other | Admitting: Physical Therapy

## 2021-09-28 DIAGNOSIS — M6281 Muscle weakness (generalized): Secondary | ICD-10-CM | POA: Diagnosis not present

## 2021-09-28 DIAGNOSIS — M25662 Stiffness of left knee, not elsewhere classified: Secondary | ICD-10-CM

## 2021-09-28 DIAGNOSIS — R262 Difficulty in walking, not elsewhere classified: Secondary | ICD-10-CM

## 2021-09-28 DIAGNOSIS — R600 Localized edema: Secondary | ICD-10-CM

## 2021-09-28 DIAGNOSIS — M25671 Stiffness of right ankle, not elsewhere classified: Secondary | ICD-10-CM

## 2021-09-28 DIAGNOSIS — R2689 Other abnormalities of gait and mobility: Secondary | ICD-10-CM

## 2021-09-28 NOTE — Therapy (Signed)
[place], [place], [address] Phone: [phone]   Fax:  [phone]  Physical Therapy Treatment  Patient Details  Name: Gina Martin MRN: [id number] Date of Birth: April 28, 1991 Referring Provider (PT): Haddix, Thomasene Lot, MD   Encounter Date: 09/28/2021   PT End of Session - 09/28/21 1027     Visit Number 11    Number of Visits 24    Date for PT Re-Evaluation 10/29/21    Authorization Type Medicaid    Authorization Time Period 12 visit 09/22/21 to 11/02/21    Authorization - Visit Number 11    Authorization - Number of Visits 24    PT Start Time 1020    PT Stop Time 1106    PT Time Calculation (min) 46 min    Activity Tolerance Patient tolerated treatment well    Behavior During Therapy WFL for tasks assessed/performed             Past Medical History:  Diagnosis Date   Asthma    Bipolar 1 disorder ([place])    Hyperlipidemia    Preeclampsia    Schizophrenia ([place])     Past Surgical History:  Procedure Laterality Date   CESAREAN SECTION     EXTERNAL FIXATION LEG Right 05/07/2021   Procedure: EXTERNAL FIXATION AND IRRIGATION AND DEBRIDMENT  PILON;  Surgeon: Shona Needles, MD;  Location: [place];  Service: Orthopedics;  Laterality: Right;   EXTERNAL FIXATION REMOVAL Right 05/10/2021   Procedure: REMOVAL EXTERNAL FIXATION LEG;  Surgeon: Shona Needles, MD;  Location: [facility];  Service: Orthopedics;  Laterality: Right;   OPEN REDUCTION INTERNAL FIXATION (ORIF) TIBIA/FIBULA FRACTURE Right 05/10/2021   Procedure: OPEN REDUCTION INTERNAL FIXATION (ORIF) TIBIA/FIBULA FRACTURE;  Surgeon: Shona Needles, MD;  Location: [facility];  Service: Orthopedics;  Laterality: Right;   ORIF PATELLA Left 05/07/2021   Procedure: OPEN REDUCTION INTERNAL (ORIF) FIXATION PATELLA;  Surgeon: Shona Needles, MD;  Location: [facility];  Service: Orthopedics;  Laterality: Left;   TUBAL LIGATION      There were no vitals filed for this  visit.   Subjective Assessment - 09/28/21 1027     Subjective Pt states her ankle has been getting stiff but she's been trying to work it. Pt reports she was tired after going to the movies. Pt has started bone stimulator. Knee has not been bothering her.    Patient is accompained by: Family member    Limitations Standing;Lifting;Walking;House hold activities    How long can you sit comfortably? n/a    How long can you stand comfortably? A few seconds    How long can you walk comfortably? ~3' with RW (can be limited to pain or fatigue)    Patient Stated Goals Walk and return to PLOF    Currently in Pain? Yes    Pain Score 3     Pain Location Ankle    Pain Orientation Right    Pain Descriptors / Indicators Tightness                [facility] PT Assessment - 09/28/21 0001       Assessment   Medical Diagnosis Left Patella fx, Right Pilon fx S/P ORIF;    Referring Provider (PT) Haddix, Thomasene Lot, MD    Onset Date/Surgical Date 05/07/21    Hand Dominance Right    Next MD Visit 08/03/21  [facility] Adult PT Treatment/Exercise - 09/28/21 0001       Ambulation/Gait   Assistive device Straight cane    Gait Pattern Step-through pattern;Decreased weight shift to right;Antalgic   R ankle/hip inverted   Ambulation Surface Level;Indoor    Gait Comments Cues to maintain heel strike with increased eversion      Knee/Hip Exercises: Stretches   Gastroc Stretch Right;2 reps;30 seconds    Soleus Stretch Right;30 seconds;2 reps      Knee/Hip Exercises: Aerobic   Recumbent Bike L3 x 5 min      Knee/Hip Exercises: Machines for Strengthening   Total Gym Leg Press DL: 2x10 6 plates; SL 2x10 4 plates each leg      Knee/Hip Exercises: Standing   Heel Raises 2 sets;10 reps;Right    Heel Raises Limitations heel/toe raises staggered stance    Hip Abduction Stengthening;Right;Left;2 sets;10 reps;Knee straight    Abduction Limitations green tband    Hip  Extension --    Extension Limitations --    Other Standing Knee Exercises Tandem stance 2x30 sec    Other Standing Knee Exercises L foot tap on 4" step 2x10      Manual Therapy   Manual therapy comments gentle grade III R ankle mobilization: talocrural distraction, hindfoot inv/ev, tib/fib PA/AP, talocrural DF      Ankle Exercises: Seated   Other Seated Ankle Exercises 3 way ankle green tband 2x10                       PT Short Term Goals - 07/28/21 1028       PT SHORT TERM GOAL #1   Title Pt will be independent with initial HEP    Time 3    Period Weeks    Status Achieved    Target Date 07/30/21      PT SHORT TERM GOAL #2   Title Pt will improve R ankle ROM to at least within 5 deg of neutral    Time 3    Period Weeks    Status Partially Met    Target Date 07/30/21      PT SHORT TERM GOAL #3   Title Pt will be able to stand on L LE x5 min without use of knee brace    Baseline Able to hop on L LE with RW support    Time 3    Period Weeks    Status Achieved    Target Date 07/30/21      PT SHORT TERM GOAL #4   Title Pt will be able to amb with RW x 150' maintaining weight bearing precautions (R LE NWB) to demo improved household mobility    Baseline Able to amb in/out of clinic >150' with RW R LE NWB    Time 3    Period Weeks    Status Achieved    Target Date 07/30/21               PT Long Term Goals - 09/08/21 1045       PT LONG TERM GOAL #1   Title Pt will be independent with final HEP    Time 12    Period Weeks    Status On-going    Target Date 10/20/21      PT LONG TERM GOAL #2   Title Pt will be able to amb at least 1000' with LRAD for improved community mobility    Baseline [facility] household distances only  Time 12    Period Weeks    Status On-going    Target Date 10/20/21      PT LONG TERM GOAL #3   Title Pt will be able to perform 5x STS without UE support to demo improved functional strength    Baseline 30 sec with use of UEs     Time 12    Period Weeks    Status On-going    Target Date 10/20/21      PT LONG TERM GOAL #4   Title Pt will have R = L ankle ROM to be able to ascend/descend steps    Baseline R dorsiflexion remains 5 deg less than L    Time 12    Period Weeks    Status On-going    Target Date 10/20/21      PT LONG TERM GOAL #5   Title Pt will have improved FOTO score to predicted level    Time 12    Period Weeks    Status Deferred    Target Date 10/20/21                   Plan - 09/28/21 1114     Clinical Impression Statement Continued to work on improving ankle tightness. Remains very tight especially with ankle eversion and DF. This has been evident with her gait pattern as pt is now inverting/toeing during stance phase on R. Pt to return to performing ankle tband exercises and encouraged to perform more stretching at home. Progressed hip strengthening to green tband. Worked on ankle stability and balance in standing to improve weightbearing.    Personal Factors and Comorbidities Age;Fitness;Time since onset of injury/illness/exacerbation;Past/Current Experience    Examination-Activity Limitations Locomotion Level;Transfers;Stairs;Stand;Dressing;Hygiene/Grooming;Bend;Toileting;Lift    Examination-Participation Restrictions Cleaning;Community Activity;Driving;Occupation    Rehab Potential Good    PT Frequency 2x / week    PT Duration 12 weeks    PT Treatment/Interventions ADLs/Self Care Home Management;Aquatic Therapy;Iontophoresis 4mg /ml Dexamethasone;Moist Heat;DME Instruction;Cryotherapy;Electrical Stimulation;Gait training;Stair training;Functional mobility training;Therapeutic activities;Therapeutic exercise;Balance training;Neuromuscular re-education;Manual techniques;Patient/family education;Orthotic Fit/Training;Scar mobilization;Passive range of motion;Dry needling;Taping;Vasopneumatic Device;Joint Manipulations    PT Next Visit Plan Continue aquatics. Continue R ankle ROM,  balance/stability, and strengthening, L knee ROM and strengthening. Work on Aeronautical engineer!!    PT Home Exercise Plan Access Code [email or web address]    Consulted and Agree with Plan of Care Patient;Family member/caregiver             Patient will benefit from skilled therapeutic intervention in order to improve the following deficits and impairments:  Decreased range of motion, Difficulty walking, Increased fascial restricitons, Abnormal gait, Increased muscle spasms, Decreased activity tolerance, Pain, Decreased balance, Hypomobility, Impaired flexibility, Improper body mechanics, Decreased mobility, Decreased strength, Increased edema, Impaired sensation  Visit Diagnosis: Muscle weakness (generalized)  Difficulty in walking, not elsewhere classified  Localized edema  Other abnormalities of gait and mobility  Stiffness of right ankle, not elsewhere classified  Stiffness of left knee, not elsewhere classified     Problem List Patient Active Problem List   Diagnosis Date Noted   Left patella fracture 05/10/2021   Closed fracture of lateral portion of right tibial plateau 05/10/2021   Open pilon fracture, right, type III, initial encounter 05/07/2021    [facility] April Ma L Leonela Kivi, PT, DPT 09/28/2021, 11:18 AM  [facility] [place], [place], [address] Phone: [phone]   Fax:  [phone]  Name: Gina Martin MRN: [id number] Date of Birth: 10/30/1990

## 2021-09-29 ENCOUNTER — Ambulatory Visit (HOSPITAL_BASED_OUTPATIENT_CLINIC_OR_DEPARTMENT_OTHER): Payer: Medicaid Other | Admitting: Physical Therapy

## 2021-10-01 ENCOUNTER — Encounter (HOSPITAL_BASED_OUTPATIENT_CLINIC_OR_DEPARTMENT_OTHER): Payer: Self-pay | Admitting: Physical Therapy

## 2021-10-01 ENCOUNTER — Ambulatory Visit (HOSPITAL_BASED_OUTPATIENT_CLINIC_OR_DEPARTMENT_OTHER): Payer: Medicaid Other | Attending: Student | Admitting: Physical Therapy

## 2021-10-01 DIAGNOSIS — R262 Difficulty in walking, not elsewhere classified: Secondary | ICD-10-CM | POA: Diagnosis present

## 2021-10-01 DIAGNOSIS — M6281 Muscle weakness (generalized): Secondary | ICD-10-CM | POA: Insufficient documentation

## 2021-10-01 DIAGNOSIS — R600 Localized edema: Secondary | ICD-10-CM | POA: Diagnosis present

## 2021-10-01 NOTE — Therapy (Signed)
[place], [place], [address] Phone: [phone]   Fax:  [phone]  Physical Therapy Treatment  Patient Details  Name: Gina Martin MRN: [id number] Date of Birth: 11-17-1990 Referring Provider (PT): Haddix, Thomasene Lot, MD   Encounter Date: 10/01/2021   PT End of Session - 10/01/21 0827     Visit Number 12    Number of Visits 24    Date for PT Re-Evaluation 10/29/21    Authorization Type Medicaid    Authorization Time Period 12 visit 09/22/21 to 11/02/21    Authorization - Number of Visits 24    PT Start Time 0815   pt arrived late   PT Stop Time 0845    PT Time Calculation (min) 30 min    Activity Tolerance Patient tolerated treatment well    Behavior During Therapy [facility] for tasks assessed/performed             Past Medical History:  Diagnosis Date   Asthma    Bipolar 1 disorder ([place])    Hyperlipidemia    Preeclampsia    Schizophrenia ([place])     Past Surgical History:  Procedure Laterality Date   CESAREAN SECTION     EXTERNAL FIXATION LEG Right 05/07/2021   Procedure: EXTERNAL FIXATION AND IRRIGATION AND DEBRIDMENT  PILON;  Surgeon: Shona Needles, MD;  Location: [place];  Service: Orthopedics;  Laterality: Right;   EXTERNAL FIXATION REMOVAL Right 05/10/2021   Procedure: REMOVAL EXTERNAL FIXATION LEG;  Surgeon: Shona Needles, MD;  Location: [facility];  Service: Orthopedics;  Laterality: Right;   OPEN REDUCTION INTERNAL FIXATION (ORIF) TIBIA/FIBULA FRACTURE Right 05/10/2021   Procedure: OPEN REDUCTION INTERNAL FIXATION (ORIF) TIBIA/FIBULA FRACTURE;  Surgeon: Shona Needles, MD;  Location: [facility];  Service: Orthopedics;  Laterality: Right;   ORIF PATELLA Left 05/07/2021   Procedure: OPEN REDUCTION INTERNAL (ORIF) FIXATION PATELLA;  Surgeon: Shona Needles, MD;  Location: [place];  Service: Orthopedics;  Laterality: Left;   TUBAL LIGATION        Pt seen for aquatic therapy today.  Treatment took place  in water 3.25-4.25 ft in depth at the [facility] pool. Temp of water was 91.  Pt entered/exited the pool via stairs independently with bilat rail.   Side stepping holding wall R/L  Gait in ~67ft water with barbell:  forward/  backward  Cues for increased heel strike, roll through of foot, even weight shift and step length.  Multiple laps at slow, deliberate pace; then with increased speed.    Holding onto wall:   Heel / toe raises x 10 Squats x 10 Forward kick (cues for kicking a ball) x 10 each LE SLS R/L x 10 sec, x 15 sec (RLE with intermittent UE support - at 35ft) Gastroc stretch x 15 sec x 2 each LE Soleus stretch x 15 sec x 2 each LE  Forward step ups with RLE on steps exiting pool with UE on rails x 6     Pt requires buoyancy for support and to offload joints with strengthening exercises. Viscosity of the water is needed for resistance of strengthening; water current perturbations provides challenge to standing balance unsupported, requiring increased core activation.      PT Short Term Goals - 07/28/21 1028       PT SHORT TERM GOAL #1   Title Pt will be independent with initial HEP    Time 3    Period Weeks    Status  Achieved    Target Date 07/30/21      PT SHORT TERM GOAL #2   Title Pt will improve R ankle ROM to at least within 5 deg of neutral    Time 3    Period Weeks    Status Partially Met    Target Date 07/30/21      PT SHORT TERM GOAL #3   Title Pt will be able to stand on L LE x5 min without use of knee brace    Baseline Able to hop on L LE with RW support    Time 3    Period Weeks    Status Achieved    Target Date 07/30/21      PT SHORT TERM GOAL #4   Title Pt will be able to amb with RW x 150' maintaining weight bearing precautions (R LE NWB) to demo improved household mobility    Baseline Able to amb in/out of clinic >150' with RW R LE NWB    Time 3    Period Weeks    Status Achieved    Target Date 07/30/21               PT  Long Term Goals - 09/08/21 1045       PT LONG TERM GOAL #1   Title Pt will be independent with final HEP    Time 12    Period Weeks    Status On-going    Target Date 10/20/21      PT LONG TERM GOAL #2   Title Pt will be able to amb at least 1000' with LRAD for improved community mobility    Baseline [place] household distances only    Time 12    Period Weeks    Status On-going    Target Date 10/20/21      PT LONG TERM GOAL #3   Title Pt will be able to perform 5x STS without UE support to demo improved functional strength    Baseline 30 sec with use of UEs    Time 12    Period Weeks    Status On-going    Target Date 10/20/21      PT LONG TERM GOAL #4   Title Pt will have R = L ankle ROM to be able to ascend/descend steps    Baseline R dorsiflexion remains 5 deg less than L    Time 12    Period Weeks    Status On-going    Target Date 10/20/21      PT LONG TERM GOAL #5   Title Pt will have improved FOTO score to predicted level    Time 12    Period Weeks    Status Deferred    Target Date 10/20/21            Plan/ clinical impression:  Pt with improved gait quality in water with cues and repetition. Mod cues for toes forward (vs inversion). Limited tolerance for gastroc / soleus stretches, but completed 2 reps of each with cues for form. Limited carry over of gait quality once out of pool. Encouraged pt to work on gait at home with RW for even step length and even weight shift (vs hopping onto Lt foot). Session shortened due to late arrival. Progressing towards goals.     Patient will benefit from skilled therapeutic intervention in order to improve the following deficits and impairments:  Decreased range of motion, Difficulty walking, Increased fascial restricitons, Abnormal gait, Increased  muscle spasms, Decreased activity tolerance, Pain, Decreased balance, Hypomobility, Impaired flexibility, Improper body mechanics, Decreased mobility, Decreased strength, Increased  edema, Impaired sensation  Visit Diagnosis: Muscle weakness (generalized)  Difficulty in walking, not elsewhere classified  Localized edema     Problem List Patient Active Problem List   Diagnosis Date Noted   Left patella fracture 05/10/2021   Closed fracture of lateral portion of right tibial plateau 05/10/2021   Open pilon fracture, right, type III, initial encounter 05/07/2021   Kerin Perna, PTA 10/01/21 8:59 AM   [place], [place], [address] Phone: [phone]   Fax:  [phone]  Name: Gina Martin MRN: [id number] Date of Birth: 08-Jan-1991

## 2021-10-06 ENCOUNTER — Ambulatory Visit: Payer: Medicaid Other | Admitting: Physical Therapy

## 2021-10-08 ENCOUNTER — Ambulatory Visit: Payer: Medicaid Other | Attending: Student | Admitting: Physical Therapy

## 2021-10-08 DIAGNOSIS — M25671 Stiffness of right ankle, not elsewhere classified: Secondary | ICD-10-CM

## 2021-10-08 DIAGNOSIS — R600 Localized edema: Secondary | ICD-10-CM | POA: Diagnosis present

## 2021-10-08 DIAGNOSIS — R262 Difficulty in walking, not elsewhere classified: Secondary | ICD-10-CM | POA: Diagnosis present

## 2021-10-08 DIAGNOSIS — M6281 Muscle weakness (generalized): Secondary | ICD-10-CM | POA: Diagnosis present

## 2021-10-08 DIAGNOSIS — M25662 Stiffness of left knee, not elsewhere classified: Secondary | ICD-10-CM | POA: Diagnosis present

## 2021-10-08 DIAGNOSIS — R2689 Other abnormalities of gait and mobility: Secondary | ICD-10-CM | POA: Diagnosis present

## 2021-10-08 NOTE — Therapy (Signed)
[place], [place], [address] Phone: [phone]   Fax:  [phone]  Physical Therapy Treatment  Patient Details  Name: Gina Martin MRN: [id number] Date of Birth: [birth year] Referring Provider (PT): Haddix, Thomasene Lot, MD   Encounter Date: 10/08/2021   PT End of Session - 10/08/21 1021     Visit Number 13    Number of Visits 24    Date for PT Re-Evaluation 10/29/21    Authorization Type Medicaid    Authorization Time Period 12 visit 09/22/21 to 11/02/21    Authorization - Visit Number 13    Authorization - Number of Visits 24    PT Start Time 1016    PT Stop Time 1100    PT Time Calculation (min) 44 min    Activity Tolerance Patient tolerated treatment well    Behavior During Therapy [facility] for tasks assessed/performed             Past Medical History:  Diagnosis Date   Asthma    Bipolar 1 disorder ([place])    Hyperlipidemia    Preeclampsia    Schizophrenia ([place])     Past Surgical History:  Procedure Laterality Date   CESAREAN SECTION     EXTERNAL FIXATION LEG Right 05/07/2021   Procedure: EXTERNAL FIXATION AND IRRIGATION AND DEBRIDMENT  PILON;  Surgeon: Shona Needles, MD;  Location: [place];  Service: Orthopedics;  Laterality: Right;   EXTERNAL FIXATION REMOVAL Right 05/10/2021   Procedure: REMOVAL EXTERNAL FIXATION LEG;  Surgeon: Shona Needles, MD;  Location: [place];  Service: Orthopedics;  Laterality: Right;   OPEN REDUCTION INTERNAL FIXATION (ORIF) TIBIA/FIBULA FRACTURE Right 05/10/2021   Procedure: OPEN REDUCTION INTERNAL FIXATION (ORIF) TIBIA/FIBULA FRACTURE;  Surgeon: Shona Needles, MD;  Location: [place];  Service: Orthopedics;  Laterality: Right;   ORIF PATELLA Left 05/07/2021   Procedure: OPEN REDUCTION INTERNAL (ORIF) FIXATION PATELLA;  Surgeon: Shona Needles, MD;  Location: [place];  Service: Orthopedics;  Laterality: Left;   TUBAL LIGATION      There were no vitals filed for this  visit.   Subjective Assessment - 10/08/21 1021     Subjective Pt states she's been working on her amb    Patient is accompained by: Family member    Limitations Standing;Lifting;Walking;House hold activities    How long can you sit comfortably? n/a    How long can you stand comfortably? A few seconds    How long can you walk comfortably? ~1' with RW (can be limited to pain or fatigue)    Patient Stated Goals Walk and return to PLOF    Currently in Pain? Yes    Pain Score 3     Pain Location Ankle    Pain Orientation Right    Pain Descriptors / Indicators Sore;Tightness                [facility] PT Assessment - 10/08/21 0001       Assessment   Medical Diagnosis Left Patella fx, Right Pilon fx S/P ORIF;    Referring Provider (PT) Haddix, Thomasene Lot, MD    Onset Date/Surgical Date 05/07/21    Hand Dominance Right    Next MD Visit 08/03/21                           [facility] Adult PT Treatment/Exercise - 10/08/21 0001       Ambulation/Gait   Assistive  device --   counter support   Gait Pattern Step-through pattern;Decreased weight shift to right;Antalgic    Ambulation Surface Level;Indoor    Gait Comments Cues to maintain heel strike with increased eversion and decreasing trunk flexion with R LE weightbearing      Knee/Hip Exercises: Stretches   Passive Hamstring Stretch Right;Left;30 seconds    Quad Stretch Right;Left;30 seconds    Gastroc Stretch Right;2 reps;30 seconds    Gastroc Stretch Limitations on step    Soleus Stretch Right;30 seconds;2 reps    Soleus Stretch Limitations seated with strap; unable to perform with      Knee/Hip Exercises: Aerobic   Recumbent Bike L3 x 8 min      Knee/Hip Exercises: Machines for Strengthening   Total Gym Leg Press --      Knee/Hip Exercises: Standing   Forward Step Up Right;Left;10 reps;Step Height: 4"    SLS 2x30 sec with counter support      Knee/Hip Exercises: Seated   Sit to Sand 2 sets;5 reps;without UE  support   with mirror for equal weight shift     Knee/Hip Exercises: Supine   Straight Leg Raises Strengthening;Left;2 sets;10 reps;Right    Straight Leg Raises Limitations green tband      Knee/Hip Exercises: Prone   Straight Leg Raises Strengthening;Right;Left;2 sets;10 reps    Straight Leg Raises Limitations green tband                       PT Short Term Goals - 07/28/21 1028       PT SHORT TERM GOAL #1   Title Pt will be independent with initial HEP    Time 3    Period Weeks    Status Achieved    Target Date 07/30/21      PT SHORT TERM GOAL #2   Title Pt will improve R ankle ROM to at least within 5 deg of neutral    Time 3    Period Weeks    Status Partially Met    Target Date 07/30/21      PT SHORT TERM GOAL #3   Title Pt will be able to stand on L LE x5 min without use of knee brace    Baseline Able to hop on L LE with RW support    Time 3    Period Weeks    Status Achieved    Target Date 07/30/21      PT SHORT TERM GOAL #4   Title Pt will be able to amb with RW x 150' maintaining weight bearing precautions (R LE NWB) to demo improved household mobility    Baseline Able to amb in/out of clinic >150' with RW R LE NWB    Time 3    Period Weeks    Status Achieved    Target Date 07/30/21               PT Long Term Goals - 09/08/21 1045       PT LONG TERM GOAL #1   Title Pt will be independent with final HEP    Time 12    Period Weeks    Status On-going    Target Date 10/20/21      PT LONG TERM GOAL #2   Title Pt will be able to amb at least 1000' with LRAD for improved community mobility    Baseline SPC household distances only    Time 12    Period  Weeks    Status On-going    Target Date 10/20/21      PT LONG TERM GOAL #3   Title Pt will be able to perform 5x STS without UE support to demo improved functional strength    Baseline 30 sec with use of UEs    Time 12    Period Weeks    Status On-going    Target Date 10/20/21       PT LONG TERM GOAL #4   Title Pt will have R = L ankle ROM to be able to ascend/descend steps    Baseline R dorsiflexion remains 5 deg less than L    Time 12    Period Weeks    Status On-going    Target Date 10/20/21      PT LONG TERM GOAL #5   Title Pt will have improved FOTO score to predicted level    Time 12    Period Weeks    Status Deferred    Target Date 10/20/21                   Plan - 10/08/21 1048     Clinical Impression Statement Continued to work on progressing pt's endurance and strength. Muscle fatigue in quads and hamstring after ~7 min on bike. Working on single leg stability. Notes "pressure" with single leg standing but no pain. R ankle remains tight and limited.    Personal Factors and Comorbidities Age;Fitness;Time since onset of injury/illness/exacerbation;Past/Current Experience    Examination-Activity Limitations Locomotion Level;Transfers;Stairs;Stand;Dressing;Hygiene/Grooming;Bend;Toileting;Lift    Examination-Participation Restrictions Cleaning;Community Activity;Driving;Occupation    Rehab Potential Good    PT Frequency 2x / week    PT Duration 12 weeks    PT Treatment/Interventions ADLs/Self Care Home Management;Aquatic Therapy;Iontophoresis 53m/ml Dexamethasone;Moist Heat;DME Instruction;Cryotherapy;Electrical Stimulation;Gait training;Stair training;Functional mobility training;Therapeutic activities;Therapeutic exercise;Balance training;Neuromuscular re-education;Manual techniques;Patient/family education;Orthotic Fit/Training;Scar mobilization;Passive range of motion;Dry needling;Taping;Vasopneumatic Device;Joint Manipulations    PT Next Visit Plan Continue aquatics. Continue R ankle ROM, balance/stability, and strengthening, L knee ROM and strengthening. Work on gAeronautical engineer!    PT Home Exercise Plan Access Code [email or web address]   Consulted and Agree with Plan of Care Patient;Family member/caregiver             Patient will benefit from  skilled therapeutic intervention in order to improve the following deficits and impairments:  Decreased range of motion, Difficulty walking, Increased fascial restricitons, Abnormal gait, Increased muscle spasms, Decreased activity tolerance, Pain, Decreased balance, Hypomobility, Impaired flexibility, Improper body mechanics, Decreased mobility, Decreased strength, Increased edema, Impaired sensation  Visit Diagnosis: Muscle weakness (generalized)  Difficulty in walking, not elsewhere classified  Localized edema  Other abnormalities of gait and mobility  Stiffness of right ankle, not elsewhere classified  Stiffness of left knee, not elsewhere classified     Problem List Patient Active Problem List   Diagnosis Date Noted   Left patella fracture 05/10/2021   Closed fracture of lateral portion of right tibial plateau 05/10/2021   Open pilon fracture, right, type III, initial encounter 05/07/2021    GNorth Sunflower Medical CenterApril MBeatrix ShipperNMerced PT 10/08/2021, 11:58 AM  [facility] [address]Phone: [phone]  Fax:  [phone] Name: SShanitha TwiningMRN: [id number] of Birth: 2Jun 30, 1992

## 2021-10-12 ENCOUNTER — Ambulatory Visit: Payer: Medicaid Other | Admitting: Physical Therapy

## 2021-10-12 DIAGNOSIS — M6281 Muscle weakness (generalized): Secondary | ICD-10-CM | POA: Diagnosis not present

## 2021-10-12 DIAGNOSIS — R600 Localized edema: Secondary | ICD-10-CM

## 2021-10-12 DIAGNOSIS — R262 Difficulty in walking, not elsewhere classified: Secondary | ICD-10-CM

## 2021-10-12 DIAGNOSIS — M25662 Stiffness of left knee, not elsewhere classified: Secondary | ICD-10-CM

## 2021-10-12 DIAGNOSIS — R2689 Other abnormalities of gait and mobility: Secondary | ICD-10-CM

## 2021-10-12 DIAGNOSIS — M25671 Stiffness of right ankle, not elsewhere classified: Secondary | ICD-10-CM

## 2021-10-12 NOTE — Therapy (Signed)
[place] [facility] [place], [place], [address] Phone: [phone]   Fax:  [phone]  Physical Therapy Treatment  Patient Details  Name: Gina Martin MRN: [id number] Date of Birth: August 28, 1990 Referring Provider (PT): Haddix, Thomasene Lot, MD   Encounter Date: 10/12/2021   PT End of Session - 10/12/21 1016     Visit Number 14    Number of Visits 24    Date for PT Re-Evaluation 10/29/21    Authorization Type Medicaid    Authorization Time Period 12 visit 09/22/21 to 11/02/21    Authorization - Visit Number 14    Authorization - Number of Visits 24    PT Start Time 1017    PT Stop Time 1100    PT Time Calculation (min) 43 min    Activity Tolerance Patient tolerated treatment well    Behavior During Therapy WFL for tasks assessed/performed             Past Medical History:  Diagnosis Date   Asthma    Bipolar 1 disorder ([place])    Hyperlipidemia    Preeclampsia    Schizophrenia ([place])     Past Surgical History:  Procedure Laterality Date   CESAREAN SECTION     EXTERNAL FIXATION LEG Right 05/07/2021   Procedure: EXTERNAL FIXATION AND IRRIGATION AND DEBRIDMENT  PILON;  Surgeon: Shona Needles, MD;  Location: [place];  Service: Orthopedics;  Laterality: Right;   EXTERNAL FIXATION REMOVAL Right 05/10/2021   Procedure: REMOVAL EXTERNAL FIXATION LEG;  Surgeon: Shona Needles, MD;  Location: [place];  Service: Orthopedics;  Laterality: Right;   OPEN REDUCTION INTERNAL FIXATION (ORIF) TIBIA/FIBULA FRACTURE Right 05/10/2021   Procedure: OPEN REDUCTION INTERNAL FIXATION (ORIF) TIBIA/FIBULA FRACTURE;  Surgeon: Shona Needles, MD;  Location: [place];  Service: Orthopedics;  Laterality: Right;   ORIF PATELLA Left 05/07/2021   Procedure: OPEN REDUCTION INTERNAL (ORIF) FIXATION PATELLA;  Surgeon: Shona Needles, MD;  Location: [place];  Service: Orthopedics;  Laterality: Left;   TUBAL LIGATION      There were no vitals filed for this  visit.   Subjective Assessment - 10/12/21 1020     Subjective Pt states she's been going swimming. States she was at the pool for 3 hours. Has been practicing her single leg balance. Pt is to see MD tomorrow.    Patient is accompained by: Family member    Limitations Standing;Lifting;Walking;House hold activities    How long can you sit comfortably? n/a    How long can you stand comfortably? A few seconds    How long can you walk comfortably? ~50' with RW (can be limited to pain or fatigue)    Patient Stated Goals Walk and return to PLOF    Currently in Pain? Yes    Pain Score 3     Pain Location Ankle                [facility] PT Assessment - 10/12/21 0001       Assessment   Medical Diagnosis Left Patella fx, Right Pilon fx S/P ORIF;    Referring Provider (PT) Haddix, Thomasene Lot, MD    Onset Date/Surgical Date 05/07/21    Hand Dominance Right    Next MD Visit 08/03/21                           [facility] Adult PT Treatment/Exercise - 10/12/21 0001  Ambulation/Gait   Gait Comments Cues to maintain heel strike with increased eversion and decreasing trunk flexion with R LE weightbearing      Knee/Hip Exercises: Stretches   Gastroc Stretch Right;30 seconds;Left      Knee/Hip Exercises: Aerobic   Recumbent Bike L3 x 9 min      Knee/Hip Exercises: Machines for Strengthening   Total Gym Leg Press DL: 2x10 7 plates; SL 2x8 5 plates each leg; SL heel raise 2x10 at 2 plates      Knee/Hip Exercises: Standing   Heel Raises 10 reps;Both    Heel Raises Limitations Unable to perform on R LE only    SLS 2x30 sec with counter support    Other Standing Knee Exercises L foot on step R foot balance 2x30 sec                       PT Short Term Goals - 07/28/21 1028       PT SHORT TERM GOAL #1   Title Pt will be independent with initial HEP    Time 3    Period Weeks    Status Achieved    Target Date 07/30/21      PT SHORT TERM GOAL #2   Title Pt will  improve R ankle ROM to at least within 5 deg of neutral    Time 3    Period Weeks    Status Partially Met    Target Date 07/30/21      PT SHORT TERM GOAL #3   Title Pt will be able to stand on L LE x5 min without use of knee brace    Baseline Able to hop on L LE with RW support    Time 3    Period Weeks    Status Achieved    Target Date 07/30/21      PT SHORT TERM GOAL #4   Title Pt will be able to amb with RW x 150' maintaining weight bearing precautions (R LE NWB) to demo improved household mobility    Baseline Able to amb in/out of clinic >150' with RW R LE NWB    Time 3    Period Weeks    Status Achieved    Target Date 07/30/21               PT Long Term Goals - 09/08/21 1045       PT LONG TERM GOAL #1   Title Pt will be independent with final HEP    Time 12    Period Weeks    Status On-going    Target Date 10/20/21      PT LONG TERM GOAL #2   Title Pt will be able to amb at least 1000' with LRAD for improved community mobility    Baseline [place] household distances only    Time 12    Period Weeks    Status On-going    Target Date 10/20/21      PT LONG TERM GOAL #3   Title Pt will be able to perform 5x STS without UE support to demo improved functional strength    Baseline 30 sec with use of UEs    Time 12    Period Weeks    Status On-going    Target Date 10/20/21      PT LONG TERM GOAL #4   Title Pt will have R = L ankle ROM to be able to ascend/descend steps  Baseline R dorsiflexion remains 5 deg less than L    Time 12    Period Weeks    Status On-going    Target Date 10/20/21      PT LONG TERM GOAL #5   Title Pt will have improved FOTO score to predicted level    Time 12    Period Weeks    Status Deferred    Target Date 10/20/21                   Plan - 10/12/21 1030     Clinical Impression Statement Improving endurance noted. Less fatigue with bike -- able to go up to 9 min. Continued strengthening and single leg stability. R  ankle PFs too strong for tbands and pt able to perform DL heel raise; however, not able to perform SL on R.    Personal Factors and Comorbidities Age;Fitness;Time since onset of injury/illness/exacerbation;Past/Current Experience    Examination-Activity Limitations Locomotion Level;Transfers;Stairs;Stand;Dressing;Hygiene/Grooming;Bend;Toileting;Lift    Examination-Participation Restrictions Cleaning;Community Activity;Driving;Occupation    Rehab Potential Good    PT Frequency 2x / week    PT Duration 12 weeks    PT Treatment/Interventions ADLs/Self Care Home Management;Aquatic Therapy;Iontophoresis 4mg /ml Dexamethasone;Moist Heat;DME Instruction;Cryotherapy;Electrical Stimulation;Gait training;Stair training;Functional mobility training;Therapeutic activities;Therapeutic exercise;Balance training;Neuromuscular re-education;Manual techniques;Patient/family education;Orthotic Fit/Training;Scar mobilization;Passive range of motion;Dry needling;Taping;Vasopneumatic Device;Joint Manipulations    PT Next Visit Plan Continue aquatics. Continue R ankle ROM, balance/stability, and strengthening, L knee ROM and strengthening. Work on Aeronautical engineer!!    PT Home Exercise Plan Access Code [email or web address]    Consulted and Agree with Plan of Care Patient;Family member/caregiver             Patient will benefit from skilled therapeutic intervention in order to improve the following deficits and impairments:  Decreased range of motion, Difficulty walking, Increased fascial restricitons, Abnormal gait, Increased muscle spasms, Decreased activity tolerance, Pain, Decreased balance, Hypomobility, Impaired flexibility, Improper body mechanics, Decreased mobility, Decreased strength, Increased edema, Impaired sensation  Visit Diagnosis: Muscle weakness (generalized)  Difficulty in walking, not elsewhere classified  Localized edema  Other abnormalities of gait and mobility  Stiffness of right ankle, not elsewhere  classified  Stiffness of left knee, not elsewhere classified     Problem List Patient Active Problem List   Diagnosis Date Noted   Left patella fracture 05/10/2021   Closed fracture of lateral portion of right tibial plateau 05/10/2021   Open pilon fracture, right, type III, initial encounter 05/07/2021    [facility] April Ma L Jsoeph Podesta, PT, DPT 10/12/2021, 11:02 AM  [facility] [place], [place], [address] Phone: [phone]   Fax:  [phone]  Name: Markasia Carrol MRN: [id number] Date of Birth: 1990/06/04

## 2021-10-14 ENCOUNTER — Ambulatory Visit (HOSPITAL_BASED_OUTPATIENT_CLINIC_OR_DEPARTMENT_OTHER): Payer: Medicaid Other | Attending: Student | Admitting: Physical Therapy

## 2021-10-14 ENCOUNTER — Encounter (HOSPITAL_BASED_OUTPATIENT_CLINIC_OR_DEPARTMENT_OTHER): Payer: Self-pay | Admitting: Physical Therapy

## 2021-10-14 DIAGNOSIS — R2689 Other abnormalities of gait and mobility: Secondary | ICD-10-CM | POA: Diagnosis not present

## 2021-10-14 DIAGNOSIS — R600 Localized edema: Secondary | ICD-10-CM

## 2021-10-14 DIAGNOSIS — R262 Difficulty in walking, not elsewhere classified: Secondary | ICD-10-CM

## 2021-10-14 DIAGNOSIS — M25671 Stiffness of right ankle, not elsewhere classified: Secondary | ICD-10-CM

## 2021-10-14 DIAGNOSIS — M6281 Muscle weakness (generalized): Secondary | ICD-10-CM

## 2021-10-14 DIAGNOSIS — M25662 Stiffness of left knee, not elsewhere classified: Secondary | ICD-10-CM

## 2021-10-14 DIAGNOSIS — M25571 Pain in right ankle and joints of right foot: Secondary | ICD-10-CM | POA: Diagnosis not present

## 2021-10-14 NOTE — Therapy (Signed)
[place], [place], [address] Phone: [phone]   Fax:  [phone]  Physical Therapy Treatment  Patient Details  Name: Gina Martin MRN: [id number] Date of Birth: 1990-12-14 Referring Provider (PT): Haddix, Thomasene Lot, MD   Encounter Date: 10/14/2021   PT End of Session - 10/14/21 1709     Visit Number 15    Number of Visits 24    Date for PT Re-Evaluation 10/29/21    Authorization Type Medicaid    Authorization Time Period 12 visit 09/22/21 to 11/02/21    Authorization - Visit Number 15    Authorization - Number of Visits 24    PT Start Time 8242    PT Stop Time 1744    PT Time Calculation (min) 46 min    Activity Tolerance Patient tolerated treatment well    Behavior During Therapy [facility] for tasks assessed/performed             Past Medical History:  Diagnosis Date   Asthma    Bipolar 1 disorder ([place])    Hyperlipidemia    Preeclampsia    Schizophrenia ([place])     Past Surgical History:  Procedure Laterality Date   CESAREAN SECTION     EXTERNAL FIXATION LEG Right 05/07/2021   Procedure: EXTERNAL FIXATION AND IRRIGATION AND DEBRIDMENT  PILON;  Surgeon: Shona Needles, MD;  Location: [place];  Service: Orthopedics;  Laterality: Right;   EXTERNAL FIXATION REMOVAL Right 05/10/2021   Procedure: REMOVAL EXTERNAL FIXATION LEG;  Surgeon: Shona Needles, MD;  Location: [place];  Service: Orthopedics;  Laterality: Right;   OPEN REDUCTION INTERNAL FIXATION (ORIF) TIBIA/FIBULA FRACTURE Right 05/10/2021   Procedure: OPEN REDUCTION INTERNAL FIXATION (ORIF) TIBIA/FIBULA FRACTURE;  Surgeon: Shona Needles, MD;  Location: [place];  Service: Orthopedics;  Laterality: Right;   ORIF PATELLA Left 05/07/2021   Procedure: OPEN REDUCTION INTERNAL (ORIF) FIXATION PATELLA;  Surgeon: Shona Needles, MD;  Location: [place];  Service: Orthopedics;  Laterality: Left;   TUBAL LIGATION       Subjective Assessment - 10/14/21 1720      Subjective Pt reports she missed her MD appt; it is rescheduled for the 27th.  She reports she did well after last land session.    Patient is accompained by: Family member    Limitations Standing;Lifting;Walking;House hold activities    How long can you sit comfortably? n/a    How long can you stand comfortably? A few seconds    How long can you walk comfortably? ~75' with RW (can be limited to pain or fatigue)    Patient Stated Goals Walk and return to PLOF    Currently in Pain? Yes    Pain Score 5     Pain Location Ankle    Pain Orientation Right;Posterior;Lateral    Pain Descriptors / Indicators Tightness;Aching    Aggravating Factors  weather    Pain Relieving Factors elevating; hot rag               Pt seen for aquatic therapy today.  Treatment took place in water 3.25-4.8 ft in depth at the [employer] pool. Temp of water was 91.  Pt entered/exited the pool via stairs independently with bilat rail.   Gait without / with UE support - forward / backward gait  - side stepping Cues for increased heel strike, roll through of foot, even weight shift and step length.  Multiple laps at slow, deliberate pace; then with  increased speed.  Braiding  R / L   Holding onto wall:   Bilat Heel / toe raises x 10;  unilateral heel raises x 10 Gastroc stretch R/L; Soleus stretch R High knee marching with reciprocal arm swing Side step squat R/L  Staddling yellow noodle with UE support in corner cycling, hip abdct/ add and cross country ski - 2 rounds Forward step ups with RLE, retro step downs with LLE - multiple reps, cues to allow Rt knee to flex while slowly lowering down - UE support on rails    Pt requires buoyancy for support and to offload joints with strengthening exercises. Viscosity of the water is needed for resistance of strengthening; water current perturbations provides challenge to standing balance unsupported, requiring increased core activation.      PT Short  Term Goals - 07/28/21 1028       PT SHORT TERM GOAL #1   Title Pt will be independent with initial HEP    Time 3    Period Weeks    Status Achieved    Target Date 07/30/21      PT SHORT TERM GOAL #2   Title Pt will improve R ankle ROM to at least within 5 deg of neutral    Time 3    Period Weeks    Status Partially Met    Target Date 07/30/21      PT SHORT TERM GOAL #3   Title Pt will be able to stand on L LE x5 min without use of knee brace    Baseline Able to hop on L LE with RW support    Time 3    Period Weeks    Status Achieved    Target Date 07/30/21      PT SHORT TERM GOAL #4   Title Pt will be able to amb with RW x 150' maintaining weight bearing precautions (R LE NWB) to demo improved household mobility    Baseline Able to amb in/out of clinic >150' with RW R LE NWB    Time 3    Period Weeks    Status Achieved    Target Date 07/30/21               PT Long Term Goals - 09/08/21 1045       PT LONG TERM GOAL #1   Title Pt will be independent with final HEP    Time 12    Period Weeks    Status On-going    Target Date 10/20/21      PT LONG TERM GOAL #2   Title Pt will be able to amb at least 1000' with LRAD for improved community mobility    Baseline [place] household distances only    Time 12    Period Weeks    Status On-going    Target Date 10/20/21      PT LONG TERM GOAL #3   Title Pt will be able to perform 5x STS without UE support to demo improved functional strength    Baseline 30 sec with use of UEs    Time 12    Period Weeks    Status On-going    Target Date 10/20/21      PT LONG TERM GOAL #4   Title Pt will have R = L ankle ROM to be able to ascend/descend steps    Baseline R dorsiflexion remains 5 deg less than L    Time 12    Period Weeks  Status On-going    Target Date 10/20/21      PT LONG TERM GOAL #5   Title Pt will have improved FOTO score to predicted level    Time 12    Period Weeks    Status Deferred    Target Date  10/20/21            Plan/ clinical impression:  Pt with improved gait quality in water with cues and repetition. Mod cues for weight through great toe (vs inversion and weight over fifth).Improved tolerance for gastroc / soleus stretches, but requires cues for form. Limited carry over of gait quality once out of pool. Encouraged continued work on gait quality - even stance time (vs hopping onto Lt foot). Pt reported no increase in pain in Rt ankle during session. Progressing towards goals.     Patient will benefit from skilled therapeutic intervention in order to improve the following deficits and impairments:     Visit Diagnosis: Muscle weakness (generalized)  Difficulty in walking, not elsewhere classified  Localized edema  Other abnormalities of gait and mobility  Stiffness of right ankle, not elsewhere classified  Stiffness of left knee, not elsewhere classified     Problem List Patient Active Problem List   Diagnosis Date Noted   Left patella fracture 05/10/2021   Closed fracture of lateral portion of right tibial plateau 05/10/2021   Open pilon fracture, right, type III, initial encounter 05/07/2021   Kerin Perna, PTA 10/14/21 6:05 PM   [facility] [address] [place], [place], [address] Phone: [phone]   Fax:  [phone]  Name: Gina Martin MRN: [id number] Date of Birth: 1991-01-31

## 2021-10-18 ENCOUNTER — Ambulatory Visit: Payer: Medicaid Other | Admitting: Physical Therapy

## 2021-10-21 ENCOUNTER — Ambulatory Visit: Payer: Medicaid Other | Admitting: Physical Therapy

## 2021-10-21 DIAGNOSIS — R600 Localized edema: Secondary | ICD-10-CM

## 2021-10-21 DIAGNOSIS — R262 Difficulty in walking, not elsewhere classified: Secondary | ICD-10-CM

## 2021-10-21 DIAGNOSIS — M6281 Muscle weakness (generalized): Secondary | ICD-10-CM

## 2021-10-21 DIAGNOSIS — M25671 Stiffness of right ankle, not elsewhere classified: Secondary | ICD-10-CM

## 2021-10-21 DIAGNOSIS — M25662 Stiffness of left knee, not elsewhere classified: Secondary | ICD-10-CM

## 2021-10-21 DIAGNOSIS — R2689 Other abnormalities of gait and mobility: Secondary | ICD-10-CM

## 2021-10-21 NOTE — Therapy (Signed)
[place], [place], [address] Phone: [phone]   Fax:  [phone]  Physical Therapy Treatment and Re-Cert  Patient Details  Name: Gina Martin MRN: [id number] Date of Birth: [birth year] Referring Provider (PT): Haddix, Thomasene Lot, MD   Encounter Date: 10/21/2021   PT End of Session - 10/21/21 1028     Visit Number 16    Number of Visits 24    Date for PT Re-Evaluation 10/29/21    Authorization Type Medicaid    Authorization Time Period 12 visit 09/22/21 to 11/02/21    Authorization - Visit Number 16    Authorization - Number of Visits 24    PT Start Time 1029   late arrival   PT Stop Time 1100    PT Time Calculation (min) 31 min    Activity Tolerance Patient tolerated treatment well    Behavior During Therapy [facility] for tasks assessed/performed             Past Medical History:  Diagnosis Date   Asthma    Bipolar 1 disorder ([place])    Hyperlipidemia    Preeclampsia    Schizophrenia ([place])     Past Surgical History:  Procedure Laterality Date   CESAREAN SECTION     EXTERNAL FIXATION LEG Right 05/07/2021   Procedure: EXTERNAL FIXATION AND IRRIGATION AND DEBRIDMENT  PILON;  Surgeon: Shona Needles, MD;  Location: [place];  Service: Orthopedics;  Laterality: Right;   EXTERNAL FIXATION REMOVAL Right 05/10/2021   Procedure: REMOVAL EXTERNAL FIXATION LEG;  Surgeon: Shona Needles, MD;  Location: [place];  Service: Orthopedics;  Laterality: Right;   OPEN REDUCTION INTERNAL FIXATION (ORIF) TIBIA/FIBULA FRACTURE Right 05/10/2021   Procedure: OPEN REDUCTION INTERNAL FIXATION (ORIF) TIBIA/FIBULA FRACTURE;  Surgeon: Shona Needles, MD;  Location: [place];  Service: Orthopedics;  Laterality: Right;   ORIF PATELLA Left 05/07/2021   Procedure: OPEN REDUCTION INTERNAL (ORIF) FIXATION PATELLA;  Surgeon: Shona Needles, MD;  Location: [place];  Service: Orthopedics;  Laterality: Left;   TUBAL LIGATION      There were  no vitals filed for this visit.   Subjective Assessment - 10/21/21 1032     Subjective Pt states she had to do a lot of walking and driving to/from the hospital. Son was in the hospital but back home now.    Patient is accompained by: Family member    Limitations Standing;Lifting;Walking;House hold activities    How long can you sit comfortably? n/a    How long can you stand comfortably? A few seconds    How long can you walk comfortably? ~37' with RW (can be limited to pain or fatigue)    Patient Stated Goals Walk and return to PLOF    Currently in Pain? Yes    Pain Score 7     Pain Location Generalized                [facility] PT Assessment - 10/21/21 0001       Assessment   Medical Diagnosis Left Patella fx, Right Pilon fx S/P ORIF;    Referring Provider (PT) Haddix, Thomasene Lot, MD    Onset Date/Surgical Date 05/07/21    Hand Dominance Right    Next MD Visit 08/03/21                           [facility] Adult PT Treatment/Exercise - 10/21/21 0001  Knee/Hip Exercises: Stretches   Passive Hamstring Stretch Right;Left;30 seconds;2 reps    Quad Stretch Right;Left;30 seconds    Other Knee/Hip Stretches ant tib stretch 2x30 sec      Knee/Hip Exercises: Standing   Forward Step Up Left;Step Height: 6";5 reps    Other Standing Knee Exercises Foot tap R & L on 6" step working on hip stabilization x10      Modalities   Modalities Iontophoresis      Iontophoresis   Type of Iontophoresis Dexamethasone    Location anterior R shin    Dose 79mA/hr    Time 8 hour      Manual Therapy   Manual Therapy Soft tissue mobilization    Soft tissue mobilization TPR and STM R ant tib      Ankle Exercises: Seated   [place] Pickup x20    Toe Raise 20 reps                       PT Short Term Goals - 07/28/21 1028       PT SHORT TERM GOAL #1   Title Pt will be independent with initial HEP    Time 3    Period Weeks    Status Achieved    Target Date  07/30/21      PT SHORT TERM GOAL #2   Title Pt will improve R ankle ROM to at least within 5 deg of neutral    Time 3    Period Weeks    Status Partially Met    Target Date 07/30/21      PT SHORT TERM GOAL #3   Title Pt will be able to stand on L LE x5 min without use of knee brace    Baseline Able to hop on L LE with RW support    Time 3    Period Weeks    Status Achieved    Target Date 07/30/21      PT SHORT TERM GOAL #4   Title Pt will be able to amb with RW x 150' maintaining weight bearing precautions (R LE NWB) to demo improved household mobility    Baseline Able to amb in/out of clinic >150' with RW R LE NWB    Time 3    Period Weeks    Status Achieved    Target Date 07/30/21             Previous:   PT Long Term Goals - 10/21/21 1112       PT LONG TERM GOAL #1   Title Pt will be independent with final HEP    Time 12    Period Weeks    Status On-going    Target Date 10/20/21      PT LONG TERM GOAL #2   Title Pt will be able to amb at least 1000' with LRAD for improved community mobility    Baseline SPC for limited community distances (~1/2 to 1 block') -- increased distance makes pt too sore    Time 12    Period Weeks    Status Partially Met    Target Date 10/20/21      PT LONG TERM GOAL #3   Title Pt will be able to perform 5x STS without UE support to demo improved functional strength    Baseline able to perform 1 without UEs    Time 12    Period Weeks    Status Partially Met  Target Date 10/20/21      PT LONG TERM GOAL #4   Title Pt will have R = L ankle ROM to be able to ascend/descend steps    Baseline R dorsiflexion remains 5 deg less than L    Time 12    Period Weeks    Status On-going    Target Date 10/20/21      PT LONG TERM GOAL #5   Title Pt will have improved FOTO score to predicted level    Time 12    Period Weeks    Status Deferred    Target Date 10/20/21            Revised:  PT Long Term Goals - 10/21/21 1112        PT LONG TERM GOAL #1   Title Pt will be independent with final HEP    Time 6    Period Weeks    Status On-going    Target Date 12/02/21      PT LONG TERM GOAL #2   Title Pt will be able to amb at least 1000' with LRAD for improved community mobility    Baseline SPC for limited community distances (~1/2 to 1 block') -- increased distance makes pt too sore    Time 6    Period Weeks    Status On-going    Target Date 12/02/21      PT LONG TERM GOAL #3   Title Pt will be able to perform 5x STS without UE support to demo improved functional strength    Baseline able to perform 1 without UEs    Time 6    Period Weeks    Status Partially Met    Target Date 12/02/21      PT LONG TERM GOAL #4   Title Pt will have R = L ankle ROM to be able to ascend/descend steps    Baseline R dorsiflexion remains 5 deg less than L    Time 6    Period Weeks    Status On-going    Target Date 12/02/21      PT LONG TERM GOAL #5   Title Pt will have improved FOTO score to predicted level    Time 6    Period Weeks    Status Deferred    Target Date 12/02/21                   Plan - 10/21/21 1110     Clinical Impression Statement Pt limited due to residual soreness and pain from prolonged standing and walking this past week. Most of pain centers around R ant tib. Provided pt education on stretching and manual massage to decrease pain/soreness. Continuing to work on foot/ankle strength, ROM and single leg stability. Will benefit from continued hip and knee strengthening bilat. Pt will benefit from continued PT to address her deficits and reach LTGs.    Personal Factors and Comorbidities Age;Fitness;Time since onset of injury/illness/exacerbation;Past/Current Experience    Examination-Activity Limitations Locomotion Level;Transfers;Stairs;Stand;Dressing;Hygiene/Grooming;Bend;Toileting;Lift    Examination-Participation Restrictions Cleaning;Community Activity;Driving;Occupation    Rehab  Potential Good    PT Frequency 2x / week    PT Duration 12 weeks    PT Treatment/Interventions ADLs/Self Care Home Management;Aquatic Therapy;Iontophoresis 4mg /ml Dexamethasone;Moist Heat;DME Instruction;Cryotherapy;Electrical Stimulation;Gait training;Stair training;Functional mobility training;Therapeutic activities;Therapeutic exercise;Balance training;Neuromuscular re-education;Manual techniques;Patient/family education;Orthotic Fit/Training;Scar mobilization;Passive range of motion;Dry needling;Taping;Vasopneumatic Device;Joint Manipulations    PT Next Visit Plan Continue aquatics. Continue R ankle ROM, balance/stability, and strengthening, L knee ROM  and strengthening. Work on Aeronautical engineer!!    PT Home Exercise Plan Access Code [email or web address]    Consulted and Agree with Plan of Care Patient;Family member/caregiver             Patient will benefit from skilled therapeutic intervention in order to improve the following deficits and impairments:  Decreased range of motion, Difficulty walking, Increased fascial restricitons, Abnormal gait, Increased muscle spasms, Decreased activity tolerance, Pain, Decreased balance, Hypomobility, Impaired flexibility, Improper body mechanics, Decreased mobility, Decreased strength, Increased edema, Impaired sensation  Visit Diagnosis: Muscle weakness (generalized)  Difficulty in walking, not elsewhere classified  Localized edema  Other abnormalities of gait and mobility  Stiffness of right ankle, not elsewhere classified  Stiffness of left knee, not elsewhere classified     Problem List Patient Active Problem List   Diagnosis Date Noted   Left patella fracture 05/10/2021   Closed fracture of lateral portion of right tibial plateau 05/10/2021   Open pilon fracture, right, type III, initial encounter 05/07/2021    [facility] April Ma L Ekaterina Denise, PT, DPT 10/21/2021, 1:04 PM  [facility] [place]  [place], [place], [address] Phone: [phone]   Fax:  [phone]  Name: Gina Martin MRN: [id number] Date of Birth: [birth year]

## 2021-10-25 ENCOUNTER — Ambulatory Visit: Payer: Medicaid Other | Admitting: Physical Therapy

## 2021-10-25 DIAGNOSIS — M6281 Muscle weakness (generalized): Secondary | ICD-10-CM | POA: Diagnosis not present

## 2021-10-25 DIAGNOSIS — R2689 Other abnormalities of gait and mobility: Secondary | ICD-10-CM

## 2021-10-25 DIAGNOSIS — R262 Difficulty in walking, not elsewhere classified: Secondary | ICD-10-CM

## 2021-10-25 DIAGNOSIS — M25662 Stiffness of left knee, not elsewhere classified: Secondary | ICD-10-CM

## 2021-10-25 DIAGNOSIS — M25671 Stiffness of right ankle, not elsewhere classified: Secondary | ICD-10-CM

## 2021-10-25 DIAGNOSIS — R600 Localized edema: Secondary | ICD-10-CM

## 2021-10-27 ENCOUNTER — Ambulatory Visit (HOSPITAL_BASED_OUTPATIENT_CLINIC_OR_DEPARTMENT_OTHER): Payer: Medicaid Other | Admitting: Physical Therapy

## 2021-10-29 ENCOUNTER — Ambulatory Visit (HOSPITAL_BASED_OUTPATIENT_CLINIC_OR_DEPARTMENT_OTHER): Payer: Self-pay | Admitting: Physical Therapy

## 2021-11-04 ENCOUNTER — Ambulatory Visit: Payer: Medicaid Other | Attending: Student | Admitting: Physical Therapy

## 2021-11-04 DIAGNOSIS — R600 Localized edema: Secondary | ICD-10-CM | POA: Insufficient documentation

## 2021-11-04 DIAGNOSIS — M25671 Stiffness of right ankle, not elsewhere classified: Secondary | ICD-10-CM | POA: Insufficient documentation

## 2021-11-04 DIAGNOSIS — M6281 Muscle weakness (generalized): Secondary | ICD-10-CM | POA: Insufficient documentation

## 2021-11-04 DIAGNOSIS — M25662 Stiffness of left knee, not elsewhere classified: Secondary | ICD-10-CM | POA: Insufficient documentation

## 2021-11-04 DIAGNOSIS — R262 Difficulty in walking, not elsewhere classified: Secondary | ICD-10-CM | POA: Insufficient documentation

## 2021-11-04 DIAGNOSIS — R2689 Other abnormalities of gait and mobility: Secondary | ICD-10-CM | POA: Insufficient documentation

## 2021-11-04 NOTE — Therapy (Signed)
[place], [place], [address] Phone: [phone]   Fax:  [phone]  Physical Therapy Treatment  Patient Details  Name: Gina Martin MRN: [id number] Date of Birth: 08-13-1990 Referring Provider (PT): Haddix, Thomasene Lot, MD Rationale for Evaluation and Treatment Rehabilitation   Encounter Date: 11/04/2021   PT End of Session - 11/04/21 1106     Visit Number 18    Number of Visits 24    Date for PT Re-Evaluation 12/02/21    Authorization Type Medicaid    Authorization Time Period 12 visit 09/22/21 to 11/02/21    Authorization - Visit Number 18    Authorization - Number of Visits 24    PT Start Time 1102    PT Stop Time 1145    PT Time Calculation (min) 43 min    Activity Tolerance Patient tolerated treatment well    Behavior During Therapy [facility] for tasks assessed/performed             Past Medical History:  Diagnosis Date   Asthma    Bipolar 1 disorder ([place])    Hyperlipidemia    Preeclampsia    Schizophrenia ([place])     Past Surgical History:  Procedure Laterality Date   CESAREAN SECTION     EXTERNAL FIXATION LEG Right 05/07/2021   Procedure: EXTERNAL FIXATION AND IRRIGATION AND DEBRIDMENT  PILON;  Surgeon: Shona Needles, MD;  Location: [place];  Service: Orthopedics;  Laterality: Right;   EXTERNAL FIXATION REMOVAL Right 05/10/2021   Procedure: REMOVAL EXTERNAL FIXATION LEG;  Surgeon: Shona Needles, MD;  Location: [place];  Service: Orthopedics;  Laterality: Right;   OPEN REDUCTION INTERNAL FIXATION (ORIF) TIBIA/FIBULA FRACTURE Right 05/10/2021   Procedure: OPEN REDUCTION INTERNAL FIXATION (ORIF) TIBIA/FIBULA FRACTURE;  Surgeon: Shona Needles, MD;  Location: [place];  Service: Orthopedics;  Laterality: Right;   ORIF PATELLA Left 05/07/2021   Procedure: OPEN REDUCTION INTERNAL (ORIF) FIXATION PATELLA;  Surgeon: Shona Needles, MD;  Location: [place];  Service: Orthopedics;  Laterality: Left;   TUBAL  LIGATION      There were no vitals filed for this visit.   Subjective Assessment - 11/04/21 1108     Subjective Pt states R tibia is still not fully healed. Dr. Doreatha Martin has placed her back to 50% PWB. She states he is okay with her still using the cane. Discussed possibility of another surgery for bone graft    Patient is accompained by: Family member    Limitations Standing;Lifting;Walking;House hold activities    How long can you sit comfortably? n/a    How long can you stand comfortably? A few seconds    How long can you walk comfortably? ~40' with RW (can be limited to pain or fatigue)    Patient Stated Goals Walk and return to PLOF    Currently in Pain? Yes    Pain Score 5                 [facility] PT Assessment - 11/04/21 0001       Assessment   Medical Diagnosis Left Patella fx, Right Pilon fx S/P ORIF;    Referring Provider (PT) Haddix, Thomasene Lot, MD    Onset Date/Surgical Date 05/07/21    Hand Dominance Right    Next MD Visit 08/03/21      Precautions   Precaution Comments Pt states she is back to 50% PWB  [facility] Adult PT Treatment/Exercise - 11/04/21 0001       Knee/Hip Exercises: Stretches   Gastroc Stretch Right;30 seconds;Left    Gastroc Stretch Limitations in supine manually provided by PT    Soleus Stretch Right;30 seconds;2 reps    Soleus Stretch Limitations in supine manually provided by PT    Other Knee/Hip Stretches half kneel ankle bend forward/backward weight shift 10x10 sec      Knee/Hip Exercises: Machines for Strengthening   Total Gym Leg Press SL heel raise 2x10 at 6 plates on L, 3 plates on R; soleus heel raise 3 plates on R & L 5G38; SL leg press 5 plates 2x10      Manual Therapy   Manual therapy comments ankle DF MWM in standing x10; Grade III ankle DF talocrural joint and distraction      Ankle Exercises: Seated   BAPS Sitting;Level 4;15 reps    Other Seated Ankle Exercises ankle DF 2x10                        PT Short Term Goals - 07/28/21 1028       PT SHORT TERM GOAL #1   Title Pt will be independent with initial HEP    Time 3    Period Weeks    Status Achieved    Target Date 07/30/21      PT SHORT TERM GOAL #2   Title Pt will improve R ankle ROM to at least within 5 deg of neutral    Time 3    Period Weeks    Status Partially Met    Target Date 07/30/21      PT SHORT TERM GOAL #3   Title Pt will be able to stand on L LE x5 min without use of knee brace    Baseline Able to hop on L LE with RW support    Time 3    Period Weeks    Status Achieved    Target Date 07/30/21      PT SHORT TERM GOAL #4   Title Pt will be able to amb with RW x 150' maintaining weight bearing precautions (R LE NWB) to demo improved household mobility    Baseline Able to amb in/out of clinic >150' with RW R LE NWB    Time 3    Period Weeks    Status Achieved    Target Date 07/30/21               PT Long Term Goals - 11/04/21 1125       PT LONG TERM GOAL #1   Title Pt will be independent with final HEP    Time 6    Period Weeks    Status Partially Met    Target Date 12/02/21      PT LONG TERM GOAL #2   Title Pt will be able to amb at least 1000' with LRAD for improved community mobility    Baseline SPC for limited community distances (~1/2 to 1 block') -- increased distance makes pt too sore    Time 6    Period Weeks    Status Partially Met    Target Date 12/02/21      PT LONG TERM GOAL #3   Title Pt will be able to perform 5x STS without UE support to demo improved functional strength    Baseline able to perform 1 without UEs; 26 sec on 11/04/21  Time 6    Period Weeks    Status Achieved    Target Date 12/02/21      PT LONG TERM GOAL #4   Title Pt will have R = L ankle ROM to be able to ascend/descend steps    Baseline R dorsiflexion remains 5 deg less than L; R ankle = 3 deg DF; L ankle = 15 deg on 11/04/21    Time 6    Period Weeks    Status Partially  Met    Target Date 12/02/21      PT LONG TERM GOAL #5   Title Pt will have improved FOTO score to predicted level    Time 6    Period Weeks    Status Deferred    Target Date 12/02/21                   Plan - 11/04/21 1500     Clinical Impression Statement Pt is back to 50% PWB. Not able to work on stairs at this time and single leg weightbearing/balance. Continued manual work for ankle DF. Less pain than from prior session. Primarily performed exercises on leg press to comply with PWB.    Personal Factors and Comorbidities Age;Fitness;Time since onset of injury/illness/exacerbation;Past/Current Experience    Examination-Activity Limitations Locomotion Level;Transfers;Stairs;Stand;Dressing;Hygiene/Grooming;Bend;Toileting;Lift    Examination-Participation Restrictions Cleaning;Community Activity;Driving;Occupation    Rehab Potential Good    PT Frequency 2x / week    PT Duration 12 weeks    PT Treatment/Interventions ADLs/Self Care Home Management;Aquatic Therapy;Iontophoresis 4mg /ml Dexamethasone;Moist Heat;DME Instruction;Cryotherapy;Electrical Stimulation;Gait training;Stair training;Functional mobility training;Therapeutic activities;Therapeutic exercise;Balance training;Neuromuscular re-education;Manual techniques;Patient/family education;Orthotic Fit/Training;Scar mobilization;Passive range of motion;Dry needling;Taping;Vasopneumatic Device;Joint Manipulations    PT Next Visit Plan Continue aquatics. Continue R ankle ROM, balance/stability, and strengthening, L knee ROM and strengthening. Work on Aeronautical engineer!!    PT Home Exercise Plan Access Code [email or web address]    Consulted and Agree with Plan of Care Patient;Family member/caregiver             Patient will benefit from skilled therapeutic intervention in order to improve the following deficits and impairments:  Decreased range of motion, Difficulty walking, Increased fascial restricitons, Abnormal gait, Increased muscle  spasms, Decreased activity tolerance, Pain, Decreased balance, Hypomobility, Impaired flexibility, Improper body mechanics, Decreased mobility, Decreased strength, Increased edema, Impaired sensation  Visit Diagnosis: Muscle weakness (generalized)  Difficulty in walking, not elsewhere classified  Other abnormalities of gait and mobility  Localized edema  Stiffness of right ankle, not elsewhere classified  Stiffness of left knee, not elsewhere classified     Problem List Patient Active Problem List   Diagnosis Date Noted   Left patella fracture 05/10/2021   Closed fracture of lateral portion of right tibial plateau 05/10/2021   Open pilon fracture, right, type III, initial encounter 05/07/2021    [facility] April Gordy Levan, PT, DPT 11/04/2021, 3:04 PM  [facility] [place], [place], [address] Phone: [phone]   Fax:  [phone]  Name: Gina Martin MRN: [id number] Date of Birth: 1990-06-02

## 2021-11-10 ENCOUNTER — Ambulatory Visit: Payer: Medicaid Other | Admitting: Physical Therapy

## 2021-11-10 DIAGNOSIS — R262 Difficulty in walking, not elsewhere classified: Secondary | ICD-10-CM

## 2021-11-10 DIAGNOSIS — M25662 Stiffness of left knee, not elsewhere classified: Secondary | ICD-10-CM

## 2021-11-10 DIAGNOSIS — R2689 Other abnormalities of gait and mobility: Secondary | ICD-10-CM

## 2021-11-10 DIAGNOSIS — M6281 Muscle weakness (generalized): Secondary | ICD-10-CM

## 2021-11-10 DIAGNOSIS — R600 Localized edema: Secondary | ICD-10-CM

## 2021-11-10 DIAGNOSIS — M25671 Stiffness of right ankle, not elsewhere classified: Secondary | ICD-10-CM

## 2021-11-10 NOTE — Therapy (Signed)
[place], [place], [address] Phone: [phone]   Fax:  [phone]  Physical Therapy Treatment  Patient Details  Name: Gina Martin MRN: [id number] Date of Birth: 21-Aug-1990 Referring Provider (PT): Haddix, Thomasene Lot, MD  Rationale for Evaluation and Treatment Rehabilitation  Encounter Date: 11/10/2021   PT End of Session - 11/10/21 1103     Visit Number 19    Number of Visits 24    Date for PT Re-Evaluation 12/02/21    Authorization Type Medicaid    Authorization Time Period 12 visit 09/22/21 to 11/02/21    Authorization - Visit Number 82    Authorization - Number of Visits 24    PT Start Time 1103    PT Stop Time 1145    PT Time Calculation (min) 42 min    Activity Tolerance Patient tolerated treatment well    Behavior During Therapy WFL for tasks assessed/performed             Past Medical History:  Diagnosis Date   Asthma    Bipolar 1 disorder ([place])    Hyperlipidemia    Preeclampsia    Schizophrenia ([place])     Past Surgical History:  Procedure Laterality Date   CESAREAN SECTION     EXTERNAL FIXATION LEG Right 05/07/2021   Procedure: EXTERNAL FIXATION AND IRRIGATION AND DEBRIDMENT  PILON;  Surgeon: Shona Needles, MD;  Location: [place];  Service: Orthopedics;  Laterality: Right;   EXTERNAL FIXATION REMOVAL Right 05/10/2021   Procedure: REMOVAL EXTERNAL FIXATION LEG;  Surgeon: Shona Needles, MD;  Location: [place];  Service: Orthopedics;  Laterality: Right;   OPEN REDUCTION INTERNAL FIXATION (ORIF) TIBIA/FIBULA FRACTURE Right 05/10/2021   Procedure: OPEN REDUCTION INTERNAL FIXATION (ORIF) TIBIA/FIBULA FRACTURE;  Surgeon: Shona Needles, MD;  Location: [place];  Service: Orthopedics;  Laterality: Right;   ORIF PATELLA Left 05/07/2021   Procedure: OPEN REDUCTION INTERNAL (ORIF) FIXATION PATELLA;  Surgeon: Shona Needles, MD;  Location: [place];  Service: Orthopedics;  Laterality: Left;   TUBAL  LIGATION      There were no vitals filed for this visit.   Subjective Assessment - 11/10/21 1106     Subjective Pt states her R ankle is very stiff. States she hasn't gotten up much.    Patient is accompained by: Family member    Limitations Standing;Lifting;Walking;House hold activities    How long can you sit comfortably? n/a    How long can you stand comfortably? A few seconds    How long can you walk comfortably? ~72' with RW (can be limited to pain or fatigue)    Patient Stated Goals Walk and return to PLOF    Currently in Pain? No/denies                [facility] PT Assessment - 11/10/21 0001       Assessment   Medical Diagnosis Left Patella fx, Right Pilon fx S/P ORIF;    Referring Provider (PT) Haddix, Thomasene Lot, MD    Onset Date/Surgical Date 05/07/21    Hand Dominance Right    Next MD Visit 08/03/21                           [facility] Adult PT Treatment/Exercise - 11/10/21 0001       Knee/Hip Exercises: Stretches   Gastroc Stretch Right;30 seconds;2 reps    Gastroc Stretch Limitations supine and seated  Soleus Stretch Right;30 seconds;2 reps    Soleus Stretch Limitations supine and seated      Knee/Hip Exercises: Aerobic   Recumbent Bike L3 x 10 min      Knee/Hip Exercises: Supine   Other Supine Knee/Hip Exercises R knee bent ankle DF green tband 2x10x3 sec; R knee straight ankle DF with eversion green tband 2x10x3 sec      Manual Therapy   Manual therapy comments ankle DF MWM in supine x10; Grade III ankle DF talocrural joint and distraction      Ankle Exercises: Seated   Toe Raise 20 reps   alternating big toe & little toes   Other Seated Ankle Exercises "short foot" 2x10                     PT Education - 11/10/21 1131     Education Details Discussed working on endurance at home with a bike or under desk pedaler.    Person(s) Educated Patient    Methods Demonstration;Verbal cues;Tactile cues    Comprehension Verbalized  understanding              PT Short Term Goals - 07/28/21 1028       PT SHORT TERM GOAL #1   Title Pt will be independent with initial HEP    Time 3    Period Weeks    Status Achieved    Target Date 07/30/21      PT SHORT TERM GOAL #2   Title Pt will improve R ankle ROM to at least within 5 deg of neutral    Time 3    Period Weeks    Status Partially Met    Target Date 07/30/21      PT SHORT TERM GOAL #3   Title Pt will be able to stand on L LE x5 min without use of knee brace    Baseline Able to hop on L LE with RW support    Time 3    Period Weeks    Status Achieved    Target Date 07/30/21      PT SHORT TERM GOAL #4   Title Pt will be able to amb with RW x 150' maintaining weight bearing precautions (R LE NWB) to demo improved household mobility    Baseline Able to amb in/out of clinic >150' with RW R LE NWB    Time 3    Period Weeks    Status Achieved    Target Date 07/30/21               PT Long Term Goals - 11/04/21 1125       PT LONG TERM GOAL #1   Title Pt will be independent with final HEP    Time 6    Period Weeks    Status Partially Met    Target Date 12/02/21      PT LONG TERM GOAL #2   Title Pt will be able to amb at least 1000' with LRAD for improved community mobility    Baseline SPC for limited community distances (~1/2 to 1 block') -- increased distance makes pt too sore    Time 6    Period Weeks    Status Partially Met    Target Date 12/02/21      PT LONG TERM GOAL #3   Title Pt will be able to perform 5x STS without UE support to demo improved functional strength    Baseline able to  perform 1 without UEs; 26 sec on 11/04/21    Time 6    Period Weeks    Status Achieved    Target Date 12/02/21      PT LONG TERM GOAL #4   Title Pt will have R = L ankle ROM to be able to ascend/descend steps    Baseline R dorsiflexion remains 5 deg less than L; R ankle = 3 deg DF; L ankle = 15 deg on 11/04/21    Time 6    Period Weeks    Status  Partially Met    Target Date 12/02/21      PT LONG TERM GOAL #5   Title Pt will have improved FOTO score to predicted level    Time 6    Period Weeks    Status Deferred    Target Date 12/02/21                   Plan - 11/10/21 1128     Clinical Impression Statement Tight ankle DF due to less activity. Heavy manual work to improve DF and stretching of gastroc/soleus. Session focused on improving endurance. Tolerated riding bike to 10 min this session. Continued foot strengthening exercises.    Personal Factors and Comorbidities Age;Fitness;Time since onset of injury/illness/exacerbation;Past/Current Experience    Examination-Activity Limitations Locomotion Level;Transfers;Stairs;Stand;Dressing;Hygiene/Grooming;Bend;Toileting;Lift    Examination-Participation Restrictions Cleaning;Community Activity;Driving;Occupation    Rehab Potential Good    PT Frequency 2x / week    PT Duration 12 weeks    PT Treatment/Interventions ADLs/Self Care Home Management;Aquatic Therapy;Iontophoresis 4mg /ml Dexamethasone;Moist Heat;DME Instruction;Cryotherapy;Electrical Stimulation;Gait training;Stair training;Functional mobility training;Therapeutic activities;Therapeutic exercise;Balance training;Neuromuscular re-education;Manual techniques;Patient/family education;Orthotic Fit/Training;Scar mobilization;Passive range of motion;Dry needling;Taping;Vasopneumatic Device;Joint Manipulations    PT Next Visit Plan Continue aquatics. Continue R ankle ROM, balance/stability, and strengthening, L knee ROM and strengthening. Work on Aeronautical engineer!!    PT Home Exercise Plan Access Code [email or web address]    Consulted and Agree with Plan of Care Patient;Family member/caregiver             Patient will benefit from skilled therapeutic intervention in order to improve the following deficits and impairments:  Decreased range of motion, Difficulty walking, Increased fascial restricitons, Abnormal gait, Increased muscle  spasms, Decreased activity tolerance, Pain, Decreased balance, Hypomobility, Impaired flexibility, Improper body mechanics, Decreased mobility, Decreased strength, Increased edema, Impaired sensation  Visit Diagnosis: Muscle weakness (generalized)  Difficulty in walking, not elsewhere classified  Other abnormalities of gait and mobility  Localized edema  Stiffness of right ankle, not elsewhere classified  Stiffness of left knee, not elsewhere classified     Problem List Patient Active Problem List   Diagnosis Date Noted   Left patella fracture 05/10/2021   Closed fracture of lateral portion of right tibial plateau 05/10/2021   Open pilon fracture, right, type III, initial encounter 05/07/2021    [facility] April Gordy Levan, PT, DPT 11/10/2021, 11:38 AM  [facility] [place], [place], [address] Phone: [phone]   Fax:  [phone]  Name: Lucette Kratz MRN: [id number] Date of Birth: 1991/04/11

## 2021-11-15 ENCOUNTER — Ambulatory Visit: Payer: Medicaid Other | Admitting: Physical Therapy

## 2021-11-17 ENCOUNTER — Encounter: Payer: Self-pay | Admitting: Physical Therapy

## 2021-11-17 ENCOUNTER — Ambulatory Visit: Payer: Medicaid Other | Admitting: Physical Therapy

## 2021-11-17 DIAGNOSIS — M6281 Muscle weakness (generalized): Secondary | ICD-10-CM | POA: Diagnosis not present

## 2021-11-17 DIAGNOSIS — R2689 Other abnormalities of gait and mobility: Secondary | ICD-10-CM

## 2021-11-17 DIAGNOSIS — R600 Localized edema: Secondary | ICD-10-CM

## 2021-11-17 DIAGNOSIS — R262 Difficulty in walking, not elsewhere classified: Secondary | ICD-10-CM

## 2021-11-17 DIAGNOSIS — M25662 Stiffness of left knee, not elsewhere classified: Secondary | ICD-10-CM

## 2021-11-17 DIAGNOSIS — M25671 Stiffness of right ankle, not elsewhere classified: Secondary | ICD-10-CM

## 2021-11-17 NOTE — Therapy (Signed)
OUTPATIENT PHYSICAL THERAPY TREATMENT NOTE   Patient Name: Gina Martin MRN: [id number] DOB:Apr 13, 1991, 31 y.o., female Today's Date: 11/17/2021  PCP: N/A REFERRING PROVIDER: Katha Hamming   PT End of Session - 11/17/21 1148     Visit Number 20    Number of Visits 24    Date for PT Re-Evaluation 12/02/21    Authorization Type Medicaid    Authorization Time Period 12 visit 09/22/21 to 11/02/21    Authorization - Visit Number 20    Authorization - Number of Visits 24    PT Start Time 1147    PT Stop Time 1230    PT Time Calculation (min) 43 min    Activity Tolerance Patient tolerated treatment well    Behavior During Therapy WFL for tasks assessed/performed             Past Medical History:  Diagnosis Date   Asthma    Bipolar 1 disorder ([place])    Hyperlipidemia    Preeclampsia    Schizophrenia ([place])    Past Surgical History:  Procedure Laterality Date   CESAREAN SECTION     EXTERNAL FIXATION LEG Right 05/07/2021   Procedure: EXTERNAL FIXATION AND IRRIGATION AND DEBRIDMENT  PILON;  Surgeon: Shona Needles, MD;  Location: [place];  Service: Orthopedics;  Laterality: Right;   EXTERNAL FIXATION REMOVAL Right 05/10/2021   Procedure: REMOVAL EXTERNAL FIXATION LEG;  Surgeon: Shona Needles, MD;  Location: [place];  Service: Orthopedics;  Laterality: Right;   OPEN REDUCTION INTERNAL FIXATION (ORIF) TIBIA/FIBULA FRACTURE Right 05/10/2021   Procedure: OPEN REDUCTION INTERNAL FIXATION (ORIF) TIBIA/FIBULA FRACTURE;  Surgeon: Shona Needles, MD;  Location: [place];  Service: Orthopedics;  Laterality: Right;   ORIF PATELLA Left 05/07/2021   Procedure: OPEN REDUCTION INTERNAL (ORIF) FIXATION PATELLA;  Surgeon: Shona Needles, MD;  Location: [place];  Service: Orthopedics;  Laterality: Left;   TUBAL LIGATION     Patient Active Problem List   Diagnosis Date Noted   Left patella fracture 05/10/2021   Closed fracture of lateral portion of right tibial plateau 05/10/2021   Open pilon fracture,  right, type III, initial encounter 05/07/2021    REFERRING DIAG: L patella fx, R pilon fx s/p ORIF  THERAPY DIAG:  Muscle weakness (generalized)  Difficulty in walking, not elsewhere classified  Other abnormalities of gait and mobility  Localized edema  Stiffness of right ankle, not elsewhere classified  Stiffness of left knee, not elsewhere classified  Rationale for Evaluation and Treatment Rehabilitation  PERTINENT HISTORY: Last surgery 05/10/21 ankle surgery and 05/07/21 for her L knee and cleaned out her R ankle   PRECAUTIONS: 50% PWB R LE  SUBJECTIVE: Pt states her R LE is sore and aching. States she has not been good at following 50% -- has been cooking and standing.   PAIN:  Are you having pain? Yes: NPRS scale: 5/10 Pain location: Throughout calf/ankle Pain description: Aching Aggravating factors: Increased time standing Relieving factors: Rest, ice, icy hot     TODAY'S TREATMENT:  11/17/21 Seated:  Black tband ankle PF, DF, ev, inv 2x10  Sidelying:  Black tband clamshell 2x10  Standing:  Staggered stance for 50% weightbearing (R LE in front)  arch lifting 2x10  Ankle DF 2x10  Manual therapy: STM & IASTM throughout gastroc/soleus, peroneals, ant tib Grade II to III joint mobilization for ankle DF and distraction Ankle PROM all directions Self IASTM with foam foller and massage stick   PATIENT EDUCATION: Education details: Discussed progressing  HEP to black tband Person educated: Patient Education method: Explanation and Demonstration Education comprehension: verbalized understanding   HOME EXERCISE PROGRAM: Access Code [email or web address]    PT Short Term Goals - 07/28/21 1028       PT SHORT TERM GOAL #1   Title Pt will be independent with initial HEP    Time 3    Period Weeks    Status Achieved    Target Date 07/30/21      PT SHORT TERM GOAL #2   Title Pt will improve R ankle ROM to at least within 5 deg of neutral    Time 3    Period Weeks     Status Partially Met    Target Date 07/30/21      PT SHORT TERM GOAL #3   Title Pt will be able to stand on L LE x5 min without use of knee brace    Baseline Able to hop on L LE with RW support    Time 3    Period Weeks    Status Achieved    Target Date 07/30/21      PT SHORT TERM GOAL #4   Title Pt will be able to amb with RW x 150' maintaining weight bearing precautions (R LE NWB) to demo improved household mobility    Baseline Able to amb in/out of clinic >150' with RW R LE NWB    Time 3    Period Weeks    Status Achieved    Target Date 07/30/21              PT Long Term Goals - 11/04/21 1125       PT LONG TERM GOAL #1   Title Pt will be independent with final HEP    Time 6    Period Weeks    Status Partially Met    Target Date 12/02/21      PT LONG TERM GOAL #2   Title Pt will be able to amb at least 1000' with LRAD for improved community mobility    Baseline SPC for limited community distances (~1/2 to 1 block') -- increased distance makes pt too sore    Time 6    Period Weeks    Status Partially Met    Target Date 12/02/21      PT LONG TERM GOAL #3   Title Pt will be able to perform 5x STS without UE support to demo improved functional strength    Baseline able to perform 1 without UEs; 26 sec on 11/04/21    Time 6    Period Weeks    Status Achieved    Target Date 12/02/21      PT LONG TERM GOAL #4   Title Pt will have R = L ankle ROM to be able to ascend/descend steps    Baseline R dorsiflexion remains 5 deg less than L; R ankle = 3 deg DF; L ankle = 15 deg on 11/04/21    Time 6    Period Weeks    Status Partially Met    Target Date 12/02/21      PT LONG TERM GOAL #5   Title Pt will have improved FOTO score to predicted level    Time 6    Period Weeks    Status Deferred    Target Date 12/02/21              Plan - 11/17/21 1242     Clinical Impression Statement Less  ankle DF tightness; however, pt with increased L LE soreness. Increased  manual work this session. Educated pt on using foam roll and/or massage stick to help with it at home. Progressed exercises to black tband.    Personal Factors and Comorbidities Age;Fitness;Time since onset of injury/illness/exacerbation;Past/Current Experience    Examination-Activity Limitations Locomotion Level;Transfers;Stairs;Stand;Dressing;Hygiene/Grooming;Bend;Toileting;Lift    Examination-Participation Restrictions Cleaning;Community Activity;Driving;Occupation    Rehab Potential Good    PT Frequency 2x / week    PT Duration 12 weeks    PT Treatment/Interventions ADLs/Self Care Home Management;Aquatic Therapy;Iontophoresis 43m/ml Dexamethasone;Moist Heat;DME Instruction;Cryotherapy;Electrical Stimulation;Gait training;Stair training;Functional mobility training;Therapeutic activities;Therapeutic exercise;Balance training;Neuromuscular re-education;Manual techniques;Patient/family education;Orthotic Fit/Training;Scar mobilization;Passive range of motion;Dry needling;Taping;Vasopneumatic Device;Joint Manipulations    PT Next Visit Plan Continue aquatics. Continue R ankle ROM, balance/stability, and strengthening, L knee ROM and strengthening. Work on gAeronautical engineer!    PT Home Exercise Plan Access Code [email or web address]   Consulted and Agree with Plan of Care Patient;Family member/caregiver                April MBeatrix ShipperNHebron PT, DPT 11/17/2021, 12:44 PM

## 2021-11-22 ENCOUNTER — Encounter: Payer: Self-pay | Admitting: Physical Therapy

## 2021-11-22 ENCOUNTER — Ambulatory Visit: Payer: Medicaid Other | Admitting: Physical Therapy

## 2021-11-22 DIAGNOSIS — M25662 Stiffness of left knee, not elsewhere classified: Secondary | ICD-10-CM

## 2021-11-22 DIAGNOSIS — R2689 Other abnormalities of gait and mobility: Secondary | ICD-10-CM

## 2021-11-22 DIAGNOSIS — R600 Localized edema: Secondary | ICD-10-CM

## 2021-11-22 DIAGNOSIS — M25671 Stiffness of right ankle, not elsewhere classified: Secondary | ICD-10-CM

## 2021-11-22 DIAGNOSIS — M6281 Muscle weakness (generalized): Secondary | ICD-10-CM | POA: Diagnosis not present

## 2021-11-22 DIAGNOSIS — R262 Difficulty in walking, not elsewhere classified: Secondary | ICD-10-CM

## 2021-11-22 NOTE — Therapy (Signed)
OUTPATIENT PHYSICAL THERAPY TREATMENT NOTE   Patient Name: Gina Martin MRN: [id number] DOB:[birth year], 31 y.o., female Today's Date: 11/22/2021  PCP: N/A REFERRING PROVIDER: Katha Hamming   PT End of Session - 11/22/21 1145     Visit Number 21    Number of Visits 24    Date for PT Re-Evaluation 12/02/21    Authorization Type Medicaid    Authorization - Visit Number 21    Authorization - Number of Visits 24    PT Start Time 4174    PT Stop Time 1225    PT Time Calculation (min) 39 min    Activity Tolerance Patient tolerated treatment well    Behavior During Therapy WFL for tasks assessed/performed              Past Medical History:  Diagnosis Date   Asthma    Bipolar 1 disorder ([place])    Hyperlipidemia    Preeclampsia    Schizophrenia ([place])    Past Surgical History:  Procedure Laterality Date   CESAREAN SECTION     EXTERNAL FIXATION LEG Right 05/07/2021   Procedure: EXTERNAL FIXATION AND IRRIGATION AND DEBRIDMENT  PILON;  Surgeon: Shona Needles, MD;  Location: [place];  Service: Orthopedics;  Laterality: Right;   EXTERNAL FIXATION REMOVAL Right 05/10/2021   Procedure: REMOVAL EXTERNAL FIXATION LEG;  Surgeon: Shona Needles, MD;  Location: [place];  Service: Orthopedics;  Laterality: Right;   OPEN REDUCTION INTERNAL FIXATION (ORIF) TIBIA/FIBULA FRACTURE Right 05/10/2021   Procedure: OPEN REDUCTION INTERNAL FIXATION (ORIF) TIBIA/FIBULA FRACTURE;  Surgeon: Shona Needles, MD;  Location: [place];  Service: Orthopedics;  Laterality: Right;   ORIF PATELLA Left 05/07/2021   Procedure: OPEN REDUCTION INTERNAL (ORIF) FIXATION PATELLA;  Surgeon: Shona Needles, MD;  Location: [place];  Service: Orthopedics;  Laterality: Left;   TUBAL LIGATION     Patient Active Problem List   Diagnosis Date Noted   Left patella fracture 05/10/2021   Closed fracture of lateral portion of right tibial plateau 05/10/2021   Open pilon fracture, right, type III, initial encounter 05/07/2021     REFERRING DIAG: L patella fx, R pilon fx s/p ORIF  THERAPY DIAG:  Muscle weakness (generalized)  Difficulty in walking, not elsewhere classified  Other abnormalities of gait and mobility  Localized edema  Stiffness of right ankle, not elsewhere classified  Stiffness of left knee, not elsewhere classified  Rationale for Evaluation and Treatment Rehabilitation  PERTINENT HISTORY: Last surgery 05/10/21 ankle surgery and 05/07/21 for her L knee and cleaned out her R ankle   PRECAUTIONS: 50% PWB R LE  SUBJECTIVE: Pt states she had a rough weekend -- her dog chewed up her new shoes. Pt states her leg was hurting yesterday when it rained.  PAIN:  Are you having pain? Yes: NPRS scale: 3/10 Pain location: Calf Pain description: Aching Aggravating factors: Increased time standing Relieving factors: Rest, ice, icy hot     TODAY'S TREATMENT:  11/22/21 Nu Step L5 x 7 min LEs only  Standing (Staggered stance for 50% weightbearing R LE in front):  Arch lifting 2x10  Ankle DF 2x10  Ankle PF 2x10  Toe splaying 2x10  Heel down with knee bend 2x10  Manual therapy: STM & IASTM throughout gastroc/soleus, peroneals, ant tib Grade II to III joint mobilization for ankle DF and distraction Ankle PROM all directions Skilled assessment and palpation for TPDN Ionto patch 4 hr, dexa on distal calf  Trigger Point Dry-Needling  Treatment instructions: Expect  mild to moderate muscle soreness. S/S of pneumothorax if dry needled over a lung field, and to seek immediate medical attention should they occur. Patient verbalized understanding of these instructions and education.  Patient Consent Given: Yes Education handout provided: Yes Muscles treated: Soleus/gastroc Electrical stimulation performed: No Parameters: N/A Treatment response/outcome: Increased muscle length to palpation    11/17/21 Seated:  Black tband ankle PF, DF, ev, inv 2x10  Sidelying:  Black tband clamshell  2x10  Standing:  Staggered stance for 50% weightbearing (R LE in front)  arch lifting 2x10  Ankle DF 2x10  Manual therapy: STM & IASTM throughout gastroc/soleus, peroneals, ant tib Grade II to III joint mobilization for ankle DF and distraction Ankle PROM all directions Self IASTM with foam foller and massage stick   PATIENT EDUCATION: Education details: TPDN Person educated: Patient Education method: Customer service manager Education comprehension: verbalized understanding   HOME EXERCISE PROGRAM: Access Code [email or web address]    PT Short Term Goals - 07/28/21 1028       PT SHORT TERM GOAL #1   Title Pt will be independent with initial HEP    Time 3    Period Weeks    Status Achieved    Target Date 07/30/21      PT SHORT TERM GOAL #2   Title Pt will improve R ankle ROM to at least within 5 deg of neutral    Time 3    Period Weeks    Status Partially Met    Target Date 07/30/21      PT SHORT TERM GOAL #3   Title Pt will be able to stand on L LE x5 min without use of knee brace    Baseline Able to hop on L LE with RW support    Time 3    Period Weeks    Status Achieved    Target Date 07/30/21      PT SHORT TERM GOAL #4   Title Pt will be able to amb with RW x 150' maintaining weight bearing precautions (R LE NWB) to demo improved household mobility    Baseline Able to amb in/out of clinic >150' with RW R LE NWB    Time 3    Period Weeks    Status Achieved    Target Date 07/30/21              PT Long Term Goals - 11/04/21 1125       PT LONG TERM GOAL #1   Title Pt will be independent with final HEP    Time 6    Period Weeks    Status Partially Met    Target Date 12/02/21      PT LONG TERM GOAL #2   Title Pt will be able to amb at least 1000' with LRAD for improved community mobility    Baseline SPC for limited community distances (~1/2 to 1 block') -- increased distance makes pt too sore    Time 6    Period Weeks    Status Partially Met     Target Date 12/02/21      PT LONG TERM GOAL #3   Title Pt will be able to perform 5x STS without UE support to demo improved functional strength    Baseline able to perform 1 without UEs; 26 sec on 11/04/21    Time 6    Period Weeks    Status Achieved    Target Date 12/02/21      PT  LONG TERM GOAL #4   Title Pt will have R = L ankle ROM to be able to ascend/descend steps    Baseline R dorsiflexion remains 5 deg less than L; R ankle = 3 deg DF; L ankle = 15 deg on 11/04/21    Time 6    Period Weeks    Status Partially Met    Target Date 12/02/21      PT LONG TERM GOAL #5   Title Pt will have improved FOTO score to predicted level    Time 6    Period Weeks    Status Deferred    Target Date 12/02/21              Plan - 11/22/21 1237     Clinical Impression Statement Continued to work on improving ankle DF. Increased soleus tightness this sessioin addressed with TPDN. Continued staggered standing to maintaining 50% R PWB for ankle exercises.    Personal Factors and Comorbidities Age;Fitness;Time since onset of injury/illness/exacerbation;Past/Current Experience    Examination-Activity Limitations Locomotion Level;Transfers;Stairs;Stand;Dressing;Hygiene/Grooming;Bend;Toileting;Lift    Examination-Participation Restrictions Cleaning;Community Activity;Driving;Occupation    Rehab Potential Good    PT Frequency 2x / week    PT Duration 12 weeks    PT Treatment/Interventions ADLs/Self Care Home Management;Aquatic Therapy;Iontophoresis 4mg /ml Dexamethasone;Moist Heat;DME Instruction;Cryotherapy;Electrical Stimulation;Gait training;Stair training;Functional mobility training;Therapeutic activities;Therapeutic exercise;Balance training;Neuromuscular re-education;Manual techniques;Patient/family education;Orthotic Fit/Training;Scar mobilization;Passive range of motion;Dry needling;Taping;Vasopneumatic Device;Joint Manipulations    PT Next Visit Plan Continue aquatics. Continue R ankle ROM,  balance/stability, and strengthening, L knee ROM and strengthening. Work on Aeronautical engineer!!    PT Home Exercise Plan Access Code [email or web address]    Consulted and Agree with Plan of Care Patient;Family member/caregiver                Selenia Mihok April Ma L [place], PT, DPT 11/22/2021, 12:37 PM

## 2021-11-24 ENCOUNTER — Ambulatory Visit: Payer: Medicaid Other | Admitting: Physical Therapy

## 2021-11-30 ENCOUNTER — Ambulatory Visit: Payer: Medicaid Other | Attending: Student | Admitting: Physical Therapy

## 2021-11-30 ENCOUNTER — Encounter: Payer: Self-pay | Admitting: Physical Therapy

## 2021-11-30 DIAGNOSIS — R262 Difficulty in walking, not elsewhere classified: Secondary | ICD-10-CM | POA: Diagnosis present

## 2021-11-30 DIAGNOSIS — M6281 Muscle weakness (generalized): Secondary | ICD-10-CM | POA: Insufficient documentation

## 2021-11-30 DIAGNOSIS — R600 Localized edema: Secondary | ICD-10-CM | POA: Diagnosis present

## 2021-11-30 DIAGNOSIS — R2689 Other abnormalities of gait and mobility: Secondary | ICD-10-CM | POA: Diagnosis present

## 2021-11-30 NOTE — Therapy (Signed)
OUTPATIENT PHYSICAL THERAPY TREATMENT NOTE and ReCertification   Patient Name: Gina Martin MRN: [id number] DOB:08-08-1990, 31 y.o., female Today's Date: 11/30/2021  PCP: N/A REFERRING PROVIDER: Katha Hamming   PT End of Session - 11/30/21 1137     Visit Number 22    Number of Visits 30    Date for PT Re-Evaluation 12/22/21    Authorization Type Medicaid    Authorization Time Period 12 visits 11/04/21 - 12/22/21    Authorization - Visit Number 4    Authorization - Number of Visits 12    PT Start Time 1115   pt arrived late   PT Stop Time 1145    PT Time Calculation (min) 30 min    Activity Tolerance Patient tolerated treatment well    Behavior During Therapy WFL for tasks assessed/performed               Past Medical History:  Diagnosis Date   Asthma    Bipolar 1 disorder ([place])    Hyperlipidemia    Preeclampsia    Schizophrenia ([place])    Past Surgical History:  Procedure Laterality Date   CESAREAN SECTION     EXTERNAL FIXATION LEG Right 05/07/2021   Procedure: EXTERNAL FIXATION AND IRRIGATION AND DEBRIDMENT  PILON;  Surgeon: Shona Needles, MD;  Location: [place];  Service: Orthopedics;  Laterality: Right;   EXTERNAL FIXATION REMOVAL Right 05/10/2021   Procedure: REMOVAL EXTERNAL FIXATION LEG;  Surgeon: Shona Needles, MD;  Location: [place];  Service: Orthopedics;  Laterality: Right;   OPEN REDUCTION INTERNAL FIXATION (ORIF) TIBIA/FIBULA FRACTURE Right 05/10/2021   Procedure: OPEN REDUCTION INTERNAL FIXATION (ORIF) TIBIA/FIBULA FRACTURE;  Surgeon: Shona Needles, MD;  Location: [place];  Service: Orthopedics;  Laterality: Right;   ORIF PATELLA Left 05/07/2021   Procedure: OPEN REDUCTION INTERNAL (ORIF) FIXATION PATELLA;  Surgeon: Shona Needles, MD;  Location: [place];  Service: Orthopedics;  Laterality: Left;   TUBAL LIGATION     Patient Active Problem List   Diagnosis Date Noted   Left patella fracture 05/10/2021   Closed fracture of lateral portion of right tibial  plateau 05/10/2021   Open pilon fracture, right, type III, initial encounter 05/07/2021    REFERRING DIAG: L patella fx, R pilon fx s/p ORIF  THERAPY DIAG:  Muscle weakness (generalized)  Difficulty in walking, not elsewhere classified  Other abnormalities of gait and mobility  Localized edema  Rationale for Evaluation and Treatment Rehabilitation  PERTINENT HISTORY: Last surgery 05/10/21 ankle surgery and 05/07/21 for her L knee and cleaned out her R ankle   PRECAUTIONS: 50% PWB R LE  SUBJECTIVE: Pt states as long as she is wearing shoes she doesn't feel pain, just pressure  PAIN:  Are you having pain?No     TODAY'S TREATMENT:  11/30/21 Gait training and wt shifting with focus on improving Rt DF Standing (50% wt bearing Rt):   Arch lifting 2x10  Ankle DF 2x10  Ankle PF 2x10  Toe splaying 2x10  Heel down with knee bend 2x10  Manual: STM and IASTM gastroc/soleus Ankle PROM all directions  LOWER EXTREMITY ROM:     Active  Right 8/1 Left 8/1  Hip flexion    Hip extension    Hip abduction    Hip adduction    Hip internal rotation    Hip external rotation    Knee flexion    Knee extension    Ankle dorsiflexion 4 15  Ankle plantarflexion    Ankle inversion  Ankle eversion     (Blank rows = not tested)   11/22/21 Nu Step L5 x 7 min LEs only  Standing (Staggered stance for 50% weightbearing R LE in front):  Arch lifting 2x10  Ankle DF 2x10  Ankle PF 2x10  Toe splaying 2x10  Heel down with knee bend 2x10  Manual therapy: STM & IASTM throughout gastroc/soleus, peroneals, ant tib Grade II to III joint mobilization for ankle DF and distraction Ankle PROM all directions Skilled assessment and palpation for TPDN Ionto patch 4 hr, dexa on distal calf  Trigger Point Dry-Needling  Treatment instructions: Expect mild to moderate muscle soreness. S/S of pneumothorax if dry needled over a lung field, and to seek immediate medical attention should they occur.  Patient verbalized understanding of these instructions and education.  Patient Consent Given: Yes Education handout provided: Yes Muscles treated: Soleus/gastroc Electrical stimulation performed: No Parameters: N/A Treatment response/outcome: Increased muscle length to palpation    PATIENT EDUCATION: Education details: TPDN Person educated: Patient Education method: Customer service manager Education comprehension: verbalized understanding   HOME EXERCISE PROGRAM: Access Code [email or web address]    PT Short Term Goals - 07/28/21 1028       PT SHORT TERM GOAL #1   Title Pt will be independent with initial HEP    Time 3    Period Weeks    Status Achieved    Target Date 07/30/21      PT SHORT TERM GOAL #2   Title Pt will improve R ankle ROM to at least within 5 deg of neutral    Time 3    Period Weeks    Status Partially Met    Target Date 07/30/21      PT SHORT TERM GOAL #3   Title Pt will be able to stand on L LE x5 min without use of knee brace    Baseline Able to hop on L LE with RW support    Time 3    Period Weeks    Status Achieved    Target Date 07/30/21      PT SHORT TERM GOAL #4   Title Pt will be able to amb with RW x 150' maintaining weight bearing precautions (R LE NWB) to demo improved household mobility    Baseline Able to amb in/out of clinic >150' with RW R LE NWB    Time 3    Period Weeks    Status Achieved    Target Date 07/30/21              PT Long Term Goals - 11/04/21 1125       PT LONG TERM GOAL #1   Title Pt will be independent with final HEP    Time 6    Period Weeks    Status Partially Met    Target Date 12/02/21      PT LONG TERM GOAL #2   Title Pt will be able to amb at least 1000' with LRAD for improved community mobility    Baseline SPC for limited community distances (~1/2 to 1 block') -- increased distance makes pt too sore    Time 6    Period Weeks    Status Met   Target Date 12/02/21      PT LONG TERM GOAL #3    Title Pt will be able to perform 5x STS without UE support to demo improved functional strength    Baseline able to perform 1 without UEs; 26 sec on  11/04/21    Time 6    Period Weeks    Status Achieved    Target Date 12/02/21      PT LONG TERM GOAL #4   Title Pt will have R = L ankle ROM to be able to ascend/descend steps    Baseline R dorsiflexion remains 5 deg less than L; R ankle = 4 deg DF; L ankle = 15 deg on 11/30/21    Time 6    Period Weeks    Status Partially Met    Target Date 12/22/21      PT LONG TERM GOAL #5   Title Pt will have improved FOTO score to predicted level    Time 6    Period Weeks    Status Deferred    Target Date 12/22/21              Plan - 11/30/21 1143     Clinical Impression Statement Pt continiues with decreased DF and impaired gait mechanics. She will continue to benefit from skilled PT to work towards goals and improve functional mobility    PT Next Visit Plan Continue aquatics. Continue R ankle ROM, balance/stability, and strengthening, L knee ROM and strengthening. Work on Aeronautical engineer!!    PT Home Exercise Plan Access Code [email or web address]    Consulted and Agree with Plan of Care Patient;Family member/caregiver                 Abilene Mcphee, PT, DPT 11/30/2021, 11:44 AM

## 2021-12-02 ENCOUNTER — Encounter (HOSPITAL_BASED_OUTPATIENT_CLINIC_OR_DEPARTMENT_OTHER): Payer: Self-pay | Admitting: Physical Therapy

## 2021-12-02 ENCOUNTER — Ambulatory Visit (HOSPITAL_BASED_OUTPATIENT_CLINIC_OR_DEPARTMENT_OTHER): Payer: Medicaid Other | Attending: Student | Admitting: Physical Therapy

## 2021-12-02 DIAGNOSIS — R600 Localized edema: Secondary | ICD-10-CM | POA: Diagnosis present

## 2021-12-02 DIAGNOSIS — R262 Difficulty in walking, not elsewhere classified: Secondary | ICD-10-CM | POA: Diagnosis present

## 2021-12-02 DIAGNOSIS — M6281 Muscle weakness (generalized): Secondary | ICD-10-CM | POA: Diagnosis present

## 2021-12-02 DIAGNOSIS — R2689 Other abnormalities of gait and mobility: Secondary | ICD-10-CM | POA: Insufficient documentation

## 2021-12-02 NOTE — Therapy (Addendum)
OUTPATIENT PHYSICAL THERAPY TREATMENT NOTE AND DISCHARGE   Patient Name: Gina Martin MRN: [id number] DOB:July 09, 1990, 31 y.o., female Today's Date: 12/02/2021  PCP: N/A REFERRING PROVIDER: Katha Hamming   PT End of Session - 12/02/21 1050     Visit Number 23    Number of Visits 30    Date for PT Re-Evaluation 12/22/21    Authorization Type Medicaid    Authorization Time Period 12 visits 11/04/21 - 12/22/21    Authorization - Visit Number 5    Authorization - Number of Visits 12    PT Start Time 1033    PT Stop Time 1111    PT Time Calculation (min) 38 min    Activity Tolerance Patient tolerated treatment well    Behavior During Therapy WFL for tasks assessed/performed               Past Medical History:  Diagnosis Date   Asthma    Bipolar 1 disorder ([place])    Hyperlipidemia    Preeclampsia    Schizophrenia ([place])    Past Surgical History:  Procedure Laterality Date   CESAREAN SECTION     EXTERNAL FIXATION LEG Right 05/07/2021   Procedure: EXTERNAL FIXATION AND IRRIGATION AND DEBRIDMENT  PILON;  Surgeon: Shona Needles, MD;  Location: [place];  Service: Orthopedics;  Laterality: Right;   EXTERNAL FIXATION REMOVAL Right 05/10/2021   Procedure: REMOVAL EXTERNAL FIXATION LEG;  Surgeon: Shona Needles, MD;  Location: [place];  Service: Orthopedics;  Laterality: Right;   OPEN REDUCTION INTERNAL FIXATION (ORIF) TIBIA/FIBULA FRACTURE Right 05/10/2021   Procedure: OPEN REDUCTION INTERNAL FIXATION (ORIF) TIBIA/FIBULA FRACTURE;  Surgeon: Shona Needles, MD;  Location: [place];  Service: Orthopedics;  Laterality: Right;   ORIF PATELLA Left 05/07/2021   Procedure: OPEN REDUCTION INTERNAL (ORIF) FIXATION PATELLA;  Surgeon: Shona Needles, MD;  Location: [place];  Service: Orthopedics;  Laterality: Left;   TUBAL LIGATION     Patient Active Problem List   Diagnosis Date Noted   Left patella fracture 05/10/2021   Closed fracture of lateral portion of right tibial plateau 05/10/2021   Open  pilon fracture, right, type III, initial encounter 05/07/2021    REFERRING DIAG: L patella fx, R pilon fx s/p ORIF  THERAPY DIAG:  Muscle weakness (generalized)  Difficulty in walking, not elsewhere classified  Other abnormalities of gait and mobility  Localized edema  Rationale for Evaluation and Treatment Rehabilitation  PERTINENT HISTORY: Last surgery 05/10/21 ankle surgery and 05/07/21 for her L knee and cleaned out her R ankle   PRECAUTIONS: 50% PWB R LE  SUBJECTIVE: Pt states she "don't want to be 50% WB"; voices frustration with slow progress.  She reports she mostly does furniture walking at home and uses WC in community.  She states she only has pain when she wears a shoe to walk (foot is still sensitive) and when it's raining. Discomfort is on outside of ankle where hardware is (she points to area).   PAIN:  Are you having pain? No  TODAY'S TREATMENT:  12/02/21: Pt seen for aquatic therapy today.  Treatment took place in water 4 ft 8" in depth at the [facility]. Temp of water was 91.  Pt entered/exited the pool via stairs with step-to pattern with bilat rail. Pt given RW to get to/from pool from [facility] and bathroom to help maintain 50% WB status.  Reviewed 50% WB status with use of RW short distance.   Without UE support:  *  forward gait - cues for neutral heel strike (not inverting foot) and weight shift towards great toe; level shoulders at stance * backward gait - cues for neutral foot position and even step length * side stepping - adjusting step length and varying speed, cues for neutral foot position * forward high knee marching * forward walking kicks with DF after ext of knee * Holding onto wall: heel raises x 20;  Rt hip abdct x 20 * Rt calf stretch (runner's stretch holding wall- in 93ft 8" water) * squats, allowing knees, hips and ankles to flex  Pt requires the buoyancy and hydrostatic pressure of water for support, and to offload joints by  unweighting joint load by at least 50 % in navel deep water and by at least 75-80% in chest to neck deep water.  Viscosity of the water is needed for resistance of strengthening. Water current perturbations provides challenge to standing balance requiring increased core activation.   11/30/21 Gait training and wt shifting with focus on improving Rt DF Standing (50% wt bearing Rt):   Arch lifting 2x10  Ankle DF 2x10  Ankle PF 2x10  Toe splaying 2x10  Heel down with knee bend 2x10  Manual: STM and IASTM gastroc/soleus Ankle PROM all directions  LOWER EXTREMITY ROM:     Active  Right 8/1 Left 8/1  Hip flexion    Hip extension    Hip abduction    Hip adduction    Hip internal rotation    Hip external rotation    Knee flexion    Knee extension    Ankle dorsiflexion 4 15  Ankle plantarflexion    Ankle inversion    Ankle eversion     (Blank rows = not tested)   11/22/21 Nu Step L5 x 7 min LEs only  Standing (Staggered stance for 50% weightbearing R LE in front):  Arch lifting 2x10  Ankle DF 2x10  Ankle PF 2x10  Toe splaying 2x10  Heel down with knee bend 2x10  Manual therapy: STM & IASTM throughout gastroc/soleus, peroneals, ant tib Grade II to III joint mobilization for ankle DF and distraction Ankle PROM all directions Skilled assessment and palpation for TPDN Ionto patch 4 hr, dexa on distal calf  Trigger Point Dry-Needling  Treatment instructions: Expect mild to moderate muscle soreness. S/S of pneumothorax if dry needled over a lung field, and to seek immediate medical attention should they occur. Patient verbalized understanding of these instructions and education.  Patient Consent Given: Yes Education handout provided: Yes Muscles treated: Soleus/gastroc Electrical stimulation performed: No Parameters: N/A Treatment response/outcome: Increased muscle length to palpation    PATIENT EDUCATION: Education details: TPDN Person educated: Patient Education  method: Customer service manager Education comprehension: verbalized understanding   HOME EXERCISE PROGRAM: Access Code [email or web address]    PT Short Term Goals - 07/28/21 1028       PT SHORT TERM GOAL #1   Title Pt will be independent with initial HEP    Time 3    Period Weeks    Status Achieved    Target Date 07/30/21      PT SHORT TERM GOAL #2   Title Pt will improve R ankle ROM to at least within 5 deg of neutral    Time 3    Period Weeks    Status Partially Met    Target Date 07/30/21      PT SHORT TERM GOAL #3   Title Pt will be able to stand on L  LE x5 min without use of knee brace    Baseline Able to hop on L LE with RW support    Time 3    Period Weeks    Status Achieved    Target Date 07/30/21      PT SHORT TERM GOAL #4   Title Pt will be able to amb with RW x 150' maintaining weight bearing precautions (R LE NWB) to demo improved household mobility    Baseline Able to amb in/out of clinic >150' with RW R LE NWB    Time 3    Period Weeks    Status Achieved    Target Date 07/30/21              PT Long Term Goals - 11/04/21 1125       PT LONG TERM GOAL #1   Title Pt will be independent with final HEP    Time 6    Period Weeks    Status Partially Met    Target Date 12/02/21      PT LONG TERM GOAL #2   Title Pt will be able to amb at least 1000' with LRAD for improved community mobility    Baseline SPC for limited community distances (~1/2 to 1 block') -- increased distance makes pt too sore    Time 6    Period Weeks    Status Met   Target Date 12/02/21      PT LONG TERM GOAL #3   Title Pt will be able to perform 5x STS without UE support to demo improved functional strength    Baseline able to perform 1 without UEs; 26 sec on 11/04/21    Time 6    Period Weeks    Status Achieved    Target Date 12/02/21      PT LONG TERM GOAL #4   Title Pt will have R = L ankle ROM to be able to ascend/descend steps    Baseline R dorsiflexion remains 5 deg  less than L; R ankle = 4 deg DF; L ankle = 15 deg on 11/30/21    Time 6    Period Weeks    Status Partially Met    Target Date 12/22/21      PT LONG TERM GOAL #5   Title Pt will have improved FOTO score to predicted level    Time 6    Period Weeks    Status Deferred    Target Date 12/22/21              Plan - 11/30/21 1143     Clinical Impression Statement Gait quality improved with cues and repetition while unweighted in aquatic environment.  Cues given to allow more neutral foot position and heel strike and toe off; reported less discomfort in lateral Rt ankle when not inverting foot at heel strike.  Encouraged pt to maintain 50% weight bearing by using AD when up walking instead of furniture walking.  She will continue to benefit from skilled PT to work towards goals and improve functional mobility.     PT Next Visit Plan Continue aquatics. Continue R ankle ROM, balance/stability, and strengthening, L knee ROM and strengthening. Work on Aeronautical engineer!!    PT Home Exercise Plan Access Code [email or web address]    Consulted and Agree with Plan of Care Patient;Family member/caregiver           PHYSICAL THERAPY DISCHARGE SUMMARY  Visits from Start of Care: 23  Current functional level  related to goals / functional outcomes: Improved mobility, ankle ROM and strength   Remaining deficits: See above   Education / Equipment: HEP land and aquatics   Patient agrees to discharge. Patient goals were partially met. Patient is being discharged due to a change in medical status. Isabelle Course, PT,DPT09/20/238:51 AM  Kerin Perna, PTA 12/02/21 2:02 PM

## 2021-12-07 ENCOUNTER — Ambulatory Visit: Payer: Medicaid Other | Admitting: Physical Therapy

## 2021-12-09 ENCOUNTER — Ambulatory Visit: Payer: Self-pay | Admitting: Student

## 2021-12-09 ENCOUNTER — Ambulatory Visit (HOSPITAL_BASED_OUTPATIENT_CLINIC_OR_DEPARTMENT_OTHER): Payer: Self-pay | Admitting: Physical Therapy

## 2021-12-13 ENCOUNTER — Other Ambulatory Visit: Payer: Self-pay

## 2021-12-13 ENCOUNTER — Encounter (HOSPITAL_COMMUNITY): Payer: Self-pay | Admitting: Student

## 2021-12-13 NOTE — Progress Notes (Signed)
Spoke with pt for pre-op call. Pt denies HTN, cardiac history or Diabetes. Pt is very anxious.   Shower instructions given to pt along with instructions to not smoke 24 hours prior to surgery.

## 2021-12-14 ENCOUNTER — Encounter: Payer: Self-pay | Admitting: Physical Therapy
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
## 2021-12-15 ENCOUNTER — Encounter (HOSPITAL_COMMUNITY)
Admission: RE | Disposition: A | Discharge status: Home / Self Care | Payer: Self-pay | Source: Home / Self Care | Attending: Student

## 2021-12-15 ENCOUNTER — Inpatient Hospital Stay (HOSPITAL_COMMUNITY): Payer: Medicaid Other | Admitting: Certified Registered"

## 2021-12-15 ENCOUNTER — Encounter (HOSPITAL_COMMUNITY): Payer: Self-pay | Admitting: Student

## 2021-12-15 ENCOUNTER — Inpatient Hospital Stay (HOSPITAL_COMMUNITY): Payer: Medicaid Other

## 2021-12-15 ENCOUNTER — Other Ambulatory Visit: Payer: Self-pay

## 2021-12-15 ENCOUNTER — Ambulatory Visit (HOSPITAL_COMMUNITY)
Admission: RE | Admit: 2021-12-15 | Discharge: 2021-12-15 | Disposition: A | Discharge status: Home / Self Care | Payer: Medicaid Other | Attending: Student | Admitting: Student

## 2021-12-15 DIAGNOSIS — F17210 Nicotine dependence, cigarettes, uncomplicated: Secondary | ICD-10-CM | POA: Insufficient documentation

## 2021-12-15 DIAGNOSIS — K219 Gastro-esophageal reflux disease without esophagitis: Secondary | ICD-10-CM | POA: Diagnosis not present

## 2021-12-15 DIAGNOSIS — T84116A Breakdown (mechanical) of internal fixation device of bone of right lower leg, initial encounter: Secondary | ICD-10-CM

## 2021-12-15 DIAGNOSIS — X58XXXD Exposure to other specified factors, subsequent encounter: Secondary | ICD-10-CM | POA: Insufficient documentation

## 2021-12-15 DIAGNOSIS — S82201K Unspecified fracture of shaft of right tibia, subsequent encounter for closed fracture with nonunion: Secondary | ICD-10-CM | POA: Insufficient documentation

## 2021-12-15 HISTORY — DX: Unspecified osteoarthritis, unspecified site: M19.90

## 2021-12-15 HISTORY — DX: Anxiety disorder, unspecified: F41.9

## 2021-12-15 HISTORY — DX: Gastro-esophageal reflux disease without esophagitis: K21.9

## 2021-12-15 HISTORY — PX: HARDWARE REMOVAL: SHX979

## 2021-12-15 HISTORY — PX: OPEN REDUCTION INTERNAL FIXATION (ORIF) TIBIA/FIBULA FRACTURE: SHX5992

## 2021-12-15 HISTORY — DX: Anemia, unspecified: D64.9

## 2021-12-15 HISTORY — DX: Sleep apnea, unspecified: G47.30

## 2021-12-15 LAB — BASIC METABOLIC PANEL WITH GFR
Anion gap: 6 (ref 5–15)
BUN: 10 mg/dL (ref 6–20)
CO2: 24 mmol/L (ref 22–32)
Calcium: 9.3 mg/dL (ref 8.9–10.3)
Chloride: 105 mmol/L (ref 98–111)
Creatinine, Ser: 0.8 mg/dL (ref 0.44–1.00)
GFR, Estimated: >60 mL/min
Glucose, Bld: 107 mg/dL — ABNORMAL HIGH (ref 70–99)
Potassium: 3.4 mmol/L — ABNORMAL LOW (ref 3.5–5.1)
Sodium: 135 mmol/L (ref 135–145)

## 2021-12-15 LAB — CBC
HCT: 35.8 % — ABNORMAL LOW (ref 36.0–46.0)
Hemoglobin: 11.2 g/dL — ABNORMAL LOW (ref 12.0–15.0)
MCH: 23.8 pg — ABNORMAL LOW (ref 26.0–34.0)
MCHC: 31.3 g/dL (ref 30.0–36.0)
MCV: 76 fL — ABNORMAL LOW (ref 80.0–100.0)
Platelets: 353 K/uL (ref 150–400)
RBC: 4.71 MIL/uL (ref 3.87–5.11)
RDW: 15.3 % (ref 11.5–15.5)
WBC: 8.9 K/uL (ref 4.0–10.5)
nRBC: 0 % (ref 0.0–0.2)

## 2021-12-15 LAB — POCT PREGNANCY, URINE: Preg Test, Ur: NEGATIVE

## 2021-12-15 SURGERY — OPEN REDUCTION INTERNAL FIXATION (ORIF) TIBIA/FIBULA FRACTURE
Anesthesia: Regional | Site: Leg Lower | Laterality: Right

## 2021-12-15 MED ORDER — FENTANYL CITRATE (PF) 100 MCG/2ML IJ SOLN
INTRAMUSCULAR | Status: AC
  Administered 2021-12-15: 50 ug
  Filled 2021-12-15: qty 2

## 2021-12-15 MED ORDER — MIDAZOLAM HCL 2 MG/2ML IJ SOLN
INTRAMUSCULAR | Status: AC
  Filled 2021-12-15: qty 2

## 2021-12-15 MED ORDER — ONDANSETRON HCL 4 MG/2ML IJ SOLN
INTRAMUSCULAR | Status: DC | PRN
  Administered 2021-12-15: 4 mg via INTRAVENOUS

## 2021-12-15 MED ORDER — VANCOMYCIN HCL 1000 MG IV SOLR
INTRAVENOUS | Status: AC
  Filled 2021-12-15: qty 20

## 2021-12-15 MED ORDER — DEXAMETHASONE SODIUM PHOSPHATE 10 MG/ML IJ SOLN
INTRAMUSCULAR | Status: AC
  Filled 2021-12-15: qty 1

## 2021-12-15 MED ORDER — FENTANYL CITRATE (PF) 100 MCG/2ML IJ SOLN
INTRAMUSCULAR | Status: AC
  Filled 2021-12-15: qty 2

## 2021-12-15 MED ORDER — ORAL CARE MOUTH RINSE: 15.0000 mL | Freq: Once | OROMUCOSAL | Status: AC

## 2021-12-15 MED ORDER — LIDOCAINE 2% (20 MG/ML) 5 ML SYRINGE
INTRAMUSCULAR | Status: DC | PRN
  Administered 2021-12-15: 80 mg via INTRAVENOUS

## 2021-12-15 MED ORDER — LABETALOL HCL 5 MG/ML IV SOLN
INTRAVENOUS | Status: DC | PRN
  Administered 2021-12-15 (×2): 5 mg via INTRAVENOUS

## 2021-12-15 MED ORDER — BUPIVACAINE-EPINEPHRINE (PF) 0.5% -1:200000 IJ SOLN
INTRAMUSCULAR | Status: DC | PRN
  Administered 2021-12-15: 30 mL via PERINEURAL

## 2021-12-15 MED ORDER — FENTANYL CITRATE (PF) 250 MCG/5ML IJ SOLN
INTRAMUSCULAR | Status: AC
  Filled 2021-12-15: qty 5

## 2021-12-15 MED ORDER — LACTATED RINGERS IV SOLN: INTRAVENOUS | Status: DC

## 2021-12-15 MED ORDER — BUPIVACAINE HCL (PF) 0.5 % IJ SOLN
INTRAMUSCULAR | Status: AC
  Filled 2021-12-15: qty 30

## 2021-12-15 MED ORDER — HYDROMORPHONE HCL 1 MG/ML IJ SOLN
INTRAMUSCULAR | Status: DC | PRN
  Administered 2021-12-15: .5 mg via INTRAVENOUS

## 2021-12-15 MED ORDER — VANCOMYCIN HCL 1000 MG IV SOLR
INTRAVENOUS | Status: DC | PRN
  Administered 2021-12-15: 1000 mg

## 2021-12-15 MED ORDER — FENTANYL CITRATE (PF) 250 MCG/5ML IJ SOLN
INTRAMUSCULAR | Status: DC | PRN
Start: 2021-12-15 — End: 2021-12-15
  Administered 2021-12-15: 100 ug via INTRAVENOUS
  Administered 2021-12-15 (×3): 50 ug via INTRAVENOUS

## 2021-12-15 MED ORDER — OXYCODONE-ACETAMINOPHEN 5-325 MG PO TABS: 1.0000 | ORAL_TABLET | ORAL | 0 refills | Status: DC | PRN

## 2021-12-15 MED ORDER — 0.9 % SODIUM CHLORIDE (POUR BTL) OPTIME
TOPICAL | Status: DC | PRN
  Administered 2021-12-15 (×2): 1000 mL

## 2021-12-15 MED ORDER — HYDROMORPHONE HCL 1 MG/ML IJ SOLN
INTRAMUSCULAR | Status: AC
  Filled 2021-12-15: qty 0.5

## 2021-12-15 MED ORDER — DEXAMETHASONE SODIUM PHOSPHATE 10 MG/ML IJ SOLN
INTRAMUSCULAR | Status: DC | PRN
  Administered 2021-12-15: 4 mg via INTRAVENOUS

## 2021-12-15 MED ORDER — ASPIRIN 81 MG PO TBEC: 81.0000 mg | DELAYED_RELEASE_TABLET | Freq: Every day | ORAL | 0 refills | Status: AC

## 2021-12-15 MED ORDER — LIDOCAINE 2% (20 MG/ML) 5 ML SYRINGE
INTRAMUSCULAR | Status: AC
  Filled 2021-12-15: qty 5

## 2021-12-15 MED ORDER — PROPOFOL 10 MG/ML IV BOLUS
INTRAVENOUS | Status: DC | PRN
  Administered 2021-12-15: 200 mg via INTRAVENOUS

## 2021-12-15 MED ORDER — ONDANSETRON HCL 4 MG PO TABS: 4.0000 mg | ORAL_TABLET | Freq: Three times a day (TID) | ORAL | 1 refills | Status: DC | PRN

## 2021-12-15 MED ORDER — MIDAZOLAM HCL 2 MG/2ML IJ SOLN
INTRAMUSCULAR | Status: DC | PRN
  Administered 2021-12-15: 2 mg via INTRAVENOUS

## 2021-12-15 MED ORDER — PROPOFOL 10 MG/ML IV BOLUS
INTRAVENOUS | Status: AC
  Filled 2021-12-15: qty 20

## 2021-12-15 MED ORDER — CHLORHEXIDINE GLUCONATE 0.12 % MT SOLN
15.0000 mL | Freq: Once | OROMUCOSAL | Status: AC
Start: 2021-12-15 — End: 2021-12-15
  Administered 2021-12-15: 15 mL via OROMUCOSAL
  Filled 2021-12-15: qty 15

## 2021-12-15 MED ORDER — ONDANSETRON HCL 4 MG/2ML IJ SOLN
INTRAMUSCULAR | Status: AC
  Filled 2021-12-15: qty 2

## 2021-12-15 MED ORDER — CEFAZOLIN SODIUM-DEXTROSE 2-4 GM/100ML-% IV SOLN
2.0000 g | INTRAVENOUS | Status: AC
  Administered 2021-12-15: 2 g via INTRAVENOUS

## 2021-12-15 MED ORDER — BACITRACIN ZINC 500 UNIT/GM EX OINT
TOPICAL_OINTMENT | CUTANEOUS | Status: AC
  Filled 2021-12-15: qty 28.35

## 2021-12-15 MED ORDER — CEFAZOLIN SODIUM 1 G IJ SOLR
INTRAMUSCULAR | Status: AC
  Filled 2021-12-15: qty 20

## 2021-12-15 MED ORDER — ROPIVACAINE HCL 7.5 MG/ML IJ SOLN
INTRAMUSCULAR | Status: DC | PRN
  Administered 2021-12-15: 15 mL via PERINEURAL

## 2021-12-15 MED ORDER — FENTANYL CITRATE (PF) 100 MCG/2ML IJ SOLN
25.0000 ug | INTRAMUSCULAR | Status: DC | PRN
  Administered 2021-12-15 (×2): 50 ug via INTRAVENOUS

## 2021-12-15 SURGICAL SUPPLY — 106 items
APL PRP STRL LF DISP 70% ISPRP (MISCELLANEOUS) ×1
BAG COUNTER SPONGE SURGICOUNT (BAG) ×2 IMPLANT
BAG SPNG CNTER NS LX DISP (BAG) ×1
BANDAGE ESMARK 6X9 LF (GAUZE/BANDAGES/DRESSINGS) ×1 IMPLANT
BIT DRILL 12 (BIT) ×2
BIT DRILL 190X12XCANN LRG QC (BIT) IMPLANT
BIT DRILL QC 2.0 SHORT EVOS SM (DRILL) IMPLANT
BIT DRILL QC 2.5MM SHRT EVO SM (DRILL) IMPLANT
BIT DRL 190X12XCANN LRG QC (BIT) ×1
BNDG CMPR 9X6 STRL LF SNTH (GAUZE/BANDAGES/DRESSINGS) ×1
BNDG CMPR STD VLCR NS LF 5.8X4 (GAUZE/BANDAGES/DRESSINGS) ×1
BNDG COHESIVE 4X5 TAN STRL (GAUZE/BANDAGES/DRESSINGS) ×1 IMPLANT
BNDG COHESIVE 6X5 TAN STRL LF (GAUZE/BANDAGES/DRESSINGS) ×1 IMPLANT
BNDG ELASTIC 4X5.8 VLCR NS LF (GAUZE/BANDAGES/DRESSINGS) ×1 IMPLANT
BNDG ELASTIC 4X5.8 VLCR STR LF (GAUZE/BANDAGES/DRESSINGS) ×1 IMPLANT
BNDG ELASTIC 6X5.8 VLCR STR LF (GAUZE/BANDAGES/DRESSINGS) ×2 IMPLANT
BNDG ESMARK 6X9 LF (GAUZE/BANDAGES/DRESSINGS) ×2
BNDG GAUZE ELAST 4 BULKY (GAUZE/BANDAGES/DRESSINGS) ×3 IMPLANT
BONE CANC CHIPS 20CC PCAN1/4 (Bone Implant) ×2 IMPLANT
BRUSH SCRUB EZ PLAIN DRY (MISCELLANEOUS) ×3 IMPLANT
CHIPS CANC BONE 20CC PCAN1/4 (Bone Implant) ×1 IMPLANT
CHLORAPREP W/TINT 26 (MISCELLANEOUS) ×2 IMPLANT
COVER SURGICAL LIGHT HANDLE (MISCELLANEOUS) ×4 IMPLANT
CUFF TOURN SGL QUICK 18X4 (TOURNIQUET CUFF) IMPLANT
CUFF TOURN SGL QUICK 24 (TOURNIQUET CUFF)
CUFF TOURN SGL QUICK 34 (TOURNIQUET CUFF) ×2
CUFF TRNQT CYL 24X4X16.5-23 (TOURNIQUET CUFF) IMPLANT
CUFF TRNQT CYL 34X4.125X (TOURNIQUET CUFF) IMPLANT
DRAPE C-ARM 42X72 X-RAY (DRAPES) ×2 IMPLANT
DRAPE C-ARMOR (DRAPES) ×2 IMPLANT
DRAPE ORTHO SPLIT 77X108 STRL (DRAPES) ×4
DRAPE SURG ORHT 6 SPLT 77X108 (DRAPES) ×2 IMPLANT
DRAPE U-SHAPE 47X51 STRL (DRAPES) ×2 IMPLANT
DRESSING MEPILEX FLEX 4X4 (GAUZE/BANDAGES/DRESSINGS) IMPLANT
DRILL QC 2.0 SHORT EVOS SM (DRILL) ×2
DRILL QC 2.5MM SHORT EVOS SM (DRILL) ×2
DRSG ADAPTIC 3X8 NADH LF (GAUZE/BANDAGES/DRESSINGS) ×2 IMPLANT
DRSG MEPILEX FLEX 4X4 (GAUZE/BANDAGES/DRESSINGS) ×2
DRSG MEPITEL 4X7.2 (GAUZE/BANDAGES/DRESSINGS) ×1 IMPLANT
ELECT REM PT RETURN 9FT ADLT (ELECTROSURGICAL) ×2
ELECTRODE REM PT RTRN 9FT ADLT (ELECTROSURGICAL) ×1 IMPLANT
GAUZE PAD ABD 8X10 STRL (GAUZE/BANDAGES/DRESSINGS) ×1 IMPLANT
GAUZE SPONGE 4X4 12PLY STRL (GAUZE/BANDAGES/DRESSINGS) ×3 IMPLANT
GLOVE BIO SURGEON STRL SZ 6.5 (GLOVE) ×7 IMPLANT
GLOVE BIO SURGEON STRL SZ7.5 (GLOVE) ×8 IMPLANT
GLOVE BIOGEL PI IND STRL 6.5 (GLOVE) ×1 IMPLANT
GLOVE BIOGEL PI IND STRL 7.5 (GLOVE) ×1 IMPLANT
GLOVE BIOGEL PI INDICATOR 6.5 (GLOVE) ×6
GLOVE BIOGEL PI INDICATOR 7.5 (GLOVE) ×4
GOWN STRL REUS W/ TWL LRG LVL3 (GOWN DISPOSABLE) ×2 IMPLANT
GOWN STRL REUS W/TWL LRG LVL3 (GOWN DISPOSABLE) ×4
GRAFT BNE CANC CHIPS 1-8 20CC (Bone Implant) IMPLANT
GUIDEWIRE 3.2X400 (WIRE) ×1 IMPLANT
K-WIRE 1.6 (WIRE) ×4
K-WIRE FX150X1.6XTROC PNT (WIRE) ×2
KIT BASIN OR (CUSTOM PROCEDURE TRAY) ×2 IMPLANT
KIT BONE HARVEST RIA 2 (ORTHOPEDIC DISPOSABLE SUPPLIES) ×1 IMPLANT
KIT TURNOVER KIT B (KITS) ×2 IMPLANT
KWIRE FX150X1.6XTROC PNT (WIRE) IMPLANT
MANIFOLD NEPTUNE II (INSTRUMENTS) ×2 IMPLANT
NDL HYPO 21X1.5 SAFETY (NEEDLE) IMPLANT
NDL HYPO 25GX1X1/2 BEV (NEEDLE) ×1 IMPLANT
NEEDLE 22X1 1/2 (OR ONLY) (NEEDLE) ×1 IMPLANT
NEEDLE HYPO 21X1.5 SAFETY (NEEDLE) ×2 IMPLANT
NEEDLE HYPO 25GX1X1/2 BEV (NEEDLE) ×2 IMPLANT
NS IRRIG 1000ML POUR BTL (IV SOLUTION) ×2 IMPLANT
PACK ORTHO EXTREMITY (CUSTOM PROCEDURE TRAY) ×2 IMPLANT
PACK TOTAL JOINT (CUSTOM PROCEDURE TRAY) ×2 IMPLANT
PAD ARMBOARD 7.5X6 YLW CONV (MISCELLANEOUS) ×4 IMPLANT
PAD CAST 4YDX4 CTTN HI CHSV (CAST SUPPLIES) IMPLANT
PADDING CAST COTTON 4X4 STRL (CAST SUPPLIES) ×4
PADDING CAST COTTON 6X4 STRL (CAST SUPPLIES) ×5 IMPLANT
PLATE TIB EVOS 15H R 2.7X195 (Plate) ×1 IMPLANT
REAMER HEAD RIA 2 14.0 (INSTRUMENTS) ×1 IMPLANT
REAMER ROD DEEP FLUTE 2.5X950 (INSTRUMENTS) ×1 IMPLANT
SCREW CORT 2.7X40 STAR T8 EVOS (Screw) ×1 IMPLANT
SCREW CORT 3.5X30 ST EVOS (Screw) ×1 IMPLANT
SCREW CORT 3.5X44MM ST EVOS (Screw) ×1 IMPLANT
SCREW CORT EVOS ST 3.5X28 (Screw) ×2 IMPLANT
SCREW CORTEX 3.5X26 (Screw) ×2 IMPLANT
SCREW LOCK EVOS 2.7X44 (Screw) ×1 IMPLANT
SCREW LOCK EVOS 2.7X46 (Screw) ×2 IMPLANT
SCREW LOCK ST EVOS 3.5X40 (Screw) ×1 IMPLANT
SPONGE T-LAP 18X18 ~~LOC~~+RFID (SPONGE) ×2 IMPLANT
STAPLER VISISTAT 35W (STAPLE) ×2 IMPLANT
STOCKINETTE IMPERVIOUS LG (DRAPES) ×2 IMPLANT
STRIP CLOSURE SKIN 1/2X4 (GAUZE/BANDAGES/DRESSINGS) ×1 IMPLANT
SUCTION FRAZIER HANDLE 10FR (MISCELLANEOUS) ×2
SUCTION TUBE FRAZIER 10FR DISP (MISCELLANEOUS) ×1 IMPLANT
SUT ETHILON 3 0 FSL (SUTURE) ×1 IMPLANT
SUT ETHILON 3 0 PS 1 (SUTURE) ×5 IMPLANT
SUT MNCRL AB 3-0 PS2 18 (SUTURE) ×1 IMPLANT
SUT MON AB 2-0 CT1 36 (SUTURE) ×1 IMPLANT
SUT PDS AB 2-0 CT1 27 (SUTURE) IMPLANT
SUT PROLENE 0 CT (SUTURE) IMPLANT
SUT VIC AB 0 CT1 27 (SUTURE) ×2
SUT VIC AB 0 CT1 27XBRD ANBCTR (SUTURE) ×1 IMPLANT
SUT VIC AB 2-0 CT1 27 (SUTURE) ×4
SUT VIC AB 2-0 CT1 TAPERPNT 27 (SUTURE) ×2 IMPLANT
SYR CONTROL 10ML LL (SYRINGE) ×2 IMPLANT
TOWEL GREEN STERILE (TOWEL DISPOSABLE) ×4 IMPLANT
TOWEL GREEN STERILE FF (TOWEL DISPOSABLE) ×4 IMPLANT
TUBE CONNECTING 12X1/4 (SUCTIONS) ×2 IMPLANT
UNDERPAD 30X36 HEAVY ABSORB (UNDERPADS AND DIAPERS) ×2 IMPLANT
WATER STERILE IRR 1000ML POUR (IV SOLUTION) ×3 IMPLANT
YANKAUER SUCT BULB TIP NO VENT (SUCTIONS) ×2 IMPLANT

## 2021-12-15 NOTE — Anesthesia Preprocedure Evaluation (Addendum)
Anesthesia Evaluation  Patient identified by MRN, date of birth, ID band Patient awake    Reviewed: Allergy & Precautions, NPO status , Patient's Chart, lab work & pertinent test results  History of Anesthesia Complications Negative for: history of anesthetic complications  Airway Mallampati: I  TM Distance: >3 FB Neck ROM: Full    Dental  (+) Teeth Intact, Dental Advisory Given   Pulmonary neg shortness of breath, asthma , sleep apnea , neg recent URI, Current Smoker and Patient abstained from smoking.,    breath sounds clear to auscultation       Cardiovascular hypertension, (-) angina(-) Past MI and (-) CHF  Rhythm:Regular     Neuro/Psych PSYCHIATRIC DISORDERS Anxiety Bipolar Disorder Schizophrenia negative neurological ROS     GI/Hepatic GERD  Controlled,  Endo/Other  negative endocrine ROS  Renal/GU negative Renal ROS     Musculoskeletal  (+) Arthritis , Right tibial nonunion   Abdominal   Peds  Hematology  (+) Blood dyscrasia, anemia , Lab Results      Component                Value               Date                      WBC                      8.9                 12/15/2021                HGB                      11.2 (L)            12/15/2021                HCT                      35.8 (L)            12/15/2021                MCV                      76.0 (L)            12/15/2021                PLT                      353                 12/15/2021               Anesthesia Other Findings   Reproductive/Obstetrics negative OB ROS Lab Results      Component                Value               Date                      PREGTESTUR               NEGATIVE            12/15/2021  HCG                      <5.0                05/07/2021                                       Anesthesia Physical Anesthesia Plan  ASA: 2  Anesthesia Plan: General   Post-op Pain  Management: Toradol IV (intra-op)* and Ofirmev IV (intra-op)*   Induction:   PONV Risk Score and Plan: 2 and Ondansetron and Dexamethasone  Airway Management Planned: LMA  Additional Equipment: None  Intra-op Plan:   Post-operative Plan: Extubation in OR  Informed Consent: I have reviewed the patients History and Physical, chart, labs and discussed the procedure including the risks, benefits and alternatives for the proposed anesthesia with the patient or authorized representative who has indicated his/her understanding and acceptance.     Dental advisory given  Plan Discussed with:   Anesthesia Plan Comments: (Discussed post op block if pain not controlled in PACU. Patient agrees )        Anesthesia Quick Evaluation
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
## 2021-12-15 NOTE — Anesthesia Postprocedure Evaluation (Signed)
Anesthesia Post Note  Patient: Gina Martin  Procedure(s) Performed: 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 (Right: Leg Lower) HARDWARE REMOVAL RIGHT TIBIA (Right: Leg Lower)     Patient location during evaluation: PACU Anesthesia Type: General and Regional Level of consciousness: awake and alert Pain management: pain level controlled Vital Signs Assessment: post-procedure vital signs reviewed and stable Respiratory status: spontaneous breathing, nonlabored ventilation and respiratory function stable Cardiovascular status: blood pressure returned to baseline and stable Postop Assessment: no apparent nausea or vomiting Anesthetic complications: no   No notable events documented.  Last Vitals:  Vitals:   12/15/21 1315 12/15/21 1327  BP: 131/82 124/79  Pulse: 88 83  Resp: 17 19  Temp:  36.7 C  SpO2: 100% 100%    Last Pain:  Vitals:   12/15/21 1327  TempSrc:   PainSc: 4                  Reilynn Lauro

## 2021-12-15 NOTE — Anesthesia Procedure Notes (Signed)
Procedure Name: LMA Insertion Date/Time: 12/15/2021 9:07 AM  Performed by: Cheree Ditto, CRNAPre-anesthesia Checklist: Patient identified, Emergency Drugs available, Suction available and Patient being monitored Patient Re-evaluated:Patient Re-evaluated prior to induction Oxygen Delivery Method: Circle system utilized Preoxygenation: Pre-oxygenation with 100% oxygen Induction Type: IV induction Ventilation: Mask ventilation without difficulty LMA: LMA with gastric port inserted LMA Size: 5.0 Number of attempts: 1 Placement Confirmation: breath sounds checked- equal and bilateral and positive ETCO2 Tube secured with: Tape

## 2021-12-15 NOTE — Anesthesia Procedure Notes (Signed)
Anesthesia Regional Block: Adductor canal block   Pre-Anesthetic Checklist: , timeout performed,  Correct Patient, Correct Site, Correct Laterality,  Correct Procedure, Correct Position, site marked,  Risks and benefits discussed,  Surgical consent,  Pre-op evaluation,  At surgeon's request and post-op pain management  Laterality: Right and Lower  Prep: chloraprep       Needles:  Injection technique: Single-shot      Needle Length: 9cm  Needle Gauge: 22     Additional Needles: Arrow StimuQuik ECHO Echogenic Stimulating PNB Needle  Procedures:,,,, ultrasound used (permanent image in chart),,    Narrative:  Start time: 12/15/2021 11:48 AM End time: 12/15/2021 11:51 AM Injection made incrementally with aspirations every 5 mL.  Performed by: Personally  Anesthesiologist: Val Eagle, MD

## 2021-12-15 NOTE — Anesthesia Procedure Notes (Signed)
Anesthesia Regional Block: Popliteal block   Pre-Anesthetic Checklist: , timeout performed,  Correct Patient, Correct Site, Correct Laterality,  Correct Procedure, Correct Position, site marked,  Risks and benefits discussed,  Surgical consent,  Pre-op evaluation,  At surgeon's request and post-op pain management  Laterality: Right and Lower  Prep: chloraprep       Needles:  Injection technique: Single-shot      Needle Length: 9cm  Needle Gauge: 22     Additional Needles: Arrow StimuQuik ECHO Echogenic Stimulating PNB Needle  Procedures:,,,, ultrasound used (permanent image in chart),,    Narrative:  Start time: 12/15/2021 11:54 AM End time: 12/15/2021 11:59 AM Injection made incrementally with aspirations every 5 mL.  Performed by: Personally  Anesthesiologist: Val Eagle, MD

## 2021-12-15 NOTE — Transfer of Care (Signed)
Immediate Anesthesia Transfer of Care Note  Patient: Gina Martin  Procedure(s) Performed: 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 (Right: Leg Lower) HARDWARE REMOVAL RIGHT TIBIA (Right: Leg Lower)  Patient Location: PACU  Anesthesia Type:General  Level of Consciousness: awake, alert  and oriented  Airway & Oxygen Therapy: Patient Spontanous Breathing and Patient connected to nasal cannula oxygen  Post-op Assessment: Report given to RN and Post -op Vital signs reviewed and stable  Post vital signs: Reviewed and stable  Last Vitals:  Vitals Value Taken Time  BP 134/82 12/15/21 1158  Temp    Pulse 92 12/15/21 1200  Resp 14 12/15/21 1200  SpO2 100 % 12/15/21 1200  Vitals shown include unvalidated device data.  Last Pain:  Vitals:   12/15/21 0727  TempSrc: Oral         Complications: No notable events documented.

## 2021-12-15 NOTE — Discharge Instructions (Addendum)
Truitt Merle, MD Ulyses Southward PA-C [facility] [address] [phone] (tel)   [phone] (fax)                                  POST-OPERATIVE INSTRUCTIONS     WEIGHT BEARING STATUS: Non-weightbearing right lower extremity  RANGE OF MOTION/ACTIVITY: Ok for knee motion as tolerated  WOUND CARE Please keep splint clean dry and intact until follow-up. If your splint gets wet for any reason please contact the office immediately.  Do not stick anything down your splint such as pencils, momey, hangers to try and scratch yourself.  If you feel itchy take Benadryl as prescribed on the bottle for itching You may shower on Post-Op Day #2.  You must keep splint dry during this process and may find that a plastic bag taped around the extremity or alternatively a towel based bath may be a better option.   If you get your splint wet or if it is damaged please contact our clinic.  EXERCISES Due to your splint being in place you will not be able to bear weight through your extremity.   DO NOT PUT ANY WEIGHT ON YOUR OPERATIVE LEG Please use crutches or a walker to avoid weight bearing.   DVT/PE prophylaxis: None  DIET: As you were eating previously.  Can use over the counter stool softeners and bowel preparations, such as Miralax, to help with bowel movements.  Narcotics can be constipating.  Be sure to drink plenty of fluids  REGIONAL ANESTHESIA (NERVE BLOCKS) The anesthesia team may have performed a nerve block for you if safe in the setting of your care.  This is a great tool used to minimize pain.  Typically the block may start wearing off overnight but the long acting medicine may last for 3-4 days.  The nerve block wearing off can be a challenging period but please utilize your as needed pain medications to try and manage this period.    POST-OP MEDICATIONS- Multimodal approach to pain control  In general your pain will be controlled with a combination of  substances.  Prescriptions unless otherwise discussed are electronically sent to your pharmacy.  This is a carefully made plan we use to minimize narcotic use.     - Acetaminophen - Non-narcotic pain medicine taken on a scheduled basis   - Oxycodone - This is a strong narcotic, to be used only on an "as needed" basis for pain.  -  Aspirin 81mg  - This medicine is used to minimize the risk of blood clots after surgery.             -          Zofran - take as needed for nausea   FOLLOW-UP If you develop a Fever (>101.5), Redness or Drainage from the surgical incision site, please call our office to arrange for an evaluation. Please call the office to schedule a follow-up appointment for your incision check if you do not already have one, 7-10 days post-operatively.   VISIT OUR WEBSITE FOR ADDITIONAL INFORMATION: [email or web address]   HELPFUL INFORMATION  If you had a block, it will wear off between 8-24 hrs postop typically.  This is period when your pain may go from nearly zero to the pain you would have had postop without the block.  This is an abrupt transition but nothing dangerous is happening.  You may take an extra  dose of narcotic when this happens.  You should wean off your narcotic medicines as soon as you are able.  Most patients will be off or using minimal narcotics before their first postop appointment.   We suggest you use the pain medication the first night prior to going to bed, in order to ease any pain when the anesthesia wears off. You should avoid taking pain medications on an empty stomach as it will make you nauseous.  Do not drink alcoholic beverages or take illicit drugs when taking pain medications.  In most states it is against the law to drive while you are in a splint or sling.  And certainly against the law to drive while taking narcotics.  You may return to work/school in the next couple of days when you feel up to it.   Pain medication may make you constipated.   Below are a few solutions to try in this order: Decrease the amount of pain medication if you aren't having pain. Drink lots of decaffeinated fluids. Drink prune juice and/or each dried prunes  If the first 3 don't work start with additional solutions Take Colace - an over-the-counter stool softener Take Senokot - an over-the-counter laxative Take Miralax - a stronger over-the-counter laxative

## 2021-12-16 ENCOUNTER — Encounter (HOSPITAL_COMMUNITY): Payer: Self-pay | Admitting: Student

## 2021-12-16 ENCOUNTER — Ambulatory Visit (HOSPITAL_BASED_OUTPATIENT_CLINIC_OR_DEPARTMENT_OTHER): Payer: Self-pay | Admitting: Physical Therapy

## 2021-12-20 LAB — AEROBIC/ANAEROBIC CULTURE W GRAM STAIN (SURGICAL/DEEP WOUND)
Culture: NO GROWTH
Culture: NO GROWTH
Culture: NO GROWTH
Gram Stain: NONE SEEN
Gram Stain: NONE SEEN
Gram Stain: NONE SEEN

## 2021-12-21 ENCOUNTER — Encounter: Payer: Self-pay | Admitting: Physical Therapy

## 2021-12-23 ENCOUNTER — Ambulatory Visit (HOSPITAL_BASED_OUTPATIENT_CLINIC_OR_DEPARTMENT_OTHER): Payer: Self-pay | Admitting: Physical Therapy

## 2022-01-26 ENCOUNTER — Encounter (HOSPITAL_BASED_OUTPATIENT_CLINIC_OR_DEPARTMENT_OTHER): Payer: Self-pay | Admitting: Emergency Medicine

## 2022-01-26 ENCOUNTER — Emergency Department (HOSPITAL_BASED_OUTPATIENT_CLINIC_OR_DEPARTMENT_OTHER)
Admission: EM | Admit: 2022-01-26 | Discharge: 2022-01-26 | Disposition: A | Discharge status: Home / Self Care | Payer: Medicaid Other | Attending: Emergency Medicine | Admitting: Emergency Medicine

## 2022-01-26 DIAGNOSIS — F10929 Alcohol use, unspecified with intoxication, unspecified: Secondary | ICD-10-CM | POA: Insufficient documentation

## 2022-01-26 DIAGNOSIS — Z7982 Long term (current) use of aspirin: Secondary | ICD-10-CM | POA: Insufficient documentation

## 2022-01-26 DIAGNOSIS — F10920 Alcohol use, unspecified with intoxication, uncomplicated: Secondary | ICD-10-CM

## 2022-01-26 MED ORDER — ONDANSETRON 4 MG PO TBDP
4.0000 mg | ORAL_TABLET | Freq: Three times a day (TID) | ORAL | 0 refills | Status: AC | PRN
Start: 2022-01-26 — End: ?

## 2022-01-26 MED ORDER — ONDANSETRON 4 MG PO TBDP
8.0000 mg | ORAL_TABLET | Freq: Once | ORAL | Status: AC
  Administered 2022-01-26: 8 mg via ORAL
  Filled 2022-01-26: qty 2

## 2022-01-26 NOTE — ED Provider Notes (Signed)
[facility] EMERGENCY DEPARTMENT Provider Note   CSN: [id number] Arrival date & time: 01/26/22  0036     History  Chief Complaint  Patient presents with   Alcohol Intoxication    Gina Martin is a 31 y.o. female.  The history is provided by the patient and the EMS personnel.  Alcohol Intoxication Gina Martin is a 31 y.o. female who presents to the Emergency Department complaining of alcohol intoxication.  She presents to the emergency department by EMS for evaluation of alcohol intoxication.  She states that she had not had a drink since January 6 and then today she started drinking.  She then developed nausea and vomiting and dry heaves and did not know what to do so she called 911.  No SI, HI.  No known medical problems.  No chance of pregnancy.  No current pain.     Home Medications Prior to Admission medications   Medication Sig Start Date End Date Taking? Authorizing Provider  acetaminophen (TYLENOL) 650 MG CR tablet Take 650 mg by mouth every 8 (eight) hours as needed for pain.    [provider]  albuterol (VENTOLIN HFA) 108 (90 Base) MCG/ACT inhaler Inhale 2 puffs into the lungs every 6 (six) hours as needed for wheezing or shortness of breath. Asthma    [provider]  aspirin EC 81 MG tablet Take 1 tablet (81 mg total) by mouth daily. Swallow whole. 12/15/21   Corinne Ports, PA-C  cholecalciferol (VITAMIN D3) 25 MCG (1000 UNIT) tablet Take 1,000 Units by mouth daily.    [provider]  Multiple Vitamins-Minerals (MULTIVITAMIN WITH MINERALS) tablet Take 1 tablet by mouth daily.    [provider]  ondansetron (ZOFRAN) 4 MG tablet Take 1 tablet (4 mg total) by mouth every 8 (eight) hours as needed for nausea or vomiting. 12/15/21   Corinne Ports, PA-C  oxyCODONE-acetaminophen (PERCOCET) 5-325 MG tablet Take 1 tablet by mouth every 4 (four) hours as needed for severe pain. 12/15/21   Corinne Ports, PA-C  omeprazole  (PRILOSEC) 20 MG capsule Take 1 capsule (20 mg total) by mouth daily. 05/21/19 07/29/20  Recardo Evangelist, PA-C      Allergies    Shellfish allergy and Amoxicillin    Review of Systems   Review of Systems  All other systems reviewed and are negative.   Physical Exam Updated Vital Signs There were no vitals taken for this visit. Physical Exam Vitals and nursing note reviewed.  Constitutional:      Appearance: She is well-developed.     Comments: Appears intoxicated  HENT:     Head: Normocephalic and atraumatic.  Cardiovascular:     Rate and Rhythm: Normal rate and regular rhythm.     Heart sounds: No murmur heard. Pulmonary:     Effort: Pulmonary effort is normal. No respiratory distress.     Breath sounds: Normal breath sounds.  Abdominal:     Palpations: Abdomen is soft.     Tenderness: There is no abdominal tenderness. There is no guarding or rebound.  Musculoskeletal:        General: No tenderness.  Skin:    General: Skin is warm and dry.  Neurological:     Mental Status: She is alert and oriented to person, place, and time.    ED Results / Procedures / Treatments   Labs (all labs ordered are listed, but only abnormal results are displayed) Labs Reviewed - No data to display  EKG  None  Radiology No results found.  Procedures Procedures    Medications Ordered in ED Medications - No data to display  ED Course/ Medical Decision Making/ A&P                           Medical Decision Making Risk Prescription drug management.   Patient here for evaluation of alcohol intoxication.  She is intoxicated on evaluation.  She did have vomiting and dry heaving prior to ED arrival.  Symptoms have resolved during her ED stay.  She was treated with ondansetron and able to tolerate oral fluids.  She is not suicidal, homicidal.  No additional ingestions.  A family member arrived in the emergency department to take the patient home.  Discussed intoxication precautions.   Feel she is stable for discharge home.        Final Clinical Impression(s) / ED Diagnoses Final diagnoses:  Alcoholic intoxication without complication [facility])    Rx / DC Orders ED Discharge Orders     None         Tilden Fossa, MD 01/26/22 0225

## 2022-01-26 NOTE — ED Triage Notes (Signed)
Per [facility], etoh intoxication x 3 hours. Emesis episode at home.

## 2022-02-14 ENCOUNTER — Ambulatory Visit: Payer: Medicaid Other | Attending: Student | Admitting: Physical Therapy

## 2022-02-14 ENCOUNTER — Other Ambulatory Visit: Payer: Self-pay

## 2022-02-14 ENCOUNTER — Encounter: Payer: Self-pay | Admitting: Physical Therapy

## 2022-02-14 DIAGNOSIS — M25562 Pain in left knee: Secondary | ICD-10-CM | POA: Insufficient documentation

## 2022-02-14 DIAGNOSIS — M6281 Muscle weakness (generalized): Secondary | ICD-10-CM | POA: Insufficient documentation

## 2022-02-14 DIAGNOSIS — R2689 Other abnormalities of gait and mobility: Secondary | ICD-10-CM | POA: Insufficient documentation

## 2022-02-14 DIAGNOSIS — M25671 Stiffness of right ankle, not elsewhere classified: Secondary | ICD-10-CM | POA: Insufficient documentation

## 2022-02-14 DIAGNOSIS — R600 Localized edema: Secondary | ICD-10-CM | POA: Insufficient documentation

## 2022-02-14 DIAGNOSIS — R262 Difficulty in walking, not elsewhere classified: Secondary | ICD-10-CM | POA: Insufficient documentation
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
## 2022-02-23 ENCOUNTER — Ambulatory Visit: Payer: Medicaid Other | Admitting: Physical Therapy

## 2022-02-28 ENCOUNTER — Emergency Department (HOSPITAL_BASED_OUTPATIENT_CLINIC_OR_DEPARTMENT_OTHER)
Admission: EM | Admit: 2022-02-28 | Discharge: 2022-02-28 | Disposition: A | Discharge status: Home / Self Care | Payer: Medicaid Other | Attending: Emergency Medicine | Admitting: Emergency Medicine

## 2022-02-28 ENCOUNTER — Encounter (HOSPITAL_BASED_OUTPATIENT_CLINIC_OR_DEPARTMENT_OTHER): Payer: Self-pay | Admitting: *Deleted

## 2022-02-28 ENCOUNTER — Other Ambulatory Visit: Payer: Self-pay

## 2022-02-28 ENCOUNTER — Emergency Department (HOSPITAL_BASED_OUTPATIENT_CLINIC_OR_DEPARTMENT_OTHER): Payer: Medicaid Other

## 2022-02-28 DIAGNOSIS — N939 Abnormal uterine and vaginal bleeding, unspecified: Secondary | ICD-10-CM

## 2022-02-28 DIAGNOSIS — N888 Other specified noninflammatory disorders of cervix uteri: Secondary | ICD-10-CM | POA: Diagnosis not present

## 2022-02-28 DIAGNOSIS — N949 Unspecified condition associated with female genital organs and menstrual cycle: Secondary | ICD-10-CM

## 2022-02-28 DIAGNOSIS — R1030 Lower abdominal pain, unspecified: Secondary | ICD-10-CM | POA: Diagnosis present

## 2022-02-28 DIAGNOSIS — Z7982 Long term (current) use of aspirin: Secondary | ICD-10-CM | POA: Diagnosis not present

## 2022-02-28 LAB — URINALYSIS, ROUTINE W REFLEX MICROSCOPIC
Bilirubin Urine: NEGATIVE
Glucose, UA: NEGATIVE mg/dL
Ketones, ur: NEGATIVE mg/dL
Leukocytes,Ua: NEGATIVE
Nitrite: NEGATIVE
Protein, ur: NEGATIVE mg/dL
Specific Gravity, Urine: 1.01 (ref 1.005–1.030)
pH: 6 (ref 5.0–8.0)

## 2022-02-28 LAB — CBC
HCT: 33.1 % — ABNORMAL LOW (ref 36.0–46.0)
Hemoglobin: 9.7 g/dL — ABNORMAL LOW (ref 12.0–15.0)
MCH: 21.1 pg — ABNORMAL LOW (ref 26.0–34.0)
MCHC: 29.3 g/dL — ABNORMAL LOW (ref 30.0–36.0)
MCV: 72.1 fL — ABNORMAL LOW (ref 80.0–100.0)
Platelets: 521 10*3/uL — ABNORMAL HIGH (ref 150–400)
RBC: 4.59 MIL/uL (ref 3.87–5.11)
RDW: 15.9 % — ABNORMAL HIGH (ref 11.5–15.5)
WBC: 10.2 10*3/uL (ref 4.0–10.5)
nRBC: 0 % (ref 0.0–0.2)

## 2022-02-28 LAB — PREGNANCY, URINE: Preg Test, Ur: NEGATIVE

## 2022-02-28 LAB — WET PREP, GENITAL
Clue Cells Wet Prep HPF POC: NONE SEEN
Sperm: NONE SEEN
Trich, Wet Prep: NONE SEEN
WBC, Wet Prep HPF POC: <10 (ref <10)
Yeast Wet Prep HPF POC: NONE SEEN

## 2022-02-28 LAB — URINALYSIS, MICROSCOPIC (REFLEX)

## 2022-02-28 MED ORDER — METRONIDAZOLE 500 MG PO TABS: 500.0000 mg | ORAL_TABLET | Freq: Two times a day (BID) | ORAL | 0 refills | Status: AC

## 2022-02-28 MED ORDER — DOXYCYCLINE HYCLATE 100 MG PO CAPS: 100.0000 mg | ORAL_CAPSULE | Freq: Two times a day (BID) | ORAL | 0 refills | Status: AC

## 2022-02-28 MED ORDER — CEFTRIAXONE SODIUM 500 MG IJ SOLR
500.0000 mg | Freq: Once | INTRAMUSCULAR | Status: AC
  Administered 2022-02-28: 500 mg via INTRAMUSCULAR
  Filled 2022-02-28: qty 500

## 2022-02-28 MED ORDER — LIDOCAINE HCL (PF) 1 % IJ SOLN
INTRAMUSCULAR | Status: AC
  Administered 2022-02-28: 1 mL via INTRAMUSCULAR
  Filled 2022-02-28: qty 5

## 2022-02-28 NOTE — ED Triage Notes (Signed)
Presents with complaints of vaginal bleeding. Feeling tired and weak. Stated that this has been occurring for the past 12 days.

## 2022-02-28 NOTE — ED Notes (Signed)
Patient denies any adverse reaction to the antibiotic.  She verbalized understanding of discharge instructions and reasons to return to the Ed.  She is to contact the GYN today for further follow up.

## 2022-02-28 NOTE — ED Notes (Signed)
Pelvic Cart to bedside 

## 2022-02-28 NOTE — ED Notes (Signed)
Lab phoned to request Urine Culture be added to lab exams

## 2022-02-28 NOTE — ED Provider Notes (Cosign Needed Addendum)
[facility] EMERGENCY DEPARTMENT Provider Note   CSN: [id number] Arrival date & time: 02/28/22  3382     History  Chief Complaint  Patient presents with   Vaginal Bleeding    Gina Martin is a 31 y.o. female presents to the ER with concerns of 12 days of vaginal bleeding and fatigue.  Patient states her menstrual cycle came 1 week early and has been abnormal.  She normally has regular cycles every 28 to 35 days.  She reports bleeding initially was very heavy with large clots.  Bleeding continues to be heavy most days, she has gone through an entire package of overnight pads.  She also endorses lower abdominal pain, difficulty urinating, and dyspareunia.  Denies vaginal discharge, fever, chills, dysuria, hematuria, flank pain, palpitations, shortness of breath, syncope.  She does not express concerns for STI/STD.  She is regularly followed by OB/GYN.  She has history of tubal ligation in 2019 during C-section delivery of triplets.       Home Medications Prior to Admission medications   Medication Sig Start Date End Date Taking? Authorizing Provider  doxycycline (VIBRAMYCIN) 100 MG capsule Take 1 capsule (100 mg total) by mouth 2 (two) times daily. 02/28/22  Yes Lovene Maret R, PA  metroNIDAZOLE (FLAGYL) 500 MG tablet Take 1 tablet (500 mg total) by mouth 2 (two) times daily. 02/28/22  Yes Deziray Nabi R, PA  acetaminophen (TYLENOL) 650 MG CR tablet Take 650 mg by mouth every 8 (eight) hours as needed for pain.    [provider]  albuterol (VENTOLIN HFA) 108 (90 Base) MCG/ACT inhaler Inhale 2 puffs into the lungs every 6 (six) hours as needed for wheezing or shortness of breath. Asthma    [provider]  aspirin EC 81 MG tablet Take 1 tablet (81 mg total) by mouth daily. Swallow whole. 12/15/21   West Bali, PA-C  cholecalciferol (VITAMIN D3) 25 MCG (1000 UNIT) tablet Take 1,000 Units by mouth daily.    [provider]  Multiple  Vitamins-Minerals (MULTIVITAMIN WITH MINERALS) tablet Take 1 tablet by mouth daily.    [provider]  ondansetron (ZOFRAN) 4 MG tablet Take 1 tablet (4 mg total) by mouth every 8 (eight) hours as needed for nausea or vomiting. 12/15/21   West Bali, PA-C  ondansetron (ZOFRAN-ODT) 4 MG disintegrating tablet Take 1 tablet (4 mg total) by mouth every 8 (eight) hours as needed for nausea or vomiting. 01/26/22   Tilden Fossa, MD  oxyCODONE-acetaminophen (PERCOCET) 5-325 MG tablet Take 1 tablet by mouth every 4 (four) hours as needed for severe pain. 12/15/21   [place] Bali, PA-C  omeprazole (PRILOSEC) 20 MG capsule Take 1 capsule (20 mg total) by mouth daily. 05/21/19 07/29/20  Bethel Born, PA-C      Allergies    Shellfish allergy and Amoxicillin    Review of Systems   Review of Systems  Constitutional:  Positive for fatigue. Negative for fever.  Respiratory:  Negative for shortness of breath.   Cardiovascular:  Negative for palpitations.  Gastrointestinal:  Positive for abdominal pain and nausea. Negative for vomiting.  Genitourinary:  Positive for difficulty urinating, dyspareunia, vaginal bleeding and vaginal pain. Negative for dysuria, flank pain and vaginal discharge.  Neurological:  Negative for dizziness, syncope and light-headedness.    Physical Exam Updated Vital Signs BP 120/73 (BP Location: Right Arm)   Pulse 80   Temp 98.5 F (36.9 C) (Oral)   Resp 20   SpO2 100%  Physical Exam Vitals and nursing note reviewed. Exam conducted with a chaperone present.  Constitutional:      General: She is not in acute distress.    Appearance: She is obese. She is not ill-appearing.  Cardiovascular:     Rate and Rhythm: Regular rhythm. Tachycardia present.     Pulses: Normal pulses.     Heart sounds: Normal heart sounds.  Pulmonary:     Effort: Pulmonary effort is normal. No respiratory distress.     Breath sounds: Normal breath sounds and air entry.  Abdominal:      General: Abdomen is flat. Bowel sounds are normal. There is no distension.     Palpations: Abdomen is soft.     Tenderness: There is abdominal tenderness in the right lower quadrant, suprapubic area and left lower quadrant.  Genitourinary:    General: Normal vulva.     Exam position: Lithotomy position.     Pubic Area: No rash.      Labia:        Right: No rash, lesion or injury.        Left: No rash, lesion or injury.      Vagina: Tenderness present. No vaginal discharge, erythema or lesions.     Cervix: Cervical motion tenderness and cervical bleeding present. No discharge, friability or erythema.     Uterus: Normal.      Adnexa:        Right: Tenderness present.        Left: Tenderness present.      Comments: Moderate amount of red blood present in vaginal vault coming from cervical os.  Patient complains of moderate-severe pain throughout speculum and bimanual exam. Skin:    General: Skin is warm and dry.     Capillary Refill: Capillary refill takes less than 2 seconds.  Neurological:     Mental Status: She is alert. Mental status is at baseline.  Psychiatric:        Mood and Affect: Mood normal.        Behavior: Behavior normal.     ED Results / Procedures / Treatments   Labs (all labs ordered are listed, but only abnormal results are displayed) Labs Reviewed  URINALYSIS, ROUTINE W REFLEX MICROSCOPIC - Abnormal; Notable for the following components:      Result Value   Hgb urine dipstick MODERATE (*)    All other components within normal limits  CBC - Abnormal; Notable for the following components:   Hemoglobin 9.7 (*)    HCT 33.1 (*)    MCV 72.1 (*)    MCH 21.1 (*)    MCHC 29.3 (*)    RDW 15.9 (*)    Platelets 521 (*)    All other components within normal limits  URINALYSIS, MICROSCOPIC (REFLEX) - Abnormal; Notable for the following components:   Bacteria, UA RARE (*)    All other components within normal limits  WET PREP, GENITAL  URINE CULTURE   PREGNANCY, URINE  RPR  GC/CHLAMYDIA PROBE AMP ([place]) NOT AT [facility]    EKG None  Radiology US PELVIC COMPLETE W TRANSVAGINAL AND TORSION R/O  Result Date: 02/28/2022 CLINICAL DATA:  Pelvic pain EXAM: TRANSABDOMINAL AND TRANSVAGINAL ULTRASOUND OF PELVIS DOPPLER ULTRASOUND OF OVARIES TECHNIQUE: Both transabdominal and transvaginal ultrasound examinations of the pelvis were performed. Transabdominal technique was performed for global imaging of the pelvis including uterus, ovaries, adnexal regions, and pelvic cul-de-sac. It was necessary to proceed with endovaginal exam following the transabdominal exam to visualize  the uterus, endometrium, ovaries and adnexa. Color and duplex Doppler ultrasound was utilized to evaluate blood flow to the ovaries. COMPARISON:  CT May 07, 2021. FINDINGS: Uterus Measurements: 9.8 x 6.1 x 4.0 cm = volume: 126 mL. No fibroids or other mass visualized. Endometrium Thickness: 7 mm.  No focal abnormality visualized. Right ovary Measurements: 3.3 x 2.6 x 2.6 cm = volume: 11.8 mL. Normal appearance/no adnexal mass. Left ovary Measurements: 4.2 x 2.4 x 2.2 cm = volume: 11.3 mL. Normal appearance/no adnexal mass. Pulsed Doppler evaluation of both ovaries demonstrates normal low-resistance arterial and venous waveforms. Other findings Small volume pelvic free fluid is within physiologic normal limits. IMPRESSION: Negative pelvic ultrasound, specifically no evidence of ovarian torsion. Electronically Signed   By: Dahlia Bailiff M.D.   On: 02/28/2022 11:18    Procedures Procedures    Medications Ordered in ED Medications  cefTRIAXone (ROCEPHIN) injection 500 mg (has no administration in time range)    ED Course/ Medical Decision Making/ A&P                           Medical Decision Making Problems Addressed: Abnormal vaginal bleeding: acute illness or injury CMT (cervical motion tenderness): acute illness or injury  Amount and/or Complexity of Data  Reviewed Labs: ordered. Radiology: ordered.  Risk Prescription drug management.   This patient presents to the ED with chief complaint(s) of vaginal bleeding, fatigue, lower abdominal pain with pertinent past medical history of tubal ligation, prior C-section, bacterial vaginosis.The complaint involves an extensive differential diagnosis and also carries with it a high risk of complications and morbidity.    The differential diagnosis includes PID, vaginitis, STI, uterine fibroids, adenomyosis, dysfunctional uterine bleeding, PCOS   The initial plan is to perform pelvic exam, obtain GC/chlamydia, wet prep; order RPR, UA, urine pregnancy and CBC  Additional history obtained: Records reviewed Primary Care Documents  Initial Assessment:  Upon physical exam, patient has tenderness over lower abdominal quadrants and suprapubic region.  She is mildly tachycardic, pulses normal, heart sounds normal.  LCTAB, no respiratory distress.  She appears to be resting comfortably.  Pelvic exam was performed, dark red blood visualized coming from cervical os and pooling in vaginal vault.  No erythema or lesions.  CMT+.  Multiparous cervix without discharge, lesions, friability.  Exquisite tenderness across lower abdomen during bimanual exam.  Patient stated during exam she feels the urge to urinate.   Independent labs interpretation:  The following labs were independently interpreted: CBC reveals microcytic anemia, Hgb 9.7; elevated platelets 521 GC/Chlamydia and RPR obtained, still pending. Wet prep negative UA negative for leukocytes, WBC; rare bacteria; moderate Hgb (expected given vaginal bleeding).  Sent for culture.  Independent visualization and interpretation of imaging: I independently visualized the following imaging with scope of interpretation limited to determining acute life threatening conditions related to emergency care: US transvaginal, which revealed no ovarian torsion, uterine  fibroids, or masses.  Disposition: Based on patient presentation, work-up, and pain throughout pelvic and bimanual exam, plan to treat patient empirically for PID.  Urine sent for culture.  Will advise patient to begin daily iron supplement for microcytic anemia.  Patient will be informed of GC/chlamydia and RPR results.  Treatment may be altered based on these results.  Advised patient to follow-up with gynecology for further assessment and management of vaginal bleeding.  Patient currently does not have any emergent or life-threatening conditions, she is hemodynamically stable, and appropriate for discharge.  Return  precautions provided.   Discussed HPI, physical exam, assessment and plan with attending, Alona Bene.  The attending physician agrees with plan.       Final Clinical Impression(s) / ED Diagnoses Final diagnoses:  Abnormal vaginal bleeding  CMT (cervical motion tenderness)    Rx / DC Orders ED Discharge Orders          Ordered    doxycycline (VIBRAMYCIN) 100 MG capsule  2 times daily        02/28/22 1138    metroNIDAZOLE (FLAGYL) 500 MG tablet  2 times daily        02/28/22 1138              Lenard Simmer, [place] 02/28/22 1151    Melton Alar [place], [place] 02/28/22 1316    Maia Plan, MD 03/01/22 1323

## 2022-02-28 NOTE — Discharge Instructions (Addendum)
You were seen in the ER for abnormal vaginal bleeding.  You are anemic, I recommend starting on a daily iron supplement.  Your ultrasound was normal.  Some of your test results are pending, you will be called with these results.   Based on your exam, I am going to empirically treat you for pelvic inflammatory disease with antibiotics.  I am also referring you to [facility] for follow-up.  Please call their office to schedule an appointment and let them know you were seen in the ER.   Return to the ER if you experience new or worsening symptoms.

## 2022-03-01 LAB — RPR: RPR Ser Ql: NONREACTIVE

## 2022-03-01 LAB — URINE CULTURE: Culture: NO GROWTH

## 2022-03-01 LAB — GC/CHLAMYDIA PROBE AMP (~~LOC~~) NOT AT ARMC
Chlamydia: NEGATIVE
Comment: NEGATIVE
Comment: NORMAL
Neisseria Gonorrhea: NEGATIVE

## 2022-03-02 ENCOUNTER — Ambulatory Visit: Payer: Medicaid Other | Attending: Student | Admitting: Physical Therapy

## 2022-03-02 ENCOUNTER — Encounter: Payer: Self-pay | Admitting: Physical Therapy

## 2022-03-02 DIAGNOSIS — R262 Difficulty in walking, not elsewhere classified: Secondary | ICD-10-CM | POA: Diagnosis present

## 2022-03-02 DIAGNOSIS — R600 Localized edema: Secondary | ICD-10-CM | POA: Diagnosis present

## 2022-03-02 DIAGNOSIS — R2689 Other abnormalities of gait and mobility: Secondary | ICD-10-CM | POA: Insufficient documentation

## 2022-03-02 DIAGNOSIS — M6281 Muscle weakness (generalized): Secondary | ICD-10-CM | POA: Diagnosis not present

## 2022-03-02 DIAGNOSIS — M25671 Stiffness of right ankle, not elsewhere classified: Secondary | ICD-10-CM | POA: Diagnosis present
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
## 2022-03-09 ENCOUNTER — Ambulatory Visit: Payer: Medicaid Other | Admitting: Physical Therapy

## 2022-03-14 ENCOUNTER — Ambulatory Visit: Payer: Medicaid Other | Admitting: Physical Therapy
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
## 2022-03-17 ENCOUNTER — Ambulatory Visit: Payer: Self-pay | Admitting: Student

## 2022-03-17 ENCOUNTER — Encounter (HOSPITAL_COMMUNITY): Payer: Self-pay | Admitting: Student

## 2022-03-17 ENCOUNTER — Other Ambulatory Visit: Payer: Self-pay

## 2022-03-17 NOTE — Progress Notes (Signed)
Spoke with pt for pre-op call. Pt denies cardiac history. States only time she has had HTN was when she was pregnant with triplets 4 years ago.   Pt states she is not diabetic.   Shower instructions given to pt and she voiced understanding.

## 2022-03-17 NOTE — Anesthesia Preprocedure Evaluation (Addendum)
Anesthesia Evaluation  Patient identified by MRN, date of birth, ID band Patient awake    Reviewed: Allergy & Precautions, NPO status , Patient's Chart, lab work & pertinent test results  History of Anesthesia Complications (+) PONV and history of anesthetic complications  Airway Mallampati: III  TM Distance: >3 FB Neck ROM: Full    Dental no notable dental hx.    Pulmonary asthma , sleep apnea , Current Smoker and Patient abstained from smoking.   Pulmonary exam normal breath sounds clear to auscultation       Cardiovascular hypertension, (-) angina (-) Past MI and (-) Cardiac Stents (-) dysrhythmias  Rhythm:Regular Rate:Normal     Neuro/Psych  PSYCHIATRIC DISORDERS Anxiety  Bipolar Disorder Schizophrenia  negative neurological ROS     GI/Hepatic Neg liver ROS,GERD  ,,  Endo/Other  negative endocrine ROS    Renal/GU negative Renal ROS     Musculoskeletal  (+) Arthritis ,    Abdominal  (+) + obese  Peds  Hematology  (+) Blood dyscrasia, anemia   Anesthesia Other Findings 31 year old female status post right distal tibia nonunion repair now presenting with broken hardware  Reproductive/Obstetrics                             Anesthesia Physical Anesthesia Plan  ASA: 2  Anesthesia Plan: Regional and MAC   Post-op Pain Management: Regional block*   Induction: Intravenous  PONV Risk Score and Plan: 2 and Ondansetron and Dexamethasone  Airway Management Planned: Natural Airway and Simple Face Mask  Additional Equipment:   Intra-op Plan:   Post-operative Plan:   Informed Consent: I have reviewed the patients History and Physical, chart, labs and discussed the procedure including the risks, benefits and alternatives for the proposed anesthesia with the patient or authorized representative who has indicated his/her understanding and acceptance.     Dental advisory given  Plan  Discussed with: CRNA and Anesthesiologist  Anesthesia Plan Comments: (Discussed potential risks of nerve blocks including, but not limited to, infection, bleeding, nerve damage, seizures, pneumothorax, respiratory depression, and potential failure of the block. Alternatives to nerve blocks discussed. All questions answered.  Discussed with patient risks of MAC including, but not limited to, minor pain or discomfort, hearing people in the room, and possible need for backup general anesthesia. Risks for general anesthesia also discussed including, but not limited to, sore throat, hoarse voice, chipped/damaged teeth, injury to vocal cords, nausea and vomiting, allergic reactions, lung infection, heart attack, stroke, and death. All questions answered.  The patient does not want general anesthesia. Screw is on medial ankle. Will do adductor canal block and sedation.)       Anesthesia Quick Evaluation

## 2022-03-17 NOTE — H&P (Signed)
[facility] ([facility]) H&P  Patient ID: Shelbey Spindler MRN: [id number] DOB/AGE: May 18, 1990 31 y.o.  Reason for surgery: Broken hardware right ankle  HPI: Cloyce Paterson is an 31 y.o. female presenting for removal of hardware from right ankle.  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  She has done well over the last 3 months and has been progressing to weightbearing as tolerated.  At most recent visit in OTS clinic, one of the distal screws was shown to have broken.  All other hardware remains in appropriate position.  The patient was initially not having pain despite the the screw being broken, and was able to continue weightbearing as tolerated without pain.  Over the last 1 week, patient has noted that the screw seems to have backed out further and is now pushing on the skin of her lateral ankle.  She is having increased pain in the area and presents now for hardware removal.  Currently ambulating with a cane.  Not on any anticoagulation.  No history of diabetes or hypertension  Past Medical History:  Diagnosis Date   Anemia    Anxiety    Arthritis    Asthma    Bipolar 1 disorder (HCC)    COVID 2022   mild   GERD (gastroesophageal reflux disease)    Hyperlipidemia    Preeclampsia    Schizophrenia (HCC)    Sleep apnea    does not use a cpap any longer    Past Surgical History:  Procedure Laterality Date   CESAREAN SECTION     EXTERNAL FIXATION LEG Right 05/07/2021   Procedure: EXTERNAL FIXATION AND IRRIGATION AND DEBRIDMENT  PILON;  Surgeon: Roby Lofts, MD;  Location: [facility] OR;  Service: Orthopedics;  Laterality: Right;   EXTERNAL FIXATION REMOVAL Right 05/10/2021   Procedure: REMOVAL EXTERNAL FIXATION LEG;  Surgeon: Roby Lofts, MD;  Location: [facility] OR;  Service: Orthopedics;  Laterality: Right;   HARDWARE REMOVAL Right 12/15/2021   Procedure: HARDWARE REMOVAL RIGHT TIBIA;  Surgeon: Roby Lofts, MD;  Location:  [facility] OR;  Service: Orthopedics;  Laterality: Right;   OPEN REDUCTION INTERNAL FIXATION (ORIF) TIBIA/FIBULA FRACTURE Right 05/10/2021   Procedure: OPEN REDUCTION INTERNAL FIXATION (ORIF) TIBIA/FIBULA FRACTURE;  Surgeon: Roby Lofts, MD;  Location: [facility] OR;  Service: Orthopedics;  Laterality: Right;   OPEN REDUCTION INTERNAL FIXATION (ORIF) TIBIA/FIBULA FRACTURE Right 12/15/2021   Procedure: 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;  Surgeon: Roby Lofts, MD;  Location: [facility] OR;  Service: Orthopedics;  Laterality: Right;   ORIF PATELLA Left 05/07/2021   Procedure: OPEN REDUCTION INTERNAL (ORIF) FIXATION PATELLA;  Surgeon: Roby Lofts, MD;  Location: [facility] OR;  Service: Orthopedics;  Laterality: Left;   TUBAL LIGATION      No family history on file.  Social History:  reports that she has been smoking cigarettes. She has been smoking an average of 1 pack per day. She has never used smokeless tobacco. She reports current alcohol use. She reports that she does not use drugs.  Allergies:  Allergies  Allergen Reactions   Shellfish Allergy Hives, Itching and Rash   Amoxicillin Rash and Hives    Medications: I have reviewed the patient's current medications. Prior to Admission:  No medications prior to admission.    ROS: Constitutional: No fever or chills Vision: No changes in vision ENT: No difficulty swallowing CV: No chest pain Pulm: No SOB or wheezing GI: No  nausea or vomiting GU: No urgency or inability to hold urine Skin: No poor wound healing Neurologic: No numbness or tingling Psychiatric: No depression or anxiety Heme: No bruising Allergic: No reaction to medications or food   Exam: There were no vitals taken for this visit. General: No acute distress Orientation: Alert and oriented x4 Mood and Affect: Mood and affect appropriate, pleasant and cooperative Gait: Slightly antalgic Coordination and balance: Within normal limits  Right lower extremity:  Well-healing surgical incisions about the ankle.  Lateral ankle with visible screw head under the skin surface.  There are no open wounds and the screw head has not broken the skin surface.  Moderately tender over this area of hardware prominence.  Otherwise no significant tenderness about the ankle.  Tolerates gentle ankle range of motion but again this causes discomfort in the area of the prominent screw.  Neurovascularly intact.  Left lower extremity: Skin without lesions. No tenderness to palpation. Full painless ROM, full strength in each muscle group without evidence of instability.  Motor and sensory function at baseline.  Neurovascularly intact   Medical Decision Making: Data: Imaging: AP, lateral, mortise view of the right ankle shows distal tibial hardware in place.  One of the distal screws has broken and migrated towards the lateral ankle.  On imaging, broken screw head appears just below the skin surface.  Labs: No results found for this or any previous visit (from the past 168 hour(s)).   Assessment/Plan: 31 year old female status post right distal tibia nonunion repair now presenting with broken hardware  One of the distal screws which has been confirmed on x-ray to be broken has started to back out and is now pushing on the patient's skin.  This is causing extreme discomfort and is at risk of coming through the skin if we do not pursue formal intervention.  Recommend proceeding with removal this single screw.  We will leave all the hardware in place.  Risk and benefits of the procedure have been discussed with the patient.  She agrees to proceed with surgery.  Consent will be obtained.  We will plan to discharge the patient home from the PACU postoperatively.   Thompson Caul PA-C [facility] [phone] (office) [email or web address]

## 2022-03-18 ENCOUNTER — Ambulatory Visit (HOSPITAL_BASED_OUTPATIENT_CLINIC_OR_DEPARTMENT_OTHER): Payer: Medicaid Other | Admitting: Certified Registered Nurse Anesthetist

## 2022-03-18 ENCOUNTER — Ambulatory Visit (HOSPITAL_COMMUNITY)
Admission: RE | Admit: 2022-03-18 | Discharge: 2022-03-18 | Disposition: A | Discharge status: Home / Self Care | Payer: Medicaid Other | Source: Ambulatory Visit | Attending: Student | Admitting: Student

## 2022-03-18 ENCOUNTER — Ambulatory Visit (HOSPITAL_COMMUNITY): Payer: Medicaid Other | Admitting: Certified Registered Nurse Anesthetist

## 2022-03-18 ENCOUNTER — Encounter (HOSPITAL_COMMUNITY)
Admission: RE | Disposition: A | Discharge status: Home / Self Care | Payer: Self-pay | Source: Ambulatory Visit | Attending: Student

## 2022-03-18 ENCOUNTER — Ambulatory Visit (HOSPITAL_COMMUNITY): Payer: Medicaid Other

## 2022-03-18 ENCOUNTER — Other Ambulatory Visit: Payer: Self-pay

## 2022-03-18 ENCOUNTER — Encounter (HOSPITAL_COMMUNITY): Payer: Self-pay | Admitting: Student

## 2022-03-18 DIAGNOSIS — F17210 Nicotine dependence, cigarettes, uncomplicated: Secondary | ICD-10-CM | POA: Diagnosis not present

## 2022-03-18 DIAGNOSIS — E669 Obesity, unspecified: Secondary | ICD-10-CM | POA: Diagnosis not present

## 2022-03-18 DIAGNOSIS — I10 Essential (primary) hypertension: Secondary | ICD-10-CM | POA: Diagnosis not present

## 2022-03-18 DIAGNOSIS — T84213A Breakdown (mechanical) of internal fixation device of bones of foot and toes, initial encounter: Secondary | ICD-10-CM

## 2022-03-18 DIAGNOSIS — G4730 Sleep apnea, unspecified: Secondary | ICD-10-CM | POA: Insufficient documentation

## 2022-03-18 DIAGNOSIS — Y831 Surgical operation with implant of artificial internal device as the cause of abnormal reaction of the patient, or of later complication, without mention of misadventure at the time of the procedure: Secondary | ICD-10-CM | POA: Diagnosis not present

## 2022-03-18 DIAGNOSIS — J45909 Unspecified asthma, uncomplicated: Secondary | ICD-10-CM | POA: Insufficient documentation

## 2022-03-18 DIAGNOSIS — Z6835 Body mass index (BMI) 35.0-35.9, adult: Secondary | ICD-10-CM | POA: Insufficient documentation

## 2022-03-18 DIAGNOSIS — K219 Gastro-esophageal reflux disease without esophagitis: Secondary | ICD-10-CM | POA: Diagnosis not present

## 2022-03-18 DIAGNOSIS — S82891D Other fracture of right lower leg, subsequent encounter for closed fracture with routine healing: Secondary | ICD-10-CM | POA: Diagnosis present

## 2022-03-18 HISTORY — DX: Other specified postprocedural states: Z98.890

## 2022-03-18 HISTORY — PX: HARDWARE REMOVAL: SHX979

## 2022-03-18 LAB — POCT PREGNANCY, URINE: Preg Test, Ur: NEGATIVE

## 2022-03-18 LAB — SURGICAL PCR SCREEN
MRSA, PCR: POSITIVE — AB
Staphylococcus aureus: POSITIVE — AB

## 2022-03-18 SURGERY — REMOVAL, HARDWARE
Anesthesia: Monitor Anesthesia Care | Laterality: Right

## 2022-03-18 MED ORDER — ROPIVACAINE HCL 5 MG/ML IJ SOLN
INTRAMUSCULAR | Status: DC | PRN
  Administered 2022-03-18: 20 mL via PERINEURAL

## 2022-03-18 MED ORDER — LACTATED RINGERS IV SOLN: INTRAVENOUS | Status: DC

## 2022-03-18 MED ORDER — MUPIROCIN 2 % EX OINT
TOPICAL_OINTMENT | CUTANEOUS | Status: AC
  Filled 2022-03-18: qty 22

## 2022-03-18 MED ORDER — MIDAZOLAM HCL 2 MG/2ML IJ SOLN
INTRAMUSCULAR | Status: DC | PRN
  Administered 2022-03-18: 2 mg via INTRAVENOUS

## 2022-03-18 MED ORDER — DEXMEDETOMIDINE HCL IN NACL 80 MCG/20ML IV SOLN
INTRAVENOUS | Status: AC
  Filled 2022-03-18: qty 20

## 2022-03-18 MED ORDER — OXYCODONE-ACETAMINOPHEN 5-325 MG PO TABS: 1.0000 | ORAL_TABLET | Freq: Four times a day (QID) | ORAL | 0 refills | Status: AC | PRN

## 2022-03-18 MED ORDER — PROPOFOL 500 MG/50ML IV EMUL
INTRAVENOUS | Status: DC | PRN
  Administered 2022-03-18: 100 ug/kg/min via INTRAVENOUS

## 2022-03-18 MED ORDER — DEXAMETHASONE SODIUM PHOSPHATE 10 MG/ML IJ SOLN
INTRAMUSCULAR | Status: DC | PRN
  Administered 2022-03-18: 4 mg via INTRAVENOUS

## 2022-03-18 MED ORDER — CEFAZOLIN SODIUM-DEXTROSE 2-4 GM/100ML-% IV SOLN
2.0000 g | INTRAVENOUS | Status: AC
  Administered 2022-03-18: 2 g via INTRAVENOUS
  Filled 2022-03-18: qty 100

## 2022-03-18 MED ORDER — BUPIVACAINE-EPINEPHRINE (PF) 0.25% -1:200000 IJ SOLN
INTRAMUSCULAR | Status: AC
  Filled 2022-03-18: qty 30

## 2022-03-18 MED ORDER — VANCOMYCIN HCL 1000 MG IV SOLR
INTRAVENOUS | Status: AC
  Filled 2022-03-18: qty 20

## 2022-03-18 MED ORDER — OXYCODONE HCL 5 MG/5ML PO SOLN: 5.0000 mg | Freq: Once | ORAL | Status: DC | PRN

## 2022-03-18 MED ORDER — OXYCODONE HCL 5 MG PO TABS: 5.0000 mg | ORAL_TABLET | Freq: Once | ORAL | Status: DC | PRN

## 2022-03-18 MED ORDER — FENTANYL CITRATE (PF) 100 MCG/2ML IJ SOLN: 25.0000 ug | INTRAMUSCULAR | Status: DC | PRN

## 2022-03-18 MED ORDER — PROPOFOL 10 MG/ML IV BOLUS
INTRAVENOUS | Status: DC | PRN
  Administered 2022-03-18: 30 mg via INTRAVENOUS
  Administered 2022-03-18: 25 mg via INTRAVENOUS
  Administered 2022-03-18 (×2): 30 mg via INTRAVENOUS

## 2022-03-18 MED ORDER — DEXMEDETOMIDINE HCL IN NACL 80 MCG/20ML IV SOLN
INTRAVENOUS | Status: DC | PRN
  Administered 2022-03-18: 8 ug via BUCCAL
  Administered 2022-03-18: 4 ug via BUCCAL
  Administered 2022-03-18: 8 ug via BUCCAL

## 2022-03-18 MED ORDER — BUPIVACAINE-EPINEPHRINE (PF) 0.25% -1:200000 IJ SOLN
INTRAMUSCULAR | Status: DC | PRN
  Administered 2022-03-18: 2 mL

## 2022-03-18 MED ORDER — PROMETHAZINE HCL 25 MG/ML IJ SOLN: 6.2500 mg | INTRAMUSCULAR | Status: DC | PRN

## 2022-03-18 MED ORDER — ONDANSETRON HCL 4 MG/2ML IJ SOLN
INTRAMUSCULAR | Status: DC | PRN
  Administered 2022-03-18: 4 mg via INTRAVENOUS

## 2022-03-18 MED ORDER — ORAL CARE MOUTH RINSE: 15.0000 mL | Freq: Once | OROMUCOSAL | Status: AC

## 2022-03-18 MED ORDER — CHLORHEXIDINE GLUCONATE 0.12 % MT SOLN
15.0000 mL | Freq: Once | OROMUCOSAL | Status: AC
  Administered 2022-03-18: 15 mL via OROMUCOSAL
  Filled 2022-03-18: qty 15

## 2022-03-18 MED ORDER — FENTANYL CITRATE (PF) 250 MCG/5ML IJ SOLN
INTRAMUSCULAR | Status: AC
  Filled 2022-03-18: qty 5

## 2022-03-18 MED ORDER — LIDOCAINE 2% (20 MG/ML) 5 ML SYRINGE
INTRAMUSCULAR | Status: DC | PRN
  Administered 2022-03-18: 40 mg via INTRAVENOUS

## 2022-03-18 MED ORDER — MIDAZOLAM HCL 2 MG/2ML IJ SOLN
INTRAMUSCULAR | Status: AC
  Filled 2022-03-18: qty 2

## 2022-03-18 MED ORDER — PROPOFOL 10 MG/ML IV BOLUS
INTRAVENOUS | Status: AC
  Filled 2022-03-18: qty 20

## 2022-03-18 MED ORDER — 0.9 % SODIUM CHLORIDE (POUR BTL) OPTIME
TOPICAL | Status: DC | PRN
  Administered 2022-03-18: 1000 mL

## 2022-03-18 MED ORDER — FENTANYL CITRATE (PF) 250 MCG/5ML IJ SOLN
INTRAMUSCULAR | Status: DC | PRN
  Administered 2022-03-18 (×3): 50 ug via INTRAVENOUS

## 2022-03-18 SURGICAL SUPPLY — 56 items
BAG COUNTER SPONGE SURGICOUNT (BAG) ×1 IMPLANT
BANDAGE ESMARK 6X9 LF (GAUZE/BANDAGES/DRESSINGS) ×1 IMPLANT
BNDG COHESIVE 6X5 TAN STRL LF (GAUZE/BANDAGES/DRESSINGS) ×1 IMPLANT
BNDG ELASTIC 4X5.8 VLCR STR LF (GAUZE/BANDAGES/DRESSINGS) ×1 IMPLANT
BNDG ELASTIC 6X5.8 VLCR STR LF (GAUZE/BANDAGES/DRESSINGS) ×1 IMPLANT
BNDG ESMARK 6X9 LF (GAUZE/BANDAGES/DRESSINGS) ×1
BNDG GAUZE DERMACEA FLUFF 4 (GAUZE/BANDAGES/DRESSINGS) ×2 IMPLANT
BRUSH SCRUB EZ PLAIN DRY (MISCELLANEOUS) ×2 IMPLANT
CHLORAPREP W/TINT 26 (MISCELLANEOUS) ×1 IMPLANT
COVER SURGICAL LIGHT HANDLE (MISCELLANEOUS) ×2 IMPLANT
CUFF TOURN SGL QUICK 18X4 (TOURNIQUET CUFF) IMPLANT
CUFF TOURN SGL QUICK 24 (TOURNIQUET CUFF)
CUFF TOURN SGL QUICK 34 (TOURNIQUET CUFF)
CUFF TRNQT CYL 24X4X16.5-23 (TOURNIQUET CUFF) IMPLANT
CUFF TRNQT CYL 34X4.125X (TOURNIQUET CUFF) IMPLANT
DRAPE C-ARM 42X72 X-RAY (DRAPES) IMPLANT
DRAPE C-ARMOR (DRAPES) ×1 IMPLANT
DRAPE U-SHAPE 47X51 STRL (DRAPES) ×1 IMPLANT
DRESSING MEPILEX FLEX 4X4 (GAUZE/BANDAGES/DRESSINGS) IMPLANT
DRSG ADAPTIC 3X8 NADH LF (GAUZE/BANDAGES/DRESSINGS) ×1 IMPLANT
DRSG MEPILEX FLEX 4X4 (GAUZE/BANDAGES/DRESSINGS) ×1
ELECT REM PT RETURN 9FT ADLT (ELECTROSURGICAL) ×1
ELECTRODE REM PT RTRN 9FT ADLT (ELECTROSURGICAL) ×1 IMPLANT
GAUZE SPONGE 4X4 12PLY STRL (GAUZE/BANDAGES/DRESSINGS) ×1 IMPLANT
GLOVE BIO SURGEON STRL SZ 6.5 (GLOVE) ×3 IMPLANT
GLOVE BIO SURGEON STRL SZ7.5 (GLOVE) ×4 IMPLANT
GLOVE BIOGEL PI IND STRL 6.5 (GLOVE) ×1 IMPLANT
GLOVE BIOGEL PI IND STRL 7.5 (GLOVE) ×1 IMPLANT
GOWN STRL REUS W/ TWL LRG LVL3 (GOWN DISPOSABLE) ×2 IMPLANT
GOWN STRL REUS W/TWL LRG LVL3 (GOWN DISPOSABLE) ×2
KIT BASIN OR (CUSTOM PROCEDURE TRAY) ×1 IMPLANT
KIT TURNOVER KIT B (KITS) ×1 IMPLANT
MANIFOLD NEPTUNE II (INSTRUMENTS) ×1 IMPLANT
NDL 22X1.5 STRL (OR ONLY) (MISCELLANEOUS) IMPLANT
NEEDLE 22X1.5 STRL (OR ONLY) (MISCELLANEOUS) IMPLANT
NS IRRIG 1000ML POUR BTL (IV SOLUTION) ×1 IMPLANT
PACK ORTHO EXTREMITY (CUSTOM PROCEDURE TRAY) ×1 IMPLANT
PAD ARMBOARD 7.5X6 YLW CONV (MISCELLANEOUS) ×2 IMPLANT
PADDING CAST COTTON 6X4 STRL (CAST SUPPLIES) ×3 IMPLANT
SPONGE T-LAP 18X18 ~~LOC~~+RFID (SPONGE) ×1 IMPLANT
STAPLER VISISTAT 35W (STAPLE) IMPLANT
STOCKINETTE IMPERVIOUS LG (DRAPES) ×1 IMPLANT
STRIP CLOSURE SKIN 1/2X4 (GAUZE/BANDAGES/DRESSINGS) IMPLANT
SUCTION FRAZIER HANDLE 10FR (MISCELLANEOUS)
SUCTION TUBE FRAZIER 10FR DISP (MISCELLANEOUS) IMPLANT
SUT VIC AB 0 CT1 27 (SUTURE)
SUT VIC AB 0 CT1 27XBRD ANBCTR (SUTURE) IMPLANT
SUT VIC AB 2-0 CT1 27 (SUTURE)
SUT VIC AB 2-0 CT1 TAPERPNT 27 (SUTURE) IMPLANT
SYR CONTROL 10ML LL (SYRINGE) IMPLANT
TOWEL GREEN STERILE (TOWEL DISPOSABLE) ×2 IMPLANT
TOWEL GREEN STERILE FF (TOWEL DISPOSABLE) ×2 IMPLANT
TUBE CONNECTING 12X1/4 (SUCTIONS) ×1 IMPLANT
UNDERPAD 30X36 HEAVY ABSORB (UNDERPADS AND DIAPERS) ×1 IMPLANT
WATER STERILE IRR 1000ML POUR (IV SOLUTION) ×2 IMPLANT
YANKAUER SUCT BULB TIP NO VENT (SUCTIONS) ×1 IMPLANT

## 2022-03-18 NOTE — Anesthesia Procedure Notes (Addendum)
Anesthesia Regional Block: Adductor canal block   Pre-Anesthetic Checklist: , timeout performed,  Correct Patient, Correct Site, Correct Laterality,  Correct Procedure, Correct Position, site marked,  Risks and benefits discussed,  Surgical consent,  Pre-op evaluation,  At surgeon's request and post-op pain management  Laterality: Right  Prep: chloraprep       Needles:  Injection technique: Single-shot  Needle Type: Echogenic Stimulator Needle     Needle Length: 9cm  Needle Gauge: 21     Additional Needles:   Procedures:,,,, ultrasound used (permanent image in chart),,    Narrative:  Start time: 03/18/2022 7:15 AM End time: 03/18/2022 7:19 AM Injection made incrementally with aspirations every 5 mL.  Performed by: Personally  Anesthesiologist: Linton Rump, MD  Additional Notes: Discussed risks and benefits of nerve block including, but not limited to, prolonged and/or permanent nerve injury involving sensory and/or motor function. Monitors were applied and a time-out was performed. The nerve and associated structures were visualized under ultrasound guidance. After negative aspiration, local anesthetic was slowly injected around the nerve. There was no evidence of high pressure during the procedure. There were no paresthesias. VSS remained stable and the patient tolerated the procedure well.

## 2022-03-18 NOTE — Anesthesia Postprocedure Evaluation (Signed)
Anesthesia Post Note  Patient: Gina Martin  Procedure(s) Performed: HARDWARE REMOVAL RIGHT ANKLE (Right)     Patient location during evaluation: PACU Anesthesia Type: Regional and MAC Level of consciousness: awake Pain management: pain level controlled Vital Signs Assessment: post-procedure vital signs reviewed and stable Respiratory status: spontaneous breathing, nonlabored ventilation and respiratory function stable Cardiovascular status: stable and blood pressure returned to baseline Postop Assessment: no apparent nausea or vomiting Anesthetic complications: no   No notable events documented.  Last Vitals:  Vitals:   03/18/22 0830 03/18/22 0845  BP: 119/82 116/85  Pulse: 94 76  Resp: (!) 26 17  Temp:  36.4 C  SpO2: 100% 100%    Last Pain:  Vitals:   03/18/22 0845  TempSrc:   PainSc: 0-No pain                 Nilda Simmer

## 2022-03-18 NOTE — Progress Notes (Signed)
Received call from [facility] lab for +MRSA/MSSA swab.  Patient has a shellfish allergy so patient did not receive nasal betadine prior to surgery.  1 dose of mupirocin was given in both nares.  Dr. Jena Gauss notified.

## 2022-03-18 NOTE — Discharge Instructions (Signed)
[facility]opaedic Trauma Service Discharge Instructions   General Discharge Instructions  WEIGHT BEARING STATUS: Weightbearing as tolerated   RANGE OF MOTION/ACTIVITY: ok for ankle range of motion as tolerated  Wound Care:You may remove your dressing on Sunday 03/20/22. Incisions can be left open to air if there is no drainage. If incision continues to have drainage, follow wound care instructions below. Okay to shower if no drainage from incisions.  DVT/PE prophylaxis: None  Diet: as you were eating previously.  Can use over the counter stool softeners and bowel preparations, such as Miralax, to help with bowel movements.  Narcotics can be constipating.  Be sure to drink plenty of fluids  PAIN MEDICATION USE AND EXPECTATIONS  You have likely been given narcotic medications to help control your pain.  After a traumatic event that results in an fracture (broken bone) with or without surgery, it is ok to use narcotic pain medications to help control one's pain.  We understand that everyone responds to pain differently and each individual patient will be evaluated on a regular basis for the continued need for narcotic medications. Ideally, narcotic medication use should last no more than 6-8 weeks (coinciding with fracture healing).   As a patient it is your responsibility as well to monitor narcotic medication use and report the amount and frequency you use these medications when you come to your office visit.   We would also advise that if you are using narcotic medications, you should take a dose prior to therapy to maximize you participation.  IF YOU ARE ON NARCOTIC MEDICATIONS IT IS NOT PERMISSIBLE TO OPERATE A MOTOR VEHICLE (MOTORCYCLE/CAR/TRUCK/MOPED) OR HEAVY MACHINERY DO NOT MIX NARCOTICS WITH OTHER CNS (CENTRAL NERVOUS SYSTEM) DEPRESSANTS SUCH AS ALCOHOL   STOP SMOKING OR USING NICOTINE PRODUCTS!!!!  As discussed nicotine severely impairs your body's ability to heal surgical and  traumatic wounds but also impairs bone healing.  Wounds and bone heal by forming microscopic blood vessels (angiogenesis) and nicotine is a vasoconstrictor (essentially, shrinks blood vessels).  Therefore, if vasoconstriction occurs to these microscopic blood vessels they essentially disappear and are unable to deliver necessary nutrients to the healing tissue.  This is one modifiable factor that you can do to dramatically increase your chances of healing your injury.    (This means no smoking, no nicotine gum, patches, etc)  DO NOT USE NONSTEROIDAL ANTI-INFLAMMATORY DRUGS (NSAID'S)  Using products such as Advil (ibuprofen), Aleve (naproxen), Motrin (ibuprofen) for additional pain control during fracture healing can delay and/or prevent the healing response.  If you would like to take over the counter (OTC) medication, Tylenol (acetaminophen) is ok.  However, some narcotic medications that are given for pain control contain acetaminophen as well. Therefore, you should not exceed more than 4000 mg of tylenol in a day if you do not have liver disease.  Also note that there are may OTC medicines, such as cold medicines and allergy medicines that my contain tylenol as well.  If you have any questions about medications and/or interactions please ask your doctor/PA or your pharmacist.      ICE AND ELEVATE INJURED/OPERATIVE EXTREMITY  Using ice and elevating the injured extremity above your heart can help with swelling and pain control.  Icing in a pulsatile fashion, such as 20 minutes on and 20 minutes off, can be followed.    Do not place ice directly on skin. Make sure there is a barrier between to skin and the ice pack.    Using frozen items  such as frozen peas works well as the conform nicely to the are that needs to be iced.  USE AN ACE WRAP OR TED HOSE FOR SWELLING CONTROL  In addition to icing and elevation, Ace wraps or TED hose are used to help limit and resolve swelling.  It is recommended to use  Ace wraps or TED hose until you are informed to stop.    When using Ace Wraps start the wrapping distally (farthest away from the body) and wrap proximally (closer to the body)   Example: If you had surgery on your leg or thing and you do not have a splint on, start the ace wrap at the toes and work your way up to the thigh        If you had surgery on your upper extremity and do not have a splint on, start the ace wrap at your fingers and work your way up to the upper arm   CALL THE OFFICE WITH ANY QUESTIONS OR CONCERNS: [phone]   VISIT OUR WEBSITE FOR ADDITIONAL INFORMATION: [email or web address]     Discharge Wound Care Instructions  Do NOT apply any ointments, solutions or lotions to pin sites or surgical wounds.  These prevent needed drainage and even though solutions like hydrogen peroxide kill bacteria, they also damage cells lining the pin sites that help fight infection.  Applying lotions or ointments can keep the wounds moist and can cause them to breakdown and open up as well. This can increase the risk for infection. When in doubt call the office.  If any drainage is noted, use one layer of adaptic or Mepitel, then gauze, Kerlix, and an ace wrap. - These dressing supplies should be available at local medical supply stores [facility], [facility], etc) as well as Insurance claims handler ([facility], [facility], [place], etc)  Once the incision is completely dry and without drainage, it may be left open to air out.  Showering may begin 36-48 hours later.  Cleaning gently with soap and water.

## 2022-03-18 NOTE — Anesthesia Procedure Notes (Signed)
Procedure Name: MAC Date/Time: 03/18/2022 7:46 AM  Performed by: Colin Benton, CRNAPre-anesthesia Checklist: Patient identified, Emergency Drugs available, Suction available and Patient being monitored Patient Re-evaluated:Patient Re-evaluated prior to induction Oxygen Delivery Method: Simple face mask Induction Type: IV induction Airway Equipment and Method: Oral airway Placement Confirmation: positive ETCO2 Dental Injury: Teeth and Oropharynx as per pre-operative assessment

## 2022-03-18 NOTE — Progress Notes (Signed)
Orthopedic Tech Progress Note Patient Details:  Rabia Argote 1990/09/25 [id number]  PACU RN called and placed a verbal order for a Post op shoe. The shoe was delivered and fitted to the pt with no complaints of pain. Pt stated she had crutches at home and did not want another set of crutches.   Ortho Devices Type of Ortho Device: Postop shoe/boot Ortho Device/Splint Location: RLE Ortho Device/Splint Interventions: Ordered, Application, Adjustment   Post Interventions Patient Tolerated: Well Instructions Provided: Adjustment of device, Care of device  Georg Ruddle 03/18/2022, 8:44 AM

## 2022-03-18 NOTE — Op Note (Signed)
Orthopaedic Surgery Operative Note (CSN: [id number] ) Date of Surgery: 03/18/2022  Admit Date: 03/18/2022   Diagnoses: Pre-Op Diagnoses: Right ankle hardware failure with prominent screw  Post-Op Diagnosis: Same  Procedures: CPT 20680-Removal of hardware right ankle  Surgeons : Primary: Shona Needles, MD  Assistant: Patrecia Pace, PA-C  Location: OR 3   Anesthesia:MAC with local   Antibiotics: Ancef 2g preop   Tourniquet time:None    Estimated Blood Loss: Minimal  Complications:None   Specimens:None  Implants: Implant Name Type Inv. Item Serial No. Manufacturer Lot No. LRB No. Used Action  SCREW LOCK EVOS [id number] - [id number] Screw SCREW LOCK EVOS [id number]  [employer]  Right 1 Explanted     Indications for Surgery: 31 year old female who underwent revision open reduction internal fixation for the distal tibial nonunion in August 2023.  She subsequently developed a prominence of one of the distal screws and had persistent pain in that area.  The screw was prominent risking skin compromise and coming out of the skin.  As result I recommend proceeding to the operating room for removal of the loose screw.  Risks and benefits were discussed with the patient.  Risks included but not limited to bleeding, infection, persistent pain, continued hardware failure, need for further surgery, even the possibility anesthetic complications.  She agreed to proceed with surgery and consent was obtained.  Operative Findings: Successful removal of distal screw without complication.  Procedure: The patient was identified in the preoperative holding area. Consent was confirmed with the patient and their family and all questions were answered. The operative extremity was marked after confirmation with the patient. she was then brought back to the operating room by our anesthesia colleagues.  She was carefully transferred over to radiolucent flat top table.  The right lower  extremity was then prepped and draped in usual sterile fashion.  Timeout was performed to verify the patient, the procedure, and the extremity.  Preoperative antibiotics were dosed.  Fluoroscopic imaging showed the screw that had backed out.  I infiltrated the area with quarter percent Marcaine with epinephrine.  I then made a small incision with a 15 blade.  I exposed the screw head used screwdriver to remove it.  I obtained fluoroscopic imaging showing that there was no other prominent hardware.  The incision was then irrigated and closed with 3-0 nylon.  Sterile dressing was applied.  The patient was then awoke from anesthesia and taken to the PACU in stable condition.  Post Op Plan/Instructions: Patient will be weightbearing as tolerated to right lower extremity.  I will limit physical therapy for the next 2 weeks until we have the wound heal.  She will discharge home from the PACU.  No DVT prophylaxis is needed in this ambulatory patient.  I was present and performed the entire surgery.  Patrecia Pace, PA-C did assist me throughout the case. An assistant was necessary given the difficulty in approach, maintenance of reduction and ability to instrument the fracture.   Katha Hamming, MD [facility]

## 2022-03-18 NOTE — Interval H&P Note (Signed)
History and Physical Interval Note:  03/18/2022 7:30 AM  Gina Martin  has presented today for surgery, with the diagnosis of Broken screw.  The various methods of treatment have been discussed with the patient and family. After consideration of risks, benefits and other options for treatment, the patient has consented to  Procedure(s): HARDWARE REMOVAL RIGHT ANKLE (Right) as a surgical intervention.  The patient's history has been reviewed, patient examined, no change in status, stable for surgery.  I have reviewed the patient's chart and labs.  Questions were answered to the patient's satisfaction.     Caryn Bee P Davisha Linthicum

## 2022-03-18 NOTE — Transfer of Care (Signed)
Immediate Anesthesia Transfer of Care Note  Patient: Gina Martin  Procedure(s) Performed: HARDWARE REMOVAL RIGHT ANKLE (Right)  Patient Location: PACU  Anesthesia Type:MAC combined with regional for post-op pain  Level of Consciousness: awake, alert , and oriented  Airway & Oxygen Therapy: Patient connected to nasal cannula oxygen  Post-op Assessment: Report given to RN, Post -op Vital signs reviewed and stable, and Patient moving all extremities X 4  Post vital signs: Reviewed and stable  Last Vitals:  Vitals Value Taken Time  BP 106/54 03/18/22 0815  Temp    Pulse 93 03/18/22 0817  Resp 21 03/18/22 0817  SpO2 100 % 03/18/22 0817  Vitals shown include unvalidated device data.  Last Pain:  Vitals:   03/18/22 0609  TempSrc:   PainSc: 0-No pain         Complications: No notable events documented.

## 2022-03-21 ENCOUNTER — Encounter (HOSPITAL_COMMUNITY): Payer: Self-pay | Admitting: Student

## 2022-03-31 ENCOUNTER — Ambulatory Visit: Payer: Medicaid Other | Admitting: Physical Therapy

## 2022-04-20 IMAGING — DX DG ANKLE PORT 2V*R*
3 series · 3 of 3 positions shown · non-contrast
Comparison: Previous studies including the examination of
05/07/2021

CLINICAL DATA: Fracture right tibia and fibula

EXAM:
PORTABLE RIGHT ANKLE - 2 VIEW

[ankle ap]
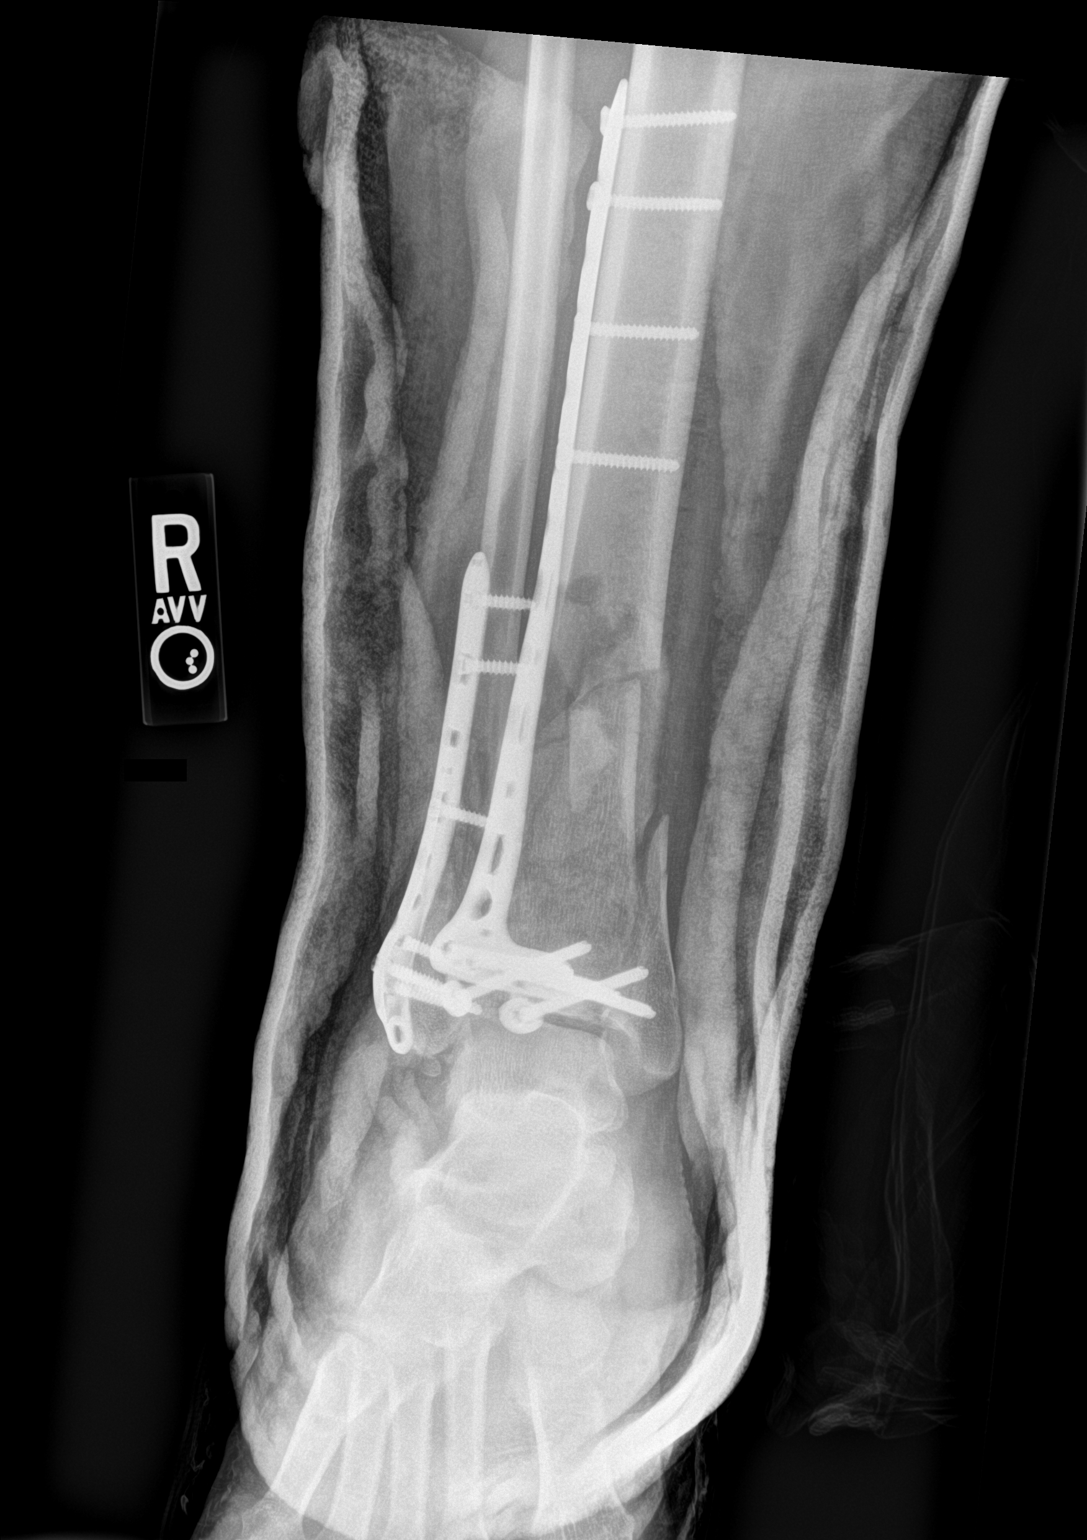

[ankle obl]
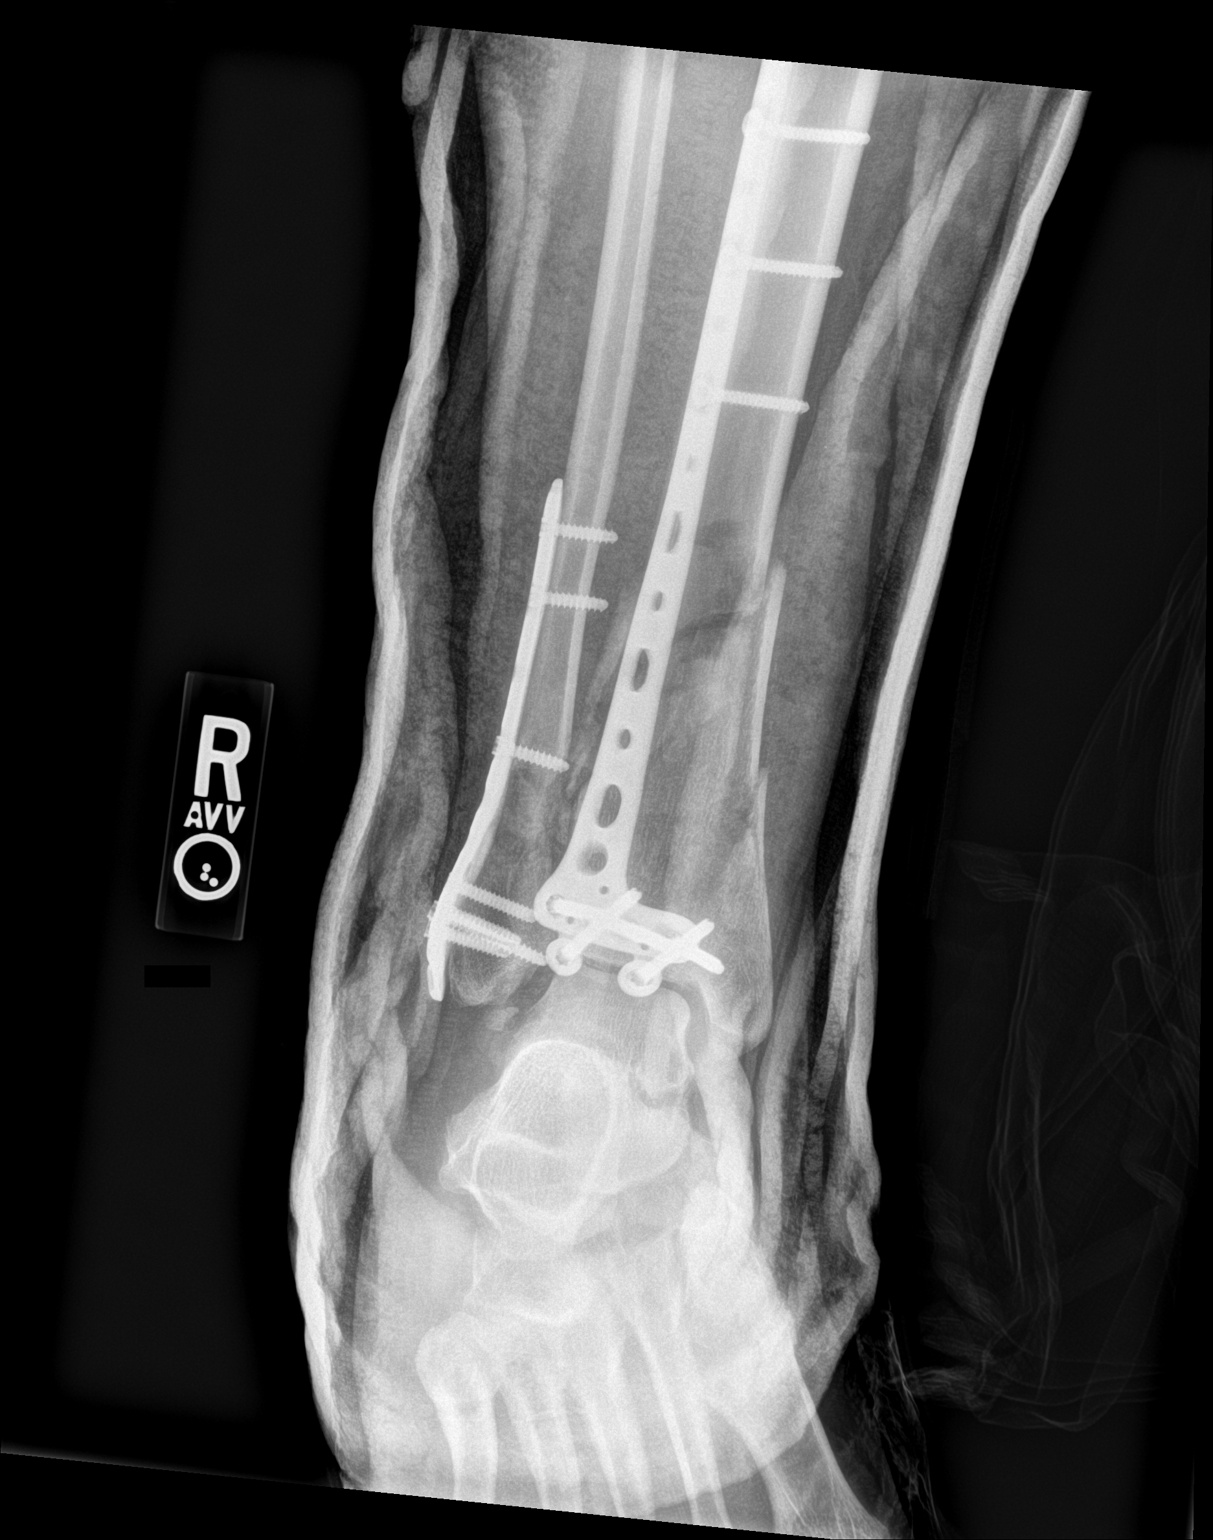

[ankle lat]
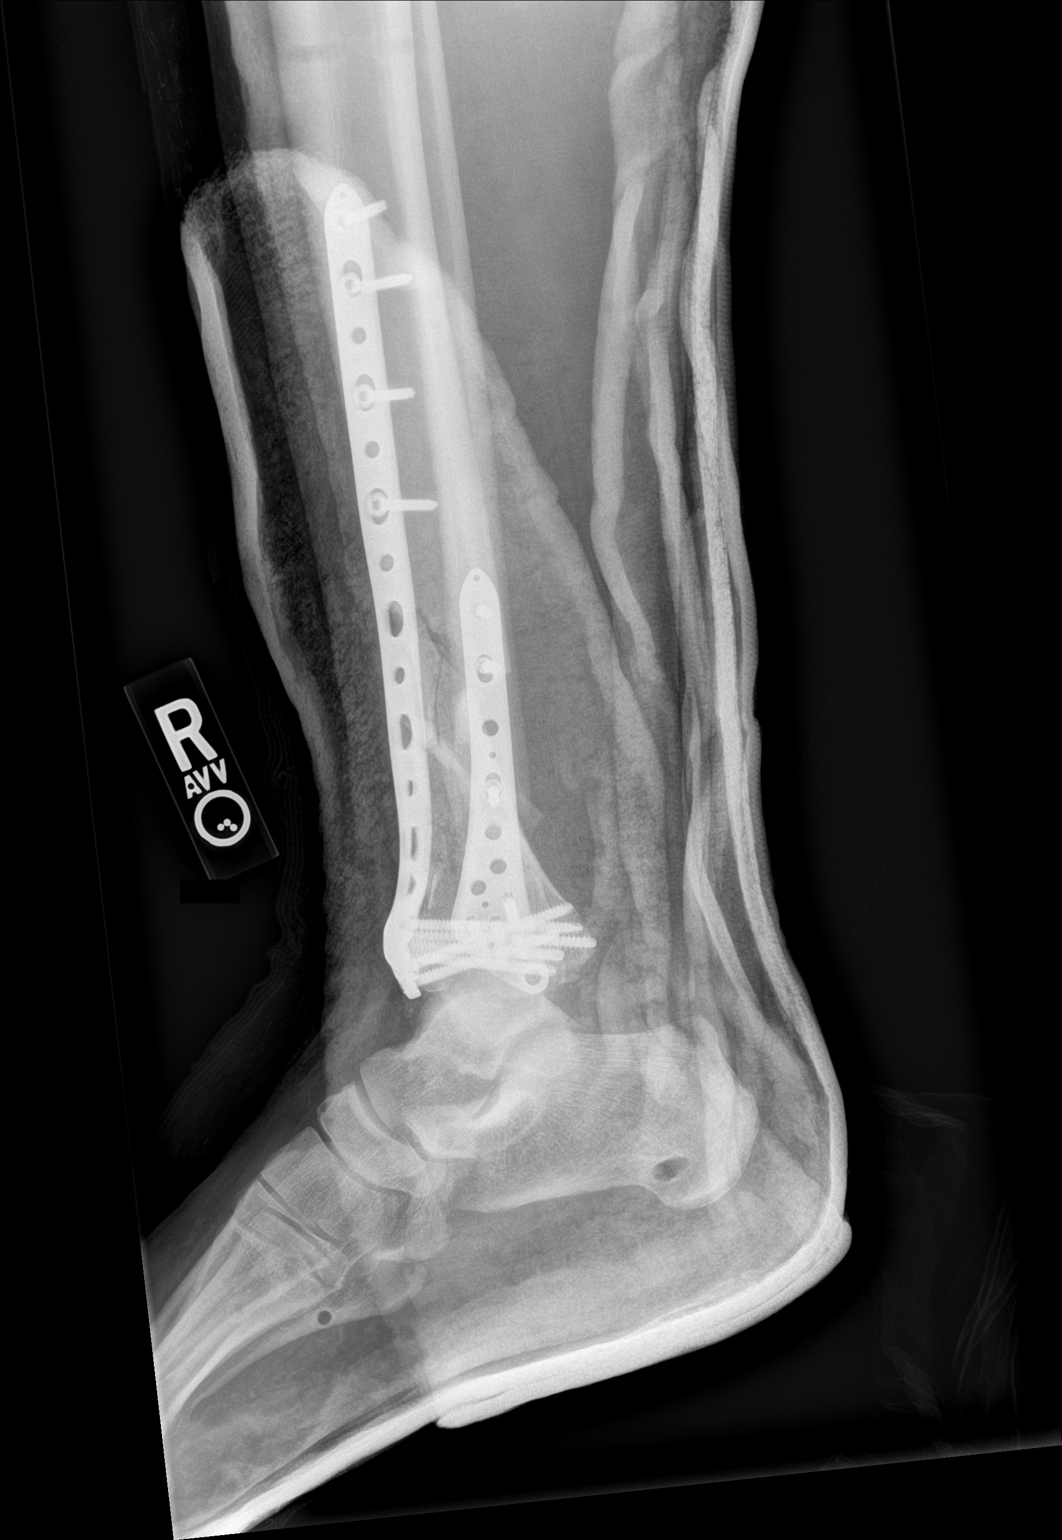

[3 of 3 positions shown; findings below may reference images not displayed]

FINDINGS: There is interval internal fixation of comminuted fractures of
distal shafts and distal ends of right tibia and fibula with
metallic sideplate and multiple surgical screws. There is good
alignment of fracture fragments. There is interval removal of
external immobilization device from the foot.
IMPRESSION: Status post internal fixation of fractures of distal right tibia and
fibula.

## 2022-04-20 IMAGING — RF DG TIBIA/FIBULA 2V*R*
1 series · 13 of 13 positions shown · non-contrast
Comparison: 05/07/2021

CLINICAL DATA: Fracture tibia and fibula

EXAM:
RIGHT TIBIA AND FIBULA - 2 VIEW

[Series 1: run · 13 of 13 slices shown]
[im 1/13]
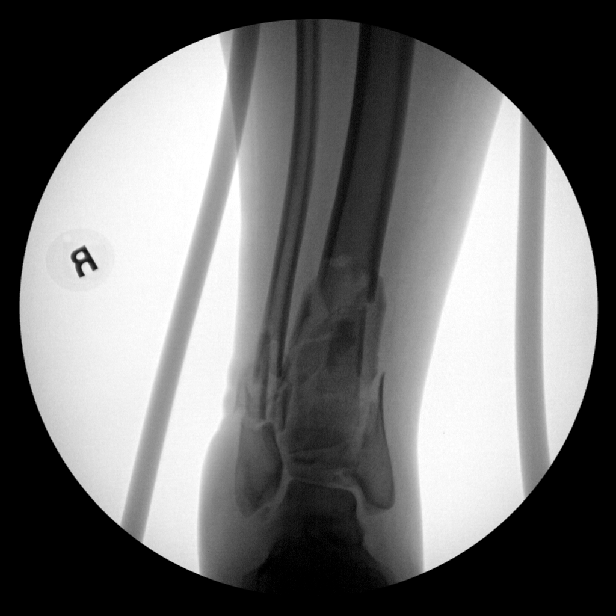
[im 2/13]
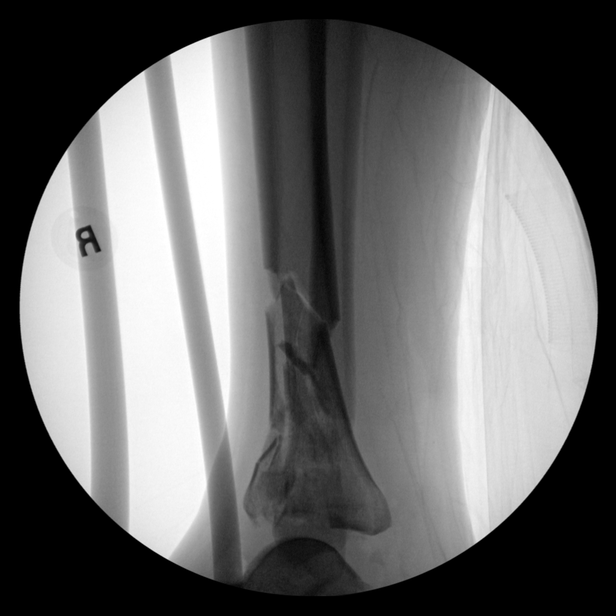
[im 3/13]
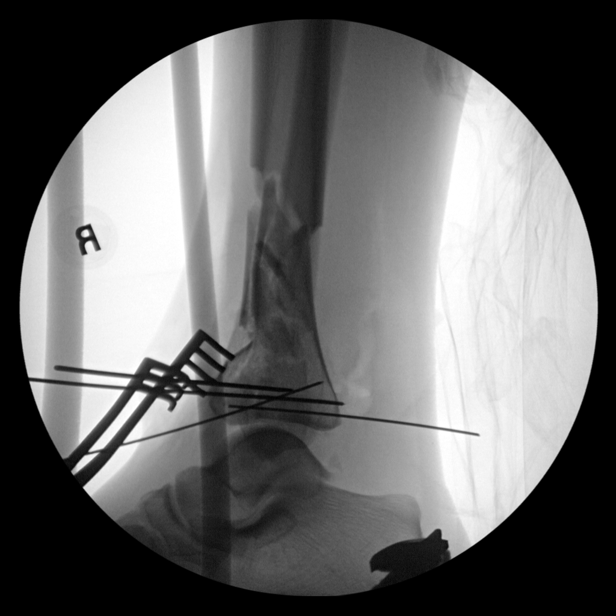
[im 4/13]
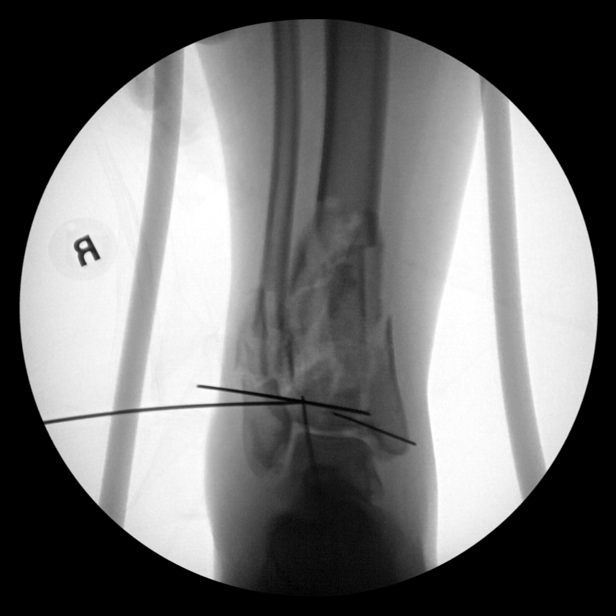
[im 5/13]
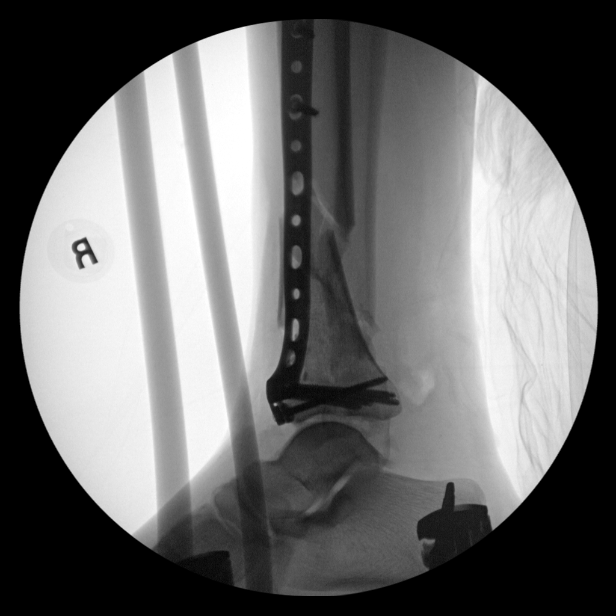
[im 6/13]
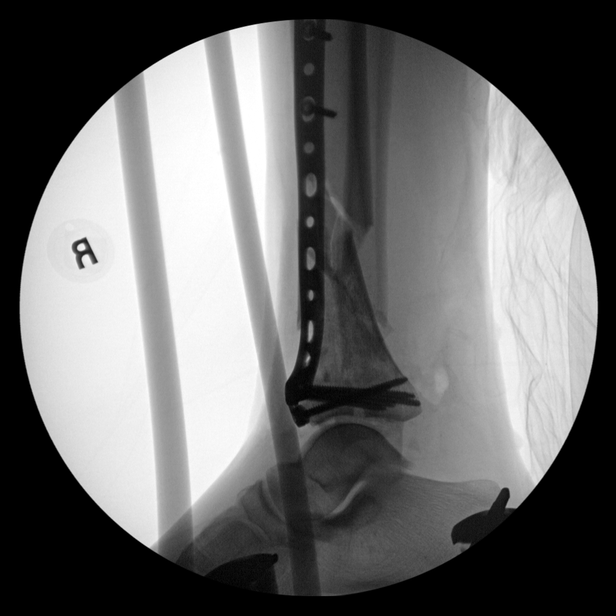
[im 7/13]
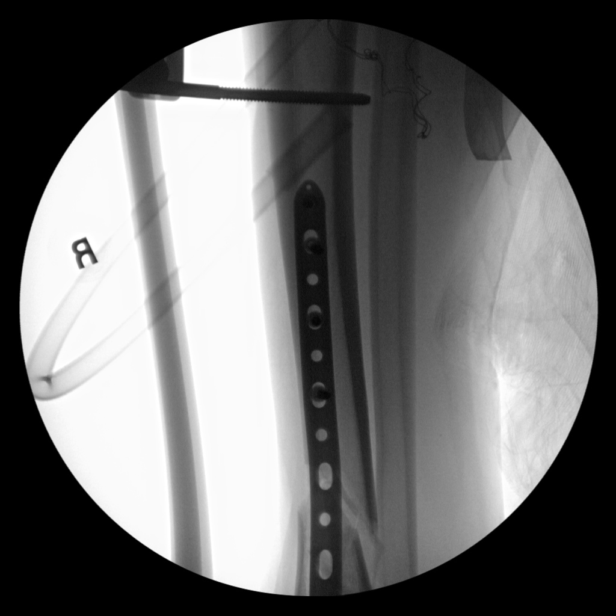
[im 8/13]
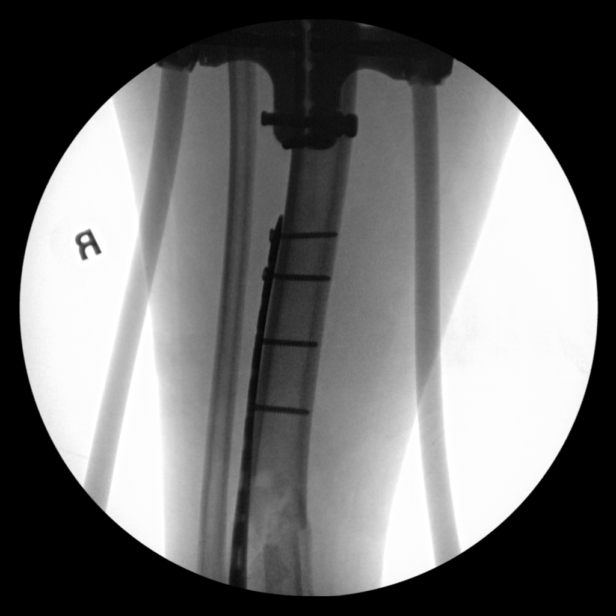
[im 9/13]
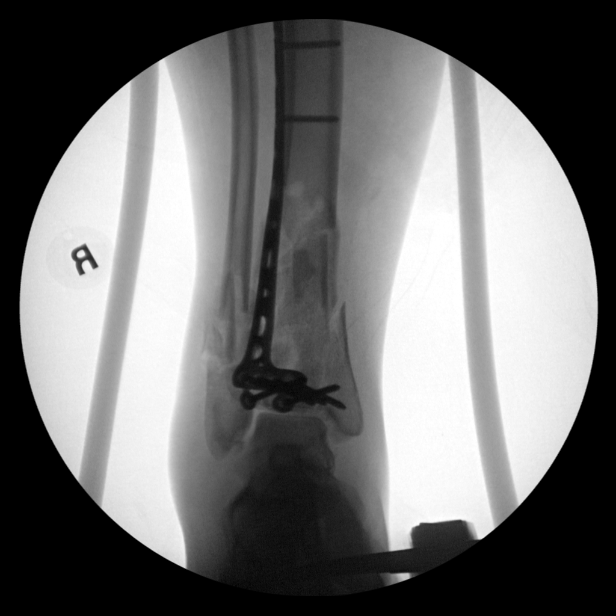
[im 10/13]
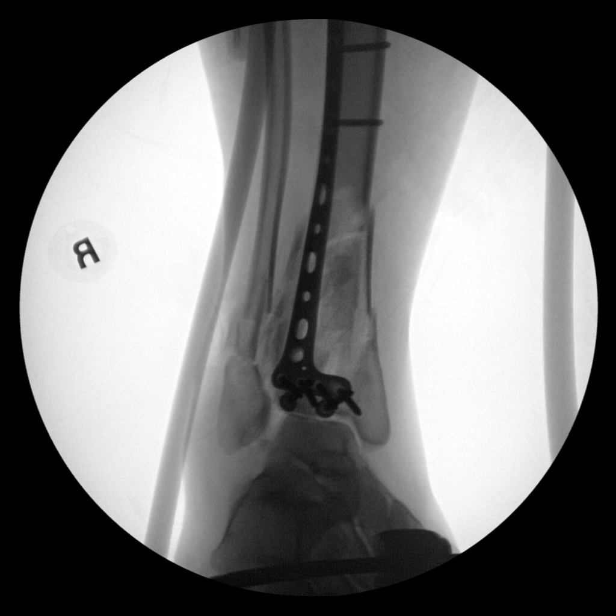
[im 11/13]
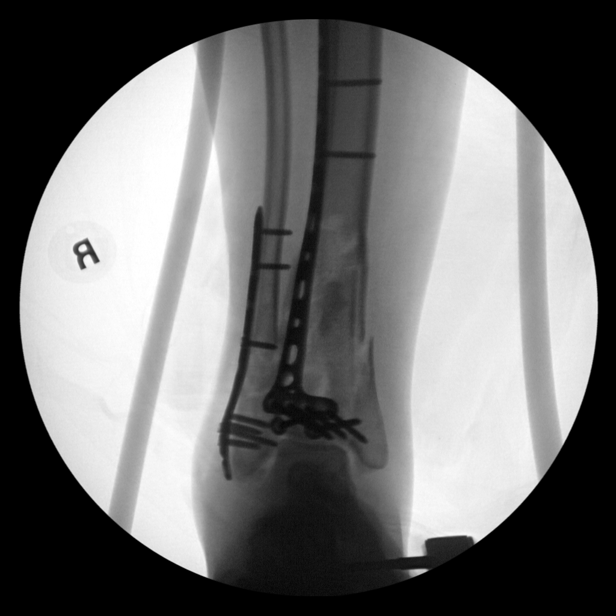
[im 12/13]
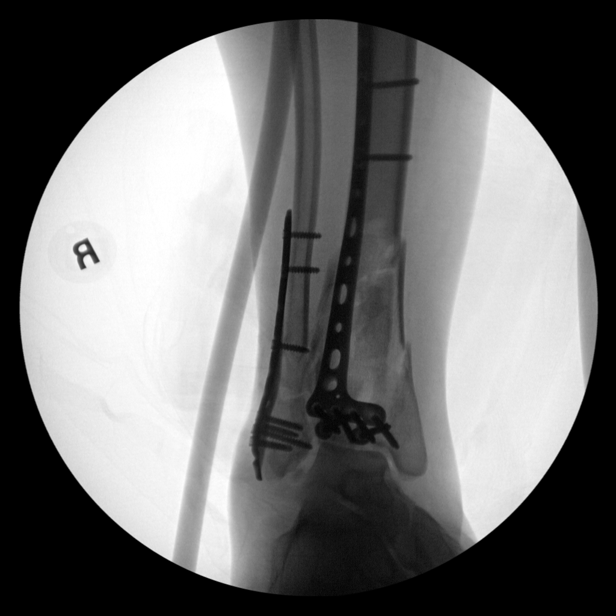
[im 13/13]
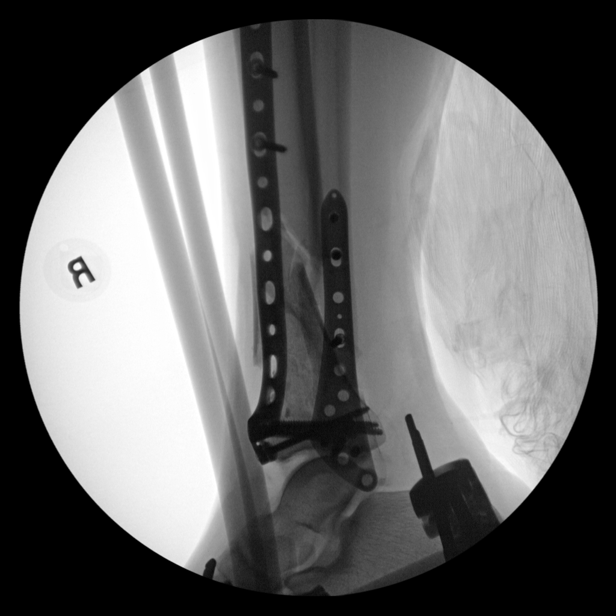

[13 of 13 positions shown; findings below may reference images not displayed]

FINDINGS: Fluoroscopic images show internal fixation of severely comminuted
fractures in the distal right tibia and fibula with metallic
sideplate and multiple screws. External immobilization screws are
noted in the tarsals. Fluoroscopic time was 259 seconds. Radiation
dose is 7.83 mGy.
IMPRESSION: Fluoroscopic assistance was provided for internal fixation of
severely comminuted fractures of distal right tibia and fibula.

## 2022-06-08 ENCOUNTER — Ambulatory Visit: Payer: Medicaid Other | Attending: Student | Admitting: Physical Therapy

## 2022-06-08 ENCOUNTER — Other Ambulatory Visit: Payer: Self-pay

## 2022-06-08 ENCOUNTER — Encounter: Payer: Self-pay | Admitting: Physical Therapy

## 2022-06-08 DIAGNOSIS — M6281 Muscle weakness (generalized): Secondary | ICD-10-CM | POA: Insufficient documentation

## 2022-06-08 DIAGNOSIS — M25671 Stiffness of right ankle, not elsewhere classified: Secondary | ICD-10-CM

## 2022-06-08 DIAGNOSIS — R262 Difficulty in walking, not elsewhere classified: Secondary | ICD-10-CM

## 2022-06-08 DIAGNOSIS — R2689 Other abnormalities of gait and mobility: Secondary | ICD-10-CM | POA: Insufficient documentation

## 2022-06-08 DIAGNOSIS — R600 Localized edema: Secondary | ICD-10-CM | POA: Insufficient documentation

## 2022-06-08 NOTE — Therapy (Signed)
OUTPATIENT PHYSICAL THERAPY LOWER EXTREMITY EVALUATION   Patient Name: Gina Martin MRN: [id number] DOB:1990/10/17, 32 y.o., female Today's Date: 06/08/2022  END OF SESSION:  PT End of Session - 06/08/22 1146     Visit Number 1    Number of Visits 4    Date for PT Re-Evaluation 07/20/22    Authorization Type Medicaid    Authorization - Number of Visits 3    Progress Note Due on Visit 3    PT Start Time 0940    PT Stop Time 1015    PT Time Calculation (min) 35 min    Activity Tolerance Patient tolerated treatment well    Behavior During Therapy WFL for tasks assessed/performed             Past Medical History:  Diagnosis Date   Anemia    Anxiety    Arthritis    Asthma    Bipolar 1 disorder ([place])    COVID 2022   mild   COVID 08/2020   states she was very sick, not hospitalized   GERD (gastroesophageal reflux disease)    Hyperlipidemia    PONV (postoperative nausea and vomiting)    Preeclampsia    Schizophrenia ([place])    Sleep apnea    does not use a cpap any longer   Past Surgical History:  Procedure Laterality Date   CESAREAN SECTION     EXTERNAL FIXATION LEG Right 05/07/2021   Procedure: EXTERNAL FIXATION AND IRRIGATION AND DEBRIDMENT  PILON;  Surgeon: Shona Needles, MD;  Location: [place];  Service: Orthopedics;  Laterality: Right;   EXTERNAL FIXATION REMOVAL Right 05/10/2021   Procedure: REMOVAL EXTERNAL FIXATION LEG;  Surgeon: Shona Needles, MD;  Location: [place];  Service: Orthopedics;  Laterality: Right;   HARDWARE REMOVAL Right 12/15/2021   Procedure: HARDWARE REMOVAL RIGHT TIBIA;  Surgeon: Shona Needles, MD;  Location: [place];  Service: Orthopedics;  Laterality: Right;   HARDWARE REMOVAL Right 03/18/2022   Procedure: HARDWARE REMOVAL RIGHT ANKLE;  Surgeon: Shona Needles, MD;  Location: [place];  Service: Orthopedics;  Laterality: Right;   OPEN REDUCTION INTERNAL FIXATION (ORIF) TIBIA/FIBULA FRACTURE Right 05/10/2021   Procedure: OPEN REDUCTION INTERNAL  FIXATION (ORIF) TIBIA/FIBULA FRACTURE;  Surgeon: Shona Needles, MD;  Location: [place];  Service: Orthopedics;  Laterality: Right;   OPEN REDUCTION INTERNAL FIXATION (ORIF) TIBIA/FIBULA FRACTURE Right 12/15/2021   Procedure: 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;  Surgeon: Shona Needles, MD;  Location: [place];  Service: Orthopedics;  Laterality: Right;   ORIF PATELLA Left 05/07/2021   Procedure: OPEN REDUCTION INTERNAL (ORIF) FIXATION PATELLA;  Surgeon: Shona Needles, MD;  Location: [place];  Service: Orthopedics;  Laterality: Left;   TUBAL LIGATION     Patient Active Problem List   Diagnosis Date Noted   Left patella fracture 05/10/2021   Closed fracture of lateral portion of right tibial plateau 05/10/2021   Open pilon fracture, right, type III, initial encounter 05/07/2021    PCP: [facility]  REFERRING PROVIDER: Katha Hamming  REFERRING DIAG: Leg weakness  THERAPY DIAG:  Muscle weakness (generalized)  Difficulty in walking, not elsewhere classified  Stiffness of right ankle, not elsewhere classified  Rationale for Evaluation and Treatment: Rehabilitation  ONSET DATE: 03/2022  SUBJECTIVE:   SUBJECTIVE STATEMENT: Pt had an ankle surgery to remove screws 03/2022. Since then she has been doing physical therapy at another location to improve ankle ROM and hip  strength. She feels she needs to continue PT to improve LE strength to return to work and play with her young children. She has some increased pain with prolonged standing and walking, still has some tenderness to touch Rt ankle. Pain eases with rest and estim.  PERTINENT HISTORY: Multiple Rt ankle surgeries PAIN:  Are you having pain? Yes: NPRS scale: 1/10 Pain location: Rt ankle Pain description: sore Aggravating factors: prolonged standing and walking Relieving factors: rest, estim  PRECAUTIONS: None  WEIGHT BEARING RESTRICTIONS: No  FALLS:  Has patient fallen in  last 6 months? 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  PLOF: Independent  PATIENT GOALS: be able to play with her kids and return to work  NEXT MD VISIT: March 2024  OBJECTIVE:    PATIENT SURVEYS:  FOTO 49   EDEMA:  Significant edema Rt ankle    PALPATION: TTP Rt lateral ankle  LOWER EXTREMITY ROM:  Active ROM Right eval Left eval  Hip flexion    Hip extension    Hip abduction    Hip adduction    Hip internal rotation    Hip external rotation    Knee flexion    Knee extension    Ankle dorsiflexion 0   Ankle plantarflexion 45   Ankle inversion 28   Ankle eversion 12    (Blank rows = not tested)  LOWER EXTREMITY MMT:  MMT Right eval Left eval  Hip flexion 4 4+  Hip extension 4- 4-  Hip abduction 4- 4  Hip adduction    Hip internal rotation    Hip external rotation    Knee flexion 4- 4  Knee extension    Ankle dorsiflexion 4-   Ankle plantarflexion 4- 4  Ankle inversion 3   Ankle eversion 3+    (Blank rows = not tested)   FUNCTIONAL TESTS:  SLS: LT >15 seconds, Rt unable 5x STS: 27.53 sec  GAIT: Distance walked: 100' Assistive device utilized: None Level of assistance: Complete Independence Comments: antalgic gait with decreased stance on Rt, decreased Rt ankle DF and PF during gait   TODAY'S TREATMENT:                                                                                                                              DATE: 06/08/22 See HEP    PATIENT EDUCATION:  Education details: PT POC and goals, HEP Person educated: Patient Education method: Explanation, Demonstration, and Handouts Education comprehension: verbalized understanding and returned demonstration  HOME EXERCISE PROGRAM: Pt has HEP for ankle 4 way with theraband and supine/sidelying hip strengthening: continue Access Code: [email or web address] URL: [email or web address] Date: 06/08/2022 Prepared by: Isabelle Course  Exercises - Side  Stepping with Resistance at Feet  - 1 x daily - 7 x weekly - 3 sets - 10 reps - Standing Single Leg Stance with Counter Support  - 1 x daily - 7 x weekly - 1 sets - 10  reps - 10-15 seconds hold - Sit to Stand Without Arm Support  - 1 x daily - 7 x weekly - 2 sets - 10 reps  ASSESSMENT:  CLINICAL IMPRESSION: Patient is a 32 y.o. female who was seen today for physical therapy evaluation and treatment for leg weakness. She presents with decreased strength and ROM, decreased standing and walking tolerance, impaired gait and balance and will benefit from skilled PT to address deficits and improve functional mobility.   OBJECTIVE IMPAIRMENTS: Abnormal gait, decreased balance, difficulty walking, decreased ROM, decreased strength, and pain.   ACTIVITY LIMITATIONS: standing and locomotion level  PARTICIPATION LIMITATIONS: cleaning, laundry, driving, shopping, community activity, and occupation  PERSONAL FACTORS: Time since onset of injury/illness/exacerbation are also affecting patient's functional outcome.   REHAB POTENTIAL: Good  CLINICAL DECISION MAKING: Evolving/moderate complexity  EVALUATION COMPLEXITY: Moderate   GOALS: Goals reviewed with patient? Yes  SHORT TERM GOALS: Target date: 06/22/2022   Pt will be independent in initial HEP Baseline: HEP not initiated Goal status: INITIAL   LONG TERM GOALS: Target date: 07/20/2022    Pt will be independent with advanced HEP Baseline: HEP not initiated Goal status: INITIAL  2.  Pt will improve FOTO to >= 64 to demo improved functional mobility Baseline: FOTO 49 Goal status: INITIAL  3.  Pt will improve bilat LE strength to 4+/5 to improve standing and walking tolerance Baseline: 4-/5 Goal status: INITIAL  4.  Pt will tolerate walking x 20 minutes with improved gait pattern with pain <= 1/10 Baseline: walking limited to 5 minutes Goal status: INITIAL  5.  Pt will improve SLS on Rt to >= 10 seconds to demo improved  balance Baseline: unable to tolerate SLS on Rt Goal status: INITIAL    PLAN:  PT FREQUENCY: 1-2x/week  PT DURATION: 6 weeks  PLANNED INTERVENTIONS: Therapeutic exercises, Therapeutic activity, Neuromuscular re-education, Balance training, Gait training, Patient/Family education, Self Care, Joint mobilization, Stair training, Aquatic Therapy, Dry Needling, Electrical stimulation, Taping, Manual therapy, and Re-evaluation  PLAN FOR NEXT SESSION: Ankle and LE strength, balance, endurance   Marshon Bangs, PT 06/08/2022, 11:47 AM

## 2022-06-16 ENCOUNTER — Encounter: Payer: Self-pay | Admitting: Physical Therapy

## 2022-06-16 ENCOUNTER — Ambulatory Visit: Payer: Medicaid Other | Attending: Student | Admitting: Physical Therapy

## 2022-06-16 DIAGNOSIS — R600 Localized edema: Secondary | ICD-10-CM

## 2022-06-16 DIAGNOSIS — M6281 Muscle weakness (generalized): Secondary | ICD-10-CM | POA: Diagnosis present

## 2022-06-16 DIAGNOSIS — R262 Difficulty in walking, not elsewhere classified: Secondary | ICD-10-CM | POA: Diagnosis present

## 2022-06-16 DIAGNOSIS — R2689 Other abnormalities of gait and mobility: Secondary | ICD-10-CM | POA: Diagnosis present

## 2022-06-16 DIAGNOSIS — M25671 Stiffness of right ankle, not elsewhere classified: Secondary | ICD-10-CM

## 2022-06-16 NOTE — Therapy (Signed)
OUTPATIENT PHYSICAL THERAPY TREATMENT   Patient Name: Gina Martin MRN: [id number] DOB:1990-06-02, 32 y.o., female Today's Date: 06/16/2022  END OF SESSION:  PT End of Session - 06/16/22 1101     Visit Number 2    Number of Visits 4    Date for PT Re-Evaluation 07/20/22    Authorization Type Medicaid    Authorization - Visit Number 1    Authorization - Number of Visits 3    Progress Note Due on Visit 3    PT Start Time 1101    PT Stop Time 1145    PT Time Calculation (min) 44 min    Activity Tolerance Patient tolerated treatment well    Behavior During Therapy WFL for tasks assessed/performed             Past Medical History:  Diagnosis Date   Anemia    Anxiety    Arthritis    Asthma    Bipolar 1 disorder ([place])    COVID 2022   mild   COVID 08/2020   states she was very sick, not hospitalized   GERD (gastroesophageal reflux disease)    Hyperlipidemia    PONV (postoperative nausea and vomiting)    Preeclampsia    Schizophrenia ([place])    Sleep apnea    does not use a cpap any longer   Past Surgical History:  Procedure Laterality Date   CESAREAN SECTION     EXTERNAL FIXATION LEG Right 05/07/2021   Procedure: EXTERNAL FIXATION AND IRRIGATION AND DEBRIDMENT  PILON;  Surgeon: Shona Needles, MD;  Location: [place];  Service: Orthopedics;  Laterality: Right;   EXTERNAL FIXATION REMOVAL Right 05/10/2021   Procedure: REMOVAL EXTERNAL FIXATION LEG;  Surgeon: Shona Needles, MD;  Location: [place];  Service: Orthopedics;  Laterality: Right;   HARDWARE REMOVAL Right 12/15/2021   Procedure: HARDWARE REMOVAL RIGHT TIBIA;  Surgeon: Shona Needles, MD;  Location: [place];  Service: Orthopedics;  Laterality: Right;   HARDWARE REMOVAL Right 03/18/2022   Procedure: HARDWARE REMOVAL RIGHT ANKLE;  Surgeon: Shona Needles, MD;  Location: [place];  Service: Orthopedics;  Laterality: Right;   OPEN REDUCTION INTERNAL FIXATION (ORIF) TIBIA/FIBULA FRACTURE Right 05/10/2021   Procedure: OPEN  REDUCTION INTERNAL FIXATION (ORIF) TIBIA/FIBULA FRACTURE;  Surgeon: Shona Needles, MD;  Location: [place];  Service: Orthopedics;  Laterality: Right;   OPEN REDUCTION INTERNAL FIXATION (ORIF) TIBIA/FIBULA FRACTURE Right 12/15/2021   Procedure: 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;  Surgeon: Shona Needles, MD;  Location: [place];  Service: Orthopedics;  Laterality: Right;   ORIF PATELLA Left 05/07/2021   Procedure: OPEN REDUCTION INTERNAL (ORIF) FIXATION PATELLA;  Surgeon: Shona Needles, MD;  Location: [place];  Service: Orthopedics;  Laterality: Left;   TUBAL LIGATION     Patient Active Problem List   Diagnosis Date Noted   Left patella fracture 05/10/2021   Closed fracture of lateral portion of right tibial plateau 05/10/2021   Open pilon fracture, right, type III, initial encounter 05/07/2021    PCP: [facility]  REFERRING PROVIDER: Katha Hamming  REFERRING DIAG: Leg weakness  THERAPY DIAG:  Muscle weakness (generalized)  Difficulty in walking, not elsewhere classified  Stiffness of right ankle, not elsewhere classified  Other abnormalities of gait and mobility  Localized edema  Rationale for Evaluation and Treatment: Rehabilitation  ONSET DATE: 03/2022  SUBJECTIVE:   SUBJECTIVE STATEMENT: Pt states R ankle remains swollen. Just started back in  therapy in January.   PERTINENT HISTORY: Multiple Rt ankle surgeries From eval: Pt had an ankle surgery to remove screws 03/2022. Since then she has been doing physical therapy at another location to improve ankle ROM and hip strength. She feels she needs to continue PT to improve LE strength to return to work and play with her young children. She has some increased pain with prolonged standing and walking, still has some tenderness to touch Rt ankle. Pain eases with rest and estim.  PAIN:  Are you having pain? Yes: NPRS scale: 1/10 Pain location: Rt ankle Pain description:  sore Aggravating factors: prolonged standing and walking Relieving factors: rest, estim  PRECAUTIONS: None  WEIGHT BEARING RESTRICTIONS: No  FALLS:  Has patient fallen in last 6 months? 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  PLOF: Independent  PATIENT GOALS: be able to play with her kids and return to work  NEXT MD VISIT: March 2024  OBJECTIVE:    PATIENT SURVEYS:  FOTO 49  EDEMA:  Significant edema Rt ankle  PALPATION: TTP Rt lateral ankle  LOWER EXTREMITY ROM:  Active ROM Right eval Left eval  Hip flexion    Hip extension    Hip abduction    Hip adduction    Hip internal rotation    Hip external rotation    Knee flexion    Knee extension    Ankle dorsiflexion 0   Ankle plantarflexion 45   Ankle inversion 28   Ankle eversion 12    (Blank rows = not tested)  LOWER EXTREMITY MMT:  MMT Right eval Left eval  Hip flexion 4 4+  Hip extension 4- 4-  Hip abduction 4- 4  Hip adduction    Hip internal rotation    Hip external rotation    Knee flexion 4- 4  Knee extension    Ankle dorsiflexion 4-   Ankle plantarflexion 4- 4  Ankle inversion 3   Ankle eversion 3+    (Blank rows = not tested)   FUNCTIONAL TESTS:  SLS: LT >15 seconds, Rt unable 5x STS: 27.53 sec  GAIT: Distance walked: 100' Assistive device utilized: None Level of assistance: Complete Independence Comments: antalgic gait with decreased stance on Rt, decreased Rt ankle DF and PF during gait   TODAY'S TREATMENT:                                                                                                                              [facility] Adult PT Treatment:                                                DATE: 06/16/22 Therapeutic Exercise: Nustep L6 x 5 min Sitting BAPS lvl 4 forward/backward, inv/ev 2x10 Standing Heel raise in tandem stance 2x10 with eccentric drop Toe raise  in tandem stance 2x10 Woodpecker 2x10 Side step red TB 2x10 Marching red TB  2x10   DATE: 06/08/22 See HEP    PATIENT EDUCATION:  Education details: PT POC and goals, HEP Person educated: Patient Education method: Explanation, Demonstration, and Handouts Education comprehension: verbalized understanding and returned demonstration  HOME EXERCISE PROGRAM: Pt has HEP for ankle 4 way with theraband and supine/sidelying hip strengthening: continue Access Code: [email or web address] URL: [email or web address] Date: 06/08/2022 Prepared by: Isabelle Course  Exercises - Side Stepping with Resistance at Feet  - 1 x daily - 7 x weekly - 3 sets - 10 reps - Standing Single Leg Stance with Counter Support  - 1 x daily - 7 x weekly - 1 sets - 10 reps - 10-15 seconds hold - Sit to Stand Without Arm Support  - 1 x daily - 7 x weekly - 2 sets - 10 reps  ASSESSMENT:  CLINICAL IMPRESSION: Treatment focused on improving strength, ROM, and stability.   OBJECTIVE IMPAIRMENTS: Abnormal gait, decreased balance, difficulty walking, decreased ROM, decreased strength, and pain.     GOALS: Goals reviewed with patient? Yes  SHORT TERM GOALS: Target date: 06/22/2022   Pt will be independent in initial HEP Baseline: HEP not initiated Goal status: INITIAL   LONG TERM GOALS: Target date: 07/20/2022    Pt will be independent with advanced HEP Baseline: HEP not initiated Goal status: INITIAL  2.  Pt will improve FOTO to >= 64 to demo improved functional mobility Baseline: FOTO 49 Goal status: INITIAL  3.  Pt will improve bilat LE strength to 4+/5 to improve standing and walking tolerance Baseline: 4-/5 Goal status: INITIAL  4.  Pt will tolerate walking x 20 minutes with improved gait pattern with pain <= 1/10 Baseline: walking limited to 5 minutes Goal status: INITIAL  5.  Pt will improve SLS on Rt to >= 10 seconds to demo improved balance Baseline: unable to tolerate SLS on Rt Goal status: INITIAL    PLAN:  PT FREQUENCY: 1-2x/week  PT DURATION: 6  weeks  PLANNED INTERVENTIONS: Therapeutic exercises, Therapeutic activity, Neuromuscular re-education, Balance training, Gait training, Patient/Family education, Self Care, Joint mobilization, Stair training, Aquatic Therapy, Dry Needling, Electrical stimulation, Taping, Manual therapy, and Re-evaluation  PLAN FOR NEXT SESSION: Ankle and LE strength, balance, endurance   Tyliah Schlereth April Ma L Aydon Swamy, PT 06/16/2022, 11:01 AM

## 2022-06-23 ENCOUNTER — Ambulatory Visit: Payer: Medicaid Other | Admitting: Physical Therapy

## 2022-06-23 DIAGNOSIS — M6281 Muscle weakness (generalized): Secondary | ICD-10-CM

## 2022-06-23 DIAGNOSIS — R2689 Other abnormalities of gait and mobility: Secondary | ICD-10-CM

## 2022-06-23 DIAGNOSIS — R262 Difficulty in walking, not elsewhere classified: Secondary | ICD-10-CM

## 2022-06-23 DIAGNOSIS — M25671 Stiffness of right ankle, not elsewhere classified: Secondary | ICD-10-CM

## 2022-06-23 NOTE — Therapy (Signed)
OUTPATIENT PHYSICAL THERAPY TREATMENT   Patient Name: Gina Martin MRN: [id number] DOB:May 18, 1990, 32 y.o., female Today's Date: 06/23/2022  END OF SESSION:    Past Medical History:  Diagnosis Date   Anemia    Anxiety    Arthritis    Asthma    Bipolar 1 disorder ([place])    COVID 2022   mild   COVID 08/2020   states she was very sick, not hospitalized   GERD (gastroesophageal reflux disease)    Hyperlipidemia    PONV (postoperative nausea and vomiting)    Preeclampsia    Schizophrenia ([place])    Sleep apnea    does not use a cpap any longer   Past Surgical History:  Procedure Laterality Date   CESAREAN SECTION     EXTERNAL FIXATION LEG Right 05/07/2021   Procedure: EXTERNAL FIXATION AND IRRIGATION AND DEBRIDMENT  PILON;  Surgeon: Shona Needles, MD;  Location: [facility];  Service: Orthopedics;  Laterality: Right;   EXTERNAL FIXATION REMOVAL Right 05/10/2021   Procedure: REMOVAL EXTERNAL FIXATION LEG;  Surgeon: Shona Needles, MD;  Location: [place];  Service: Orthopedics;  Laterality: Right;   HARDWARE REMOVAL Right 12/15/2021   Procedure: HARDWARE REMOVAL RIGHT TIBIA;  Surgeon: Shona Needles, MD;  Location: [place];  Service: Orthopedics;  Laterality: Right;   HARDWARE REMOVAL Right 03/18/2022   Procedure: HARDWARE REMOVAL RIGHT ANKLE;  Surgeon: Shona Needles, MD;  Location: [place];  Service: Orthopedics;  Laterality: Right;   OPEN REDUCTION INTERNAL FIXATION (ORIF) TIBIA/FIBULA FRACTURE Right 05/10/2021   Procedure: OPEN REDUCTION INTERNAL FIXATION (ORIF) TIBIA/FIBULA FRACTURE;  Surgeon: Shona Needles, MD;  Location: [place];  Service: Orthopedics;  Laterality: Right;   OPEN REDUCTION INTERNAL FIXATION (ORIF) TIBIA/FIBULA FRACTURE Right 12/15/2021   Procedure: 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;  Surgeon: Shona Needles, MD;  Location: [place];  Service: Orthopedics;  Laterality: Right;   ORIF PATELLA Left 05/07/2021   Procedure: OPEN REDUCTION INTERNAL  (ORIF) FIXATION PATELLA;  Surgeon: Shona Needles, MD;  Location: [place];  Service: Orthopedics;  Laterality: Left;   TUBAL LIGATION     Patient Active Problem List   Diagnosis Date Noted   Left patella fracture 05/10/2021   Closed fracture of lateral portion of right tibial plateau 05/10/2021   Open pilon fracture, right, type III, initial encounter 05/07/2021    PCP: [facility]  REFERRING PROVIDER: Katha Hamming  REFERRING DIAG: Leg weakness  THERAPY DIAG:  No diagnosis found.  Rationale for Evaluation and Treatment: Rehabilitation  ONSET DATE: 03/2022  SUBJECTIVE:   SUBJECTIVE STATEMENT: Pt states she has been able to walk half the block in her neighborhood but was very tired.   PERTINENT HISTORY: Multiple Rt ankle surgeries From eval: Pt had an ankle surgery to remove screws 03/2022. Since then she has been doing physical therapy at another location to improve ankle ROM and hip strength. She feels she needs to continue PT to improve LE strength to return to work and play with her young children. She has some increased pain with prolonged standing and walking, still has some tenderness to touch Rt ankle. Pain eases with rest and estim.  PAIN:  Are you having pain? Yes: NPRS scale: 1/10 Pain location: Rt ankle Pain description: sore Aggravating factors: prolonged standing and walking Relieving factors: rest, estim  PRECAUTIONS: None  WEIGHT BEARING RESTRICTIONS: No  FALLS:  Has patient fallen in last 6 months? 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  PLOF: Independent  PATIENT GOALS: be able to play with her kids and return to work  NEXT MD VISIT: March 2024  OBJECTIVE:    PATIENT SURVEYS:  FOTO 49  EDEMA:  Significant edema Rt ankle  PALPATION: TTP Rt lateral ankle  LOWER EXTREMITY ROM:  Active ROM Right eval Left eval  Hip flexion    Hip extension    Hip abduction    Hip adduction    Hip  internal rotation    Hip external rotation    Knee flexion    Knee extension    Ankle dorsiflexion 0   Ankle plantarflexion 45   Ankle inversion 28   Ankle eversion 12    (Blank rows = not tested)  LOWER EXTREMITY MMT:  MMT Right eval Left eval  Hip flexion 4 4+  Hip extension 4- 4-  Hip abduction 4- 4  Hip adduction    Hip internal rotation    Hip external rotation    Knee flexion 4- 4  Knee extension    Ankle dorsiflexion 4-   Ankle plantarflexion 4- 4  Ankle inversion 3   Ankle eversion 3+    (Blank rows = not tested)   FUNCTIONAL TESTS:  SLS: LT >15 seconds, Rt unable 5x STS: 27.53 sec  GAIT: Distance walked: 100' Assistive device utilized: None Level of assistance: Complete Independence Comments: antalgic gait with decreased stance on Rt, decreased Rt ankle DF and PF during gait   TODAY'S TREATMENT:                                                                                                                              [facility] Adult PT Treatment:                                                DATE: 06/23/22 Therapeutic Exercise: Rockerboard fwd/bwd DL 2x10, R SL 2x10 Rockerboard static fwd/bwd 2x30 sec Rockerboard laterally inv x10, ev x10 R only Rockerboard static laterally 2x30 sec [place] 10x10 sec Gait: Standing heel strike with ankle ER green TB 2x10 Amb with focus on R glute set during stance to reduce trendeleburg pattern    [facility] Adult PT Treatment:                                                DATE: 06/16/22 Therapeutic Exercise: Nustep L6 x 5 min Sitting BAPS lvl 4 forward/backward, inv/ev 2x10 Standing Heel raise in tandem stance 2x10 with eccentric drop Toe raise in tandem stance 2x10 Woodpecker 2x10 Side step red TB 2x10 Marching red TB 2x10   DATE: 06/08/22 See HEP  PATIENT EDUCATION:  Education details: PT POC and goals, HEP Person educated: Patient Education method: Consulting civil engineer, Demonstration, and Handouts Education  comprehension: verbalized understanding and returned demonstration  HOME EXERCISE PROGRAM: Pt has HEP for ankle 4 way with theraband and supine/sidelying hip strengthening: continue Access Code: [email or web address] URL: [email or web address] Date: 06/08/2022 Prepared by: Isabelle Course  Exercises - Side Stepping with Resistance at Feet  - 1 x daily - 7 x weekly - 3 sets - 10 reps - Standing Single Leg Stance with Counter Support  - 1 x daily - 7 x weekly - 1 sets - 10 reps - 10-15 seconds hold - Sit to Stand Without Arm Support  - 1 x daily - 7 x weekly - 2 sets - 10 reps  ASSESSMENT:  CLINICAL IMPRESSION: Continued to work on R ankle strengthening in standing. Working on continued stability, balance, and gait. Improving R ankle PF.   OBJECTIVE IMPAIRMENTS: Abnormal gait, decreased balance, difficulty walking, decreased ROM, decreased strength, and pain.     GOALS: Goals reviewed with patient? Yes  SHORT TERM GOALS: Target date: 06/22/2022   Pt will be independent in initial HEP Baseline: HEP not initiated Goal status: INITIAL   LONG TERM GOALS: Target date: 07/20/2022    Pt will be independent with advanced HEP Baseline: HEP not initiated Goal status: INITIAL  2.  Pt will improve FOTO to >= 64 to demo improved functional mobility Baseline: FOTO 49 Goal status: INITIAL  3.  Pt will improve bilat LE strength to 4+/5 to improve standing and walking tolerance Baseline: 4-/5 Goal status: INITIAL  4.  Pt will tolerate walking x 20 minutes with improved gait pattern with pain <= 1/10 Baseline: walking limited to 5 minutes Goal status: INITIAL  5.  Pt will improve SLS on Rt to >= 10 seconds to demo improved balance Baseline: unable to tolerate SLS on Rt Goal status: INITIAL    PLAN:  PT FREQUENCY: 1-2x/week  PT DURATION: 6 weeks  PLANNED INTERVENTIONS: Therapeutic exercises, Therapeutic activity, Neuromuscular re-education, Balance training, Gait training,  Patient/Family education, Self Care, Joint mobilization, Stair training, Aquatic Therapy, Dry Needling, Electrical stimulation, Taping, Manual therapy, and Re-evaluation  PLAN FOR NEXT SESSION: Ankle and LE strength, balance, endurance   Quaniyah Bugh April Ma L Jhalen Eley, PT 06/23/2022, 7:53 AM

## 2022-06-30 ENCOUNTER — Ambulatory Visit: Payer: Medicaid Other | Admitting: Physical Therapy

## 2022-06-30 ENCOUNTER — Encounter: Payer: Self-pay | Admitting: Physical Therapy

## 2022-06-30 DIAGNOSIS — M6281 Muscle weakness (generalized): Secondary | ICD-10-CM | POA: Diagnosis not present

## 2022-06-30 DIAGNOSIS — M25671 Stiffness of right ankle, not elsewhere classified: Secondary | ICD-10-CM

## 2022-06-30 DIAGNOSIS — R262 Difficulty in walking, not elsewhere classified: Secondary | ICD-10-CM

## 2022-06-30 DIAGNOSIS — R2689 Other abnormalities of gait and mobility: Secondary | ICD-10-CM

## 2022-06-30 DIAGNOSIS — R600 Localized edema: Secondary | ICD-10-CM

## 2022-06-30 NOTE — Therapy (Addendum)
OUTPATIENT PHYSICAL THERAPY TREATMENT AND DISCHARGE  PHYSICAL THERAPY DISCHARGE SUMMARY  Visits from Start of Care: 4  Current functional level related to goals / functional outcomes: See below   Remaining deficits: See below   Education / Equipment: See below   Patient agrees to discharge. Patient goals were not met. Patient is being discharged due to not returning since the last visit.    Patient Name: Gina Martin MRN: [id number] DOB:06-Nov-1990, 32 y.o., female Today's Date: 06/30/2022  END OF SESSION:  PT End of Session - 06/30/22 0851     Visit Number 4    Number of Visits 4    Date for PT Re-Evaluation 07/20/22    Authorization Type Medicaid    Authorization - Number of Visits 3    Progress Note Due on Visit 3    PT Start Time [phone]    PT Stop Time 0930    PT Time Calculation (min) 39 min    Activity Tolerance Patient tolerated treatment well    Behavior During Therapy [facility] for tasks assessed/performed              Past Medical History:  Diagnosis Date   Anemia    Anxiety    Arthritis    Asthma    Bipolar 1 disorder ([facility])    COVID 2022   mild   COVID 08/2020   states she was very sick, not hospitalized   GERD (gastroesophageal reflux disease)    Hyperlipidemia    PONV (postoperative nausea and vomiting)    Preeclampsia    Schizophrenia ([facility])    Sleep apnea    does not use a cpap any longer   Past Surgical History:  Procedure Laterality Date   CESAREAN SECTION     EXTERNAL FIXATION LEG Right 05/07/2021   Procedure: EXTERNAL FIXATION AND IRRIGATION AND DEBRIDMENT  PILON;  Surgeon: Roby Lofts, MD;  [facility];  Service: Orthopedics;  Laterality: Right;   EXTERNAL FIXATION REMOVAL Right 05/10/2021   Procedure: REMOVAL EXTERNAL FIXATION LEG;  Surgeon: Roby Lofts, MD;  [facility];  Service: Orthopedics;  Laterality: Right;   HARDWARE REMOVAL Right 12/15/2021   Procedure: HARDWARE REMOVAL RIGHT TIBIA;  Surgeon: Roby Lofts,  MD;  [facility];  Service: Orthopedics;  Laterality: Right;   HARDWARE REMOVAL Right 03/18/2022   Procedure: HARDWARE REMOVAL RIGHT ANKLE;  Surgeon: Roby Lofts, MD;  [facility];  Service: Orthopedics;  Laterality: Right;   OPEN REDUCTION INTERNAL FIXATION (ORIF) TIBIA/FIBULA FRACTURE Right 05/10/2021   Procedure: OPEN REDUCTION INTERNAL FIXATION (ORIF) TIBIA/FIBULA FRACTURE;  Surgeon: Roby Lofts, MD;  [facility];  Service: Orthopedics;  Laterality: Right;   OPEN REDUCTION INTERNAL FIXATION (ORIF) TIBIA/FIBULA FRACTURE Right 12/15/2021   Procedure: 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;  Surgeon: Roby Lofts, MD;  [facility];  Service: Orthopedics;  Laterality: Right;   ORIF PATELLA Left 05/07/2021   Procedure: OPEN REDUCTION INTERNAL (ORIF) FIXATION PATELLA;  Surgeon: Roby Lofts, MD;  [facility];  Service: Orthopedics;  Laterality: Left;   TUBAL LIGATION     Patient Active Problem List   Diagnosis Date Noted   Left patella fracture 05/10/2021   Closed fracture of lateral portion of right tibial plateau 05/10/2021   Open pilon fracture, right, type III, initial encounter 05/07/2021    PCP: [facility]  REFERRING PROVIDER: Truitt Merle  REFERRING DIAG: Leg weakness  THERAPY DIAG:  Muscle weakness (generalized)  Difficulty in walking, not elsewhere classified  Stiffness of right ankle, not elsewhere classified  Other abnormalities of gait and mobility  Localized edema  Rationale for Evaluation and Treatment: Rehabilitation  ONSET DATE: 03/2022  SUBJECTIVE:   SUBJECTIVE STATEMENT: Pt states she was very sore after last session but went away after a day.   PERTINENT HISTORY: Multiple Rt ankle surgeries From eval: Pt had an ankle surgery to remove screws 03/2022. Since then she has been doing physical therapy at another location to improve ankle ROM and hip strength. She feels she needs to  continue PT to improve LE strength to return to work and play with her young children. She has some increased pain with prolonged standing and walking, still has some tenderness to touch Rt ankle. Pain eases with rest and estim.  PAIN:  Are you having pain? Yes: NPRS scale: 1/10 Pain location: Rt ankle Pain description: sore Aggravating factors: prolonged standing and walking Relieving factors: rest, estim  PRECAUTIONS: None  WEIGHT BEARING RESTRICTIONS: No  FALLS:  Has patient fallen in last 6 months? No  OCCUPATION: currently out of work, worked Network engineer tshirts  PLOF: Independent  PATIENT GOALS: be able to play with her kids and return to work  NEXT MD VISIT: March 2024  OBJECTIVE:    PATIENT SURVEYS:  FOTO 49  EDEMA:  Significant edema Rt ankle  PALPATION: TTP Rt lateral ankle  LOWER EXTREMITY ROM:  Active ROM Right eval Left eval  Hip flexion    Hip extension    Hip abduction    Hip adduction    Hip internal rotation    Hip external rotation    Knee flexion    Knee extension    Ankle dorsiflexion 0   Ankle plantarflexion 45   Ankle inversion 28   Ankle eversion 12    (Blank rows = not tested)  LOWER EXTREMITY MMT:  MMT Right eval Left eval  Hip flexion 4 4+  Hip extension 4- 4-  Hip abduction 4- 4  Hip adduction    Hip internal rotation    Hip external rotation    Knee flexion 4- 4  Knee extension    Ankle dorsiflexion 4-   Ankle plantarflexion 4- 4  Ankle inversion 3   Ankle eversion 3+    (Blank rows = not tested)   FUNCTIONAL TESTS:  SLS: LT >15 seconds, Rt unable 5x STS: 27.53 sec  GAIT: Distance walked: 100' Assistive device utilized: None Level of assistance: Complete Independence Comments: antalgic gait with decreased stance on Rt, decreased Rt ankle DF and PF during gait   TODAY'S TREATMENT:   [facility] Adult PT Treatment:                                                DATE: 06/30/22 Therapeutic Exercise: Treadmill 1.5 mph  x 8 min [place] 10x10 sec R&L SLS with intermittent UE support 4x10 sec Eccentric heel raise 10x3 sec Gastroc stretch x30 sec Gait: Standing heel strike with ankle ER green TB 2x10 Amb with focus on R glute set during stance to reduce trendeleburg pattern  [facility] Adult PT Treatment:                                                DATE: 06/23/22 Therapeutic Exercise: Rockerboard fwd/bwd DL 0A54, R SL 0J81 Rockerboard static fwd/bwd 2x30 sec Rockerboard laterally inv x10, ev x10 R only Rockerboard static laterally 2x30 sec [place] 10x10 sec Gait: Standing heel strike with ankle ER green TB 2x10 Amb with focus on R glute set during stance to reduce trendeleburg pattern    [facility] Adult PT Treatment:                                                DATE: 06/16/22 Therapeutic Exercise: Nustep L6 x 5 min Sitting BAPS lvl 4 forward/backward, inv/ev 2x10 Standing Heel raise in tandem stance 2x10 with eccentric drop Toe raise in tandem stance 2x10 Woodpecker 2x10 Side step red TB 2x10 Marching red TB 2x10   PATIENT EDUCATION:  Education details: PT POC and goals, HEP Person educated: Patient Education method: Programmer, multimedia, Facilities manager, and Handouts Education comprehension: verbalized understanding and returned demonstration  HOME EXERCISE PROGRAM: Pt has HEP for ankle 4 way with theraband and supine/sidelying hip strengthening: continue Access Code: [email or web address] URL: [email or web address] Date: 06/08/2022 Prepared by: Reggy Eye  Exercises - Side Stepping with Resistance at Feet  - 1 x daily - 7 x weekly - 3 sets - 10 reps - Standing Single Leg Stance with Counter Support  - 1 x daily - 7 x weekly - 1 sets - 10 reps - 10-15 seconds hold - Sit to Stand Without Arm Support  - 1 x daily - 7 x weekly - 2 sets - 10  reps  ASSESSMENT:  CLINICAL IMPRESSION: Continued improvements noted with R single leg stability and calf strength. Able to tolerate ~8 sec of R SLS. Working on improving endurance -- able to tolerate 8 minutes on treadmill. Discussed with pt to try and increase her walking at home to at least 10 minutes twice a day if able. Pt would benefit from continued   OBJECTIVE IMPAIRMENTS: Abnormal gait, decreased balance, difficulty walking, decreased ROM, decreased strength, and pain.     GOALS: Goals reviewed with patient? Yes  SHORT TERM GOALS: Target date: 06/22/2022   Pt will be independent in initial HEP Baseline: HEP not initiated Goal status: INITIAL   LONG TERM GOALS: Target date: 07/20/2022    Pt will be independent with advanced HEP Baseline: HEP not initiated Goal status: INITIAL  2.  Pt will improve FOTO to >= 64 to demo improved functional mobility Baseline: FOTO 49 Goal status: INITIAL  3.  Pt will improve bilat LE strength to 4+/5 to improve standing and walking tolerance Baseline: 4-/5 Goal status: INITIAL  4.  Pt will tolerate walking x 20 minutes with improved gait pattern with pain <= 1/10 Baseline: walking limited to 5 minutes Goal status: INITIAL  5.  Pt will improve SLS on Rt to >= 10 seconds to demo improved balance Baseline: unable to tolerate SLS on Rt Goal status: INITIAL    PLAN:  PT FREQUENCY: 1-2x/week  PT DURATION: 6 weeks  PLANNED INTERVENTIONS: Therapeutic exercises, Therapeutic activity, Neuromuscular re-education, Balance training, Gait training, Patient/Family education, Self Care, Joint mobilization,  Stair training, Aquatic Therapy, Dry Needling, Electrical stimulation, Taping, Manual therapy, and Re-evaluation  PLAN FOR NEXT SESSION: Ankle and LE strength, balance, endurance   Rosia Syme April Ma L Rafiq Bucklin, PT 06/30/2022, 8:51 AM

## 2022-07-07 ENCOUNTER — Ambulatory Visit: Payer: Medicaid Other | Admitting: Physical Therapy

## 2022-07-07 ENCOUNTER — Encounter: Payer: Self-pay | Admitting: Physical Therapy

## 2022-07-07 DIAGNOSIS — M6281 Muscle weakness (generalized): Secondary | ICD-10-CM | POA: Insufficient documentation

## 2022-07-07 DIAGNOSIS — R262 Difficulty in walking, not elsewhere classified: Secondary | ICD-10-CM | POA: Insufficient documentation

## 2022-07-07 DIAGNOSIS — R600 Localized edema: Secondary | ICD-10-CM | POA: Insufficient documentation

## 2022-07-07 DIAGNOSIS — M25671 Stiffness of right ankle, not elsewhere classified: Secondary | ICD-10-CM | POA: Insufficient documentation

## 2022-07-07 DIAGNOSIS — R2689 Other abnormalities of gait and mobility: Secondary | ICD-10-CM | POA: Insufficient documentation

## 2022-07-07 NOTE — Therapy (Signed)
Arrived but no charge. Waiting on Medicaid authorization.

## 2022-07-14 ENCOUNTER — Ambulatory Visit: Payer: Medicaid Other | Admitting: Physical Therapy

## 2022-07-14 NOTE — Therapy (Deleted)
OUTPATIENT PHYSICAL THERAPY TREATMENT   Patient Name: Gina Martin MRN: [id number] DOB:10-13-1990, 32 y.o., female Today's Date: 07/14/2022  END OF SESSION:     Past Medical History:  Diagnosis Date   Anemia    Anxiety    Arthritis    Asthma    Bipolar 1 disorder ([place])    COVID 2022   mild   COVID 08/2020   states she was very sick, not hospitalized   GERD (gastroesophageal reflux disease)    Hyperlipidemia    PONV (postoperative nausea and vomiting)    Preeclampsia    Schizophrenia ([place])    Sleep apnea    does not use a cpap any longer   Past Surgical History:  Procedure Laterality Date   CESAREAN SECTION     EXTERNAL FIXATION LEG Right 05/07/2021   Procedure: EXTERNAL FIXATION AND IRRIGATION AND DEBRIDMENT  PILON;  Surgeon: Shona Needles, MD;  Location: [place];  Service: Orthopedics;  Laterality: Right;   EXTERNAL FIXATION REMOVAL Right 05/10/2021   Procedure: REMOVAL EXTERNAL FIXATION LEG;  Surgeon: Shona Needles, MD;  Location: [place];  Service: Orthopedics;  Laterality: Right;   HARDWARE REMOVAL Right 12/15/2021   Procedure: HARDWARE REMOVAL RIGHT TIBIA;  Surgeon: Shona Needles, MD;  Location: [place];  Service: Orthopedics;  Laterality: Right;   HARDWARE REMOVAL Right 03/18/2022   Procedure: HARDWARE REMOVAL RIGHT ANKLE;  Surgeon: Shona Needles, MD;  Location: [place];  Service: Orthopedics;  Laterality: Right;   OPEN REDUCTION INTERNAL FIXATION (ORIF) TIBIA/FIBULA FRACTURE Right 05/10/2021   Procedure: OPEN REDUCTION INTERNAL FIXATION (ORIF) TIBIA/FIBULA FRACTURE;  Surgeon: Shona Needles, MD;  Location: [place];  Service: Orthopedics;  Laterality: Right;   OPEN REDUCTION INTERNAL FIXATION (ORIF) TIBIA/FIBULA FRACTURE Right 12/15/2021   Procedure: 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;  Surgeon: Shona Needles, MD;  Location: [place];  Service: Orthopedics;  Laterality: Right;   ORIF PATELLA Left 05/07/2021   Procedure: OPEN REDUCTION INTERNAL  (ORIF) FIXATION PATELLA;  Surgeon: Shona Needles, MD;  Location: [place];  Service: Orthopedics;  Laterality: Left;   TUBAL LIGATION     Patient Active Problem List   Diagnosis Date Noted   Left patella fracture 05/10/2021   Closed fracture of lateral portion of right tibial plateau 05/10/2021   Open pilon fracture, right, type III, initial encounter 05/07/2021    PCP: [facility]  REFERRING PROVIDER: Katha Hamming  REFERRING DIAG: Leg weakness  THERAPY DIAG:  No diagnosis found.  Rationale for Evaluation and Treatment: Rehabilitation  ONSET DATE: 03/2022  SUBJECTIVE:   SUBJECTIVE STATEMENT: *** Pt states she was very sore after last session but went away after a day.   PERTINENT HISTORY: Multiple Rt ankle surgeries From eval: Pt had an ankle surgery to remove screws 03/2022. Since then she has been doing physical therapy at another location to improve ankle ROM and hip strength. She feels she needs to continue PT to improve LE strength to return to work and play with her young children. She has some increased pain with prolonged standing and walking, still has some tenderness to touch Rt ankle. Pain eases with rest and estim.  PAIN:  Are you having pain? Yes: NPRS scale: 1/10 Pain location: Rt ankle Pain description: sore Aggravating factors: prolonged standing and walking Relieving factors: rest, estim  PRECAUTIONS: None  WEIGHT BEARING RESTRICTIONS: No  FALLS:  Has patient fallen in last 6 months? No  OCCUPATION:  currently out of work, worked Arboriculturist tshirts  PLOF: Independent  PATIENT GOALS: be able to play with her kids and return to work  NEXT MD VISIT: March 2024  OBJECTIVE:    PATIENT SURVEYS:  FOTO 49  EDEMA:  Significant edema Rt ankle  PALPATION: TTP Rt lateral ankle  LOWER EXTREMITY ROM:  Active ROM Right eval Left eval  Hip flexion    Hip extension    Hip abduction    Hip adduction    Hip internal rotation     Hip external rotation    Knee flexion    Knee extension    Ankle dorsiflexion 0   Ankle plantarflexion 45   Ankle inversion 28   Ankle eversion 12    (Blank rows = not tested)  LOWER EXTREMITY MMT:  MMT Right eval Left eval  Hip flexion 4 4+  Hip extension 4- 4-  Hip abduction 4- 4  Hip adduction    Hip internal rotation    Hip external rotation    Knee flexion 4- 4  Knee extension    Ankle dorsiflexion 4-   Ankle plantarflexion 4- 4  Ankle inversion 3   Ankle eversion 3+    (Blank rows = not tested)   FUNCTIONAL TESTS:  SLS: LT >15 seconds, Rt unable (eval)  Rt 8 sec (06/30/22) 5x STS: 27.53 sec  GAIT: Distance walked: 100' Assistive device utilized: None Level of assistance: Complete Independence Comments: antalgic gait with decreased stance on Rt, decreased Rt ankle DF and PF during gait   TODAY'S TREATMENT:   [facility] Adult PT Treatment:                                                DATE: 07/14/22 Therapeutic Exercise: *** Manual Therapy: *** Neuromuscular re-ed: *** Therapeutic Activity: *** Gait: *** Modalities: *** Self Care: ***  [facility] Adult PT Treatment:                                                DATE: 06/30/22 Therapeutic Exercise: Treadmill 1.5 mph x 8 min [place] 10x10 sec R&L SLS with intermittent UE support 4x10 sec Eccentric heel raise 10x3 sec Gastroc stretch x30 sec Gait: Standing heel strike with ankle ER green TB 2x10 Amb with focus on R glute set during stance to reduce trendeleburg pattern                                                                                                                              [facility] Adult PT Treatment:  DATE: 06/23/22 Therapeutic Exercise: Rockerboard fwd/bwd DL 2x10, R SL 2x10 Rockerboard static fwd/bwd 2x30 sec Rockerboard laterally inv x10, ev x10 R only Rockerboard static laterally 2x30 sec [place] 10x10 sec Gait: Standing  heel strike with ankle ER green TB 2x10 Amb with focus on R glute set during stance to reduce trendeleburg pattern    [facility] Adult PT Treatment:                                                DATE: 06/16/22 Therapeutic Exercise: Nustep L6 x 5 min Sitting BAPS lvl 4 forward/backward, inv/ev 2x10 Standing Heel raise in tandem stance 2x10 with eccentric drop Toe raise in tandem stance 2x10 Woodpecker 2x10 Side step red TB 2x10 Marching red TB 2x10   PATIENT EDUCATION:  Education details: PT POC and goals, HEP Person educated: Patient Education method: Consulting civil engineer, Media planner, and Handouts Education comprehension: verbalized understanding and returned demonstration  HOME EXERCISE PROGRAM: Pt has HEP for ankle 4 way with theraband and supine/sidelying hip strengthening: continue Access Code: [email or web address] URL: [email or web address] Date: 06/08/2022 Prepared by: Isabelle Course  Exercises - Side Stepping with Resistance at Feet  - 1 x daily - 7 x weekly - 3 sets - 10 reps - Standing Single Leg Stance with Counter Support  - 1 x daily - 7 x weekly - 1 sets - 10 reps - 10-15 seconds hold - Sit to Stand Without Arm Support  - 1 x daily - 7 x weekly - 2 sets - 10 reps  ASSESSMENT:  CLINICAL IMPRESSION: *** Continued improvements noted with R single leg stability and calf strength. Able to tolerate ~8 sec of R SLS. Working on improving endurance -- able to tolerate 8 minutes on treadmill. Discussed with pt to try and increase her walking at home to at least 10 minutes twice a day if able. Pt would benefit from continued   OBJECTIVE IMPAIRMENTS: Abnormal gait, decreased balance, difficulty walking, decreased ROM, decreased strength, and pain.     GOALS: Goals reviewed with patient? Yes  SHORT TERM GOALS: Target date: 06/22/2022   Pt will be independent in initial HEP Baseline: HEP not initiated Goal status: MET   LONG TERM GOALS: Target date: 07/20/2022    Pt  will be independent with advanced HEP Baseline: HEP not initiated Goal status: INITIAL  2.  Pt will improve FOTO to >= 64 to demo improved functional mobility Baseline: FOTO 49 Goal status: INITIAL  3.  Pt will improve bilat LE strength to 4+/5 to improve standing and walking tolerance Baseline: 4-/5 Goal status: INITIAL  4.  Pt will tolerate walking x 20 minutes with improved gait pattern with pain <= 1/10 Baseline: walking limited to 5 minutes Goal status: INITIAL  5.  Pt will improve SLS on Rt to >= 10 seconds to demo improved balance Baseline: unable to tolerate SLS on Rt Goal status: INITIAL    PLAN:  PT FREQUENCY: 1-2x/week  PT DURATION: 6 weeks  PLANNED INTERVENTIONS: Therapeutic exercises, Therapeutic activity, Neuromuscular re-education, Balance training, Gait training, Patient/Family education, Self Care, Joint mobilization, Stair training, Aquatic Therapy, Dry Needling, Electrical stimulation, Taping, Manual therapy, and Re-evaluation  PLAN FOR NEXT SESSION: Ankle and LE strength, balance, endurance   Gelene Recktenwald April Ma L Mathis Cashman, PT 07/14/2022, 7:53 AM

## 2022-07-21 ENCOUNTER — Ambulatory Visit: Payer: Medicaid Other | Admitting: Physical Therapy

## 2022-07-22 ENCOUNTER — Ambulatory Visit (HOSPITAL_BASED_OUTPATIENT_CLINIC_OR_DEPARTMENT_OTHER): Payer: Medicaid Other | Attending: Student | Admitting: Physical Therapy

## 2022-07-28 ENCOUNTER — Encounter (HOSPITAL_BASED_OUTPATIENT_CLINIC_OR_DEPARTMENT_OTHER): Payer: Self-pay

## 2022-07-28 ENCOUNTER — Emergency Department (HOSPITAL_BASED_OUTPATIENT_CLINIC_OR_DEPARTMENT_OTHER)
Admission: EM | Admit: 2022-07-28 | Discharge: 2022-07-28 | Disposition: A | Discharge status: Home / Self Care | Payer: Medicaid Other | Attending: Emergency Medicine | Admitting: Emergency Medicine

## 2022-07-28 ENCOUNTER — Ambulatory Visit: Payer: Medicaid Other | Attending: Student | Admitting: Physical Therapy

## 2022-07-28 ENCOUNTER — Emergency Department (HOSPITAL_BASED_OUTPATIENT_CLINIC_OR_DEPARTMENT_OTHER): Payer: Medicaid Other

## 2022-07-28 DIAGNOSIS — Z7982 Long term (current) use of aspirin: Secondary | ICD-10-CM | POA: Insufficient documentation

## 2022-07-28 DIAGNOSIS — R0789 Other chest pain: Secondary | ICD-10-CM | POA: Insufficient documentation

## 2022-07-28 DIAGNOSIS — R079 Chest pain, unspecified: Secondary | ICD-10-CM | POA: Diagnosis present

## 2022-07-28 LAB — BASIC METABOLIC PANEL
Anion gap: 8 (ref 5–15)
BUN: 11 mg/dL (ref 6–20)
CO2: 24 mmol/L (ref 22–32)
Calcium: 9 mg/dL (ref 8.9–10.3)
Chloride: 103 mmol/L (ref 98–111)
Creatinine, Ser: 0.76 mg/dL (ref 0.44–1.00)
GFR, Estimated: >60 mL/min (ref >60)
Glucose, Bld: 109 mg/dL — ABNORMAL HIGH (ref 70–99)
Potassium: 3.7 mmol/L (ref 3.5–5.1)
Sodium: 135 mmol/L (ref 135–145)

## 2022-07-28 LAB — CBC
HCT: 34.5 % — ABNORMAL LOW (ref 36.0–46.0)
Hemoglobin: 10 g/dL — ABNORMAL LOW (ref 12.0–15.0)
MCH: 20.7 pg — ABNORMAL LOW (ref 26.0–34.0)
MCHC: 29 g/dL — ABNORMAL LOW (ref 30.0–36.0)
MCV: 71.3 fL — ABNORMAL LOW (ref 80.0–100.0)
Platelets: 457 10*3/uL — ABNORMAL HIGH (ref 150–400)
RBC: 4.84 MIL/uL (ref 3.87–5.11)
RDW: 17 % — ABNORMAL HIGH (ref 11.5–15.5)
WBC: 9.1 10*3/uL (ref 4.0–10.5)
nRBC: 0 % (ref 0.0–0.2)

## 2022-07-28 LAB — PREGNANCY, URINE: Preg Test, Ur: NEGATIVE

## 2022-07-28 LAB — TROPONIN I (HIGH SENSITIVITY): Troponin I (High Sensitivity): <2 ng/L (ref <18)

## 2022-07-28 MED ORDER — ALUM & MAG HYDROXIDE-SIMETH 200-200-20 MG/5ML PO SUSP
15.0000 mL | Freq: Once | ORAL | Status: AC
  Administered 2022-07-28: 15 mL via ORAL
  Filled 2022-07-28: qty 30

## 2022-07-28 MED ORDER — KETOROLAC TROMETHAMINE 15 MG/ML IJ SOLN
15.0000 mg | Freq: Once | INTRAMUSCULAR | Status: AC
  Administered 2022-07-28: 15 mg via INTRAVENOUS
  Filled 2022-07-28: qty 1

## 2022-07-28 MED ORDER — FAMOTIDINE 20 MG PO TABS
20.0000 mg | ORAL_TABLET | Freq: Once | ORAL | Status: AC
  Administered 2022-07-28: 20 mg via ORAL
  Filled 2022-07-28: qty 1

## 2022-07-28 NOTE — ED Triage Notes (Signed)
C/o left sided chest pain radiating to arm since last night. States woke up with worsening pain. States pain improves when she stands up. Denies shortness of breath.

## 2022-07-28 NOTE — Discharge Instructions (Addendum)
As discussed, your workup today was overall reassuring.  Chest x-ray was without abnormality.  Heart enzyme was normal.  I think that your pain is more musculoskeletal.  Recommend treatment at home with nonsteroidals in the form of ibuprofen as well as continuation of your reflux medicine.  Recommend follow-up with your primary care for reassessment of your symptoms.  Please do not hesitate to return to emergency department for worrisome signs and symptoms we discussed to become apparent.

## 2022-07-28 NOTE — ED Notes (Signed)
Discharge instructions reviewed with patient. Patient verbalizes understanding, no further questions at this time. Medications and follow up information provided. No acute distress noted at time of departure.  

## 2022-07-28 NOTE — ED Provider Notes (Signed)
[place] [facility] [place] [facility] Provider Note   CSN: [id number] Arrival date & time: 07/28/22  [id number]     History  Chief Complaint  Patient presents with   Chest Pain    Harry Scherer is a 32 y.o. female.   Chest Pain   32 year old female presents to the emergency department with complaints of chest pain.  Patient reports left-sided chest pain beginning last night when she was laying down.  States the pain has been constant since onset.  States that pain is worsened whenever she lies flat and is relieved with standing upright.  Denies fever, chills, cough, congestion, shortness of breath, nausea, vomiting, abdominal pain.  States the pain somewhat radiates to her left shoulder.  Denies history of DVT/PE, recent surgery/immobilization/travel, known coagulopathy, known malignancy.  Last menstrual period on 07/12/2022.  Past medical history significant for hyperlipidemia, GERD, bipolar 1 disorder, anxiety, schizophrenia  Home Medications Prior to Admission medications   Medication Sig Start Date End Date Taking? Authorizing Provider  acetaminophen (TYLENOL) 650 MG CR tablet Take 650 mg by mouth every 8 (eight) hours as needed for pain.    [provider]  albuterol (VENTOLIN HFA) 108 (90 Base) MCG/ACT inhaler Inhale 2 puffs into the lungs every 6 (six) hours as needed for wheezing or shortness of breath. Asthma    [provider]  aspirin EC 81 MG tablet Take 1 tablet (81 mg total) by mouth daily. Swallow whole. Patient not taking: Reported on 03/17/2022 12/15/21   Corinne Ports, PA-C  cholecalciferol (VITAMIN D3) 25 MCG (1000 UNIT) tablet Take 1,000 Units by mouth in the morning.    [provider]  doxycycline (VIBRAMYCIN) 100 MG capsule Take 1 capsule (100 mg total) by mouth 2 (two) times daily. 02/28/22   Clark, Meghan R, PA  ferrous sulfate 325 (65 FE) MG tablet Take 325 mg by mouth in the morning.    [provider]   metroNIDAZOLE (FLAGYL) 500 MG tablet Take 1 tablet (500 mg total) by mouth 2 (two) times daily. 02/28/22   Theressa Stamps R, PA  Multiple Vitamins-Minerals (MULTIVITAMIN WITH MINERALS) tablet Take 1 tablet by mouth in the morning.    [provider]  omeprazole (PRILOSEC) 40 MG capsule Take 40 mg by mouth in the morning and at bedtime. 02/07/22   [provider]  ondansetron (ZOFRAN-ODT) 4 MG disintegrating tablet Take 1 tablet (4 mg total) by mouth every 8 (eight) hours as needed for nausea or vomiting. 01/26/22   Quintella Reichert, MD  oxyCODONE-acetaminophen (PERCOCET) 5-325 MG tablet Take 1 tablet by mouth every 6 (six) hours as needed for severe pain. 03/18/22   Corinne Ports, PA-C      Allergies    Shellfish allergy and Amoxicillin    Review of Systems   Review of Systems  Cardiovascular:  Positive for chest pain.  All other systems reviewed and are negative.   Physical Exam Updated Vital Signs BP (!) 139/93 (BP Location: Right Arm)   Pulse 97   Temp 98.3 F (36.8 C) (Oral)   Resp 17   Ht 5\' 11"  (1.803 m)   Wt 117.9 kg   LMP 07/12/2022 (Approximate)   SpO2 100%   BMI 36.26 kg/m  Physical Exam Vitals and nursing note reviewed.  Constitutional:      General: She is not in acute distress.    Appearance: She is well-developed.  HENT:     Head: Normocephalic and atraumatic.  Eyes:  Conjunctiva/sclera: Conjunctivae normal.  Cardiovascular:     Rate and Rhythm: Normal rate and regular rhythm.     Heart sounds: No murmur heard. Pulmonary:     Effort: Pulmonary effort is normal. No respiratory distress.     Breath sounds: Normal breath sounds.  Chest:     Chest wall: Tenderness present.     Comments: Pain is reproducible by palpation of left chest wall. Abdominal:     Palpations: Abdomen is soft.     Tenderness: There is no abdominal tenderness.  Musculoskeletal:        General: No swelling.     Cervical back: Neck supple.     Right lower leg: No  edema.     Left lower leg: No edema.  Skin:    General: Skin is warm and dry.     Capillary Refill: Capillary refill takes less than 2 seconds.  Neurological:     Mental Status: She is alert.  Psychiatric:        Mood and Affect: Mood normal.     ED Results / Procedures / Treatments   Labs (all labs ordered are listed, but only abnormal results are displayed) Labs Reviewed  BASIC METABOLIC PANEL - Abnormal; Notable for the following components:      Result Value   Glucose, Bld 109 (*)    All other components within normal limits  CBC - Abnormal; Notable for the following components:   Hemoglobin 10.0 (*)    HCT 34.5 (*)    MCV 71.3 (*)    MCH 20.7 (*)    MCHC 29.0 (*)    RDW 17.0 (*)    Platelets 457 (*)    All other components within normal limits  PREGNANCY, URINE  TROPONIN I (HIGH SENSITIVITY)    EKG None  Radiology DG Chest Port 1 View  Result Date: 07/28/2022 CLINICAL DATA:  LEFT-sided chest pain radiating into the LEFT arm since last night. EXAM: PORTABLE CHEST 1 VIEW COMPARISON:  06/13/2022 FINDINGS: The heart size and mediastinal contours are within normal limits. Both lungs are clear. The visualized skeletal structures are unremarkable. IMPRESSION: No active disease. Electronically Signed   By: Nolon Nations M.D.   On: 07/28/2022 09:32    Procedures Procedures    Medications Ordered in ED Medications  alum & mag hydroxide-simeth (MAALOX/MYLANTA) 200-200-20 MG/5ML suspension 15 mL (15 mLs Oral Given 07/28/22 0942)  famotidine (PEPCID) tablet 20 mg (20 mg Oral Given 07/28/22 0942)  ketorolac (TORADOL) 15 MG/ML injection 15 mg (15 mg Intravenous Given 07/28/22 [id number])    ED Course/ Medical Decision Making/ A&P                             Medical Decision Making Amount and/or Complexity of Data Reviewed Labs: ordered. Radiology: ordered.  Risk OTC drugs. Prescription drug management.   This patient presents to the ED for concern of chest pain, this  involves an extensive number of treatment options, and is a complaint that carries with it a high risk of complications and morbidity.  The differential diagnosis includes PE, pneumothorax, ACS, pneumomediastinum, GERD, musculoskeletal, CHF, peritonitis/myocarditis/tamponade, dissection, aneurysm   Co morbidities that complicate the patient evaluation  See HPI   Additional history obtained:  Additional history obtained from EMR External records from outside source obtained and reviewed including hospital records   Lab Tests:  I Ordered, and personally interpreted labs.  The pertinent results include: Anemia with a  hemoglobin of 10 of which seems to be improved from patient's baseline.  Thrombocytosis of 457 of which seems to be patient's baseline.  No evidence of leukocytosis.  No electrolyte abnormalities.  No renal dysfunction.  Troponin of less than 2.  Urine pregnancy negative.   Imaging Studies ordered:  I ordered imaging studies including chest x-ray I independently visualized and interpreted imaging which showed no acute abnormality. I agree with the radiologist interpretation   Cardiac Monitoring: / EKG:  The patient was maintained on a cardiac monitor.  I personally viewed and interpreted the cardiac monitored which showed an underlying rhythm of: Sinus rhythm with RSR in V1 and V2.  Early repolarization pattern appreciated with mild ST elevation.  Consultations Obtained:  N/a   Problem List / ED Course / Critical interventions / Medication management  Chest wall pain I ordered medication including Toradol, Maalox, famotidine  Reevaluation of the patient after these medicines showed that the patient improved I have reviewed the patients home medicines and have made adjustments as needed   Social Determinants of Health:  Chronic cigarette use.  Denies illicit drug use.   Test / Admission - Considered:  Chest wall pain Vitals signs  within normal range and  stable throughout visit. Laboratory/imaging studies significant for: See above 32 year old female presents emergency department complaints of chest pain.  Chest pain seems more associated with chest wall pain given reproducibility on exam.  Doubt ACS given negative troponin and no acute ischemic changes on EKG.  Doubt PE given that patient is PERC negative.  Doubt dissection, aneurysm, coagulated/myocarditis/tamponade.  Patient responded well to medications administered while emergency department.  Recommend continued outpatient medications of the same in the form of NSAIDs.  Recommend routine follow-up with primary care for reassessment of symptoms.  Treatment plan discussed at length with patient and she acknowledged understanding and was agreeable to said plan. Worrisome signs and symptoms were discussed with the patient, and the patient acknowledged understanding to return to the ED if noticed. Patient was stable upon discharge.          Final Clinical Impression(s) / ED Diagnoses Final diagnoses:  Chest pain, unspecified type    Rx / DC Orders ED Discharge Orders     None         Wilnette Kales, [place] 07/28/22 1031    Fredia Sorrow, MD 07/28/22 1101

## 2022-08-05 ENCOUNTER — Ambulatory Visit (HOSPITAL_BASED_OUTPATIENT_CLINIC_OR_DEPARTMENT_OTHER): Payer: Medicaid Other | Admitting: Physical Therapy

## 2022-08-12 ENCOUNTER — Ambulatory Visit (HOSPITAL_BASED_OUTPATIENT_CLINIC_OR_DEPARTMENT_OTHER): Payer: Medicaid Other | Admitting: Physical Therapy

## 2022-08-17 ENCOUNTER — Ambulatory Visit: Payer: Medicaid Other | Attending: Student | Admitting: Physical Therapy

## 2022-08-17 DIAGNOSIS — R600 Localized edema: Secondary | ICD-10-CM | POA: Insufficient documentation

## 2022-08-17 DIAGNOSIS — R2689 Other abnormalities of gait and mobility: Secondary | ICD-10-CM | POA: Insufficient documentation

## 2022-08-17 DIAGNOSIS — R262 Difficulty in walking, not elsewhere classified: Secondary | ICD-10-CM | POA: Insufficient documentation

## 2022-08-17 DIAGNOSIS — M6281 Muscle weakness (generalized): Secondary | ICD-10-CM | POA: Insufficient documentation

## 2022-08-17 DIAGNOSIS — M25671 Stiffness of right ankle, not elsewhere classified: Secondary | ICD-10-CM | POA: Insufficient documentation

## 2022-08-19 ENCOUNTER — Ambulatory Visit (HOSPITAL_BASED_OUTPATIENT_CLINIC_OR_DEPARTMENT_OTHER): Payer: Medicaid Other | Admitting: Physical Therapy

## 2022-09-11 NOTE — Progress Notes (Unsigned)
NEW PATIENT Date of Service/Encounter:  09/12/22 Referring provider: [facility] Primary care provider: [facility]  Subjective:  Gina Martin is a 32 y.o. female with a PMHx of GERD presenting today for evaluation of chronic rhinitis, amoxicillin allergy, venom allergy. History obtained from: chart review and patient and her daughter who is also here today as a patient.  Amoxicillin allergy:  Skin rash, many years since she had any.  Chronic rhinitis:  Symptoms include: congestion, eyes watery/itchy/red, ear pain, sneezing attacks Occurs year-round with seasonal flares in summer Treatments tried: cetirizine BID Previous allergy testing: no History of reflux/heartburn: yes-omeprazole 40 mg BID, not being followed by GI, somewhat controlled Previous sinus, ear, tonsil, adenoid surgeries: no  Bee stings and ants:  Feels heartbeat beating in her head, gets hot. Feelings of impending doom. She has passed out before from different stings. States it happens with everything except mosquitos.  Has happened with both stinging insects and fire ants. She has treated herself with epipen which helped calm her down. She does carry an expired epipen.  She eats shellfish now, she didn't when she was pregnant and for that it was on her chart. She is aware it will be removed.   Shortness of breath: She was prescribed an inhaler that she only uses when she is sick.   Never officially diagnosed with asthma. Inhaler does help her get up the phlegm, but not using on a day to day basis.   She will use it around 5 to 6 times per year.  She does feel shortness of breath and will use it for that symptoms specifically.  Eczema:  Flares on her neck and shoulders.  Does not have any topical steroids.  Avoiding fragrance and dye free products.   Other allergy screening: Vaccinations from childhood are up to date. She has does get flu vaccines yearly or covid  vaccines.  Past Medical History: Past Medical History:  Diagnosis Date   Anemia    Anxiety    Arthritis    Asthma    Bipolar 1 disorder (HCC)    COVID 2022   mild   COVID 08/2020   states she was very sick, not hospitalized   GERD (gastroesophageal reflux disease)    Hyperlipidemia    PONV (postoperative nausea and vomiting)    Preeclampsia    Schizophrenia (HCC)    Sleep apnea    does not use a cpap any longer   Medication List:  Current Outpatient Medications  Medication Sig Dispense Refill   acetaminophen (TYLENOL) 650 MG CR tablet Take 650 mg by mouth every 8 (eight) hours as needed for pain.     albuterol (VENTOLIN HFA) 108 (90 Base) MCG/ACT inhaler Inhale 2 puffs into the lungs every 6 (six) hours as needed for wheezing or shortness of breath. 8 g 2   azelastine (ASTELIN) 0.1 % nasal spray Place 2 sprays into both nostrils 2 (two) times daily as needed for rhinitis. Use in each nostril as directed 30 mL 5   cholecalciferol (VITAMIN D3) 25 MCG (1000 UNIT) tablet Take 1,000 Units by mouth in the morning.     EPINEPHrine (EPIPEN 2-PAK) 0.3 mg/0.3 mL IJ SOAJ injection Inject 0.3 mg into the muscle as needed for anaphylaxis. 2 each 2   ferrous sulfate 325 (65 FE) MG tablet Take 325 mg by mouth in the morning.     fluticasone (FLONASE) 50 MCG/ACT nasal spray Place 1 spray into both nostrils daily.  16 g 5   hydrocortisone 2.5 % ointment Apply topically twice daily as needed to red sandpapery rash. 30 g 2   montelukast (SINGULAIR) 10 MG tablet Take 1 tablet (10 mg total) by mouth at bedtime. 32 tablet 5   Multiple Vitamins-Minerals (MULTIVITAMIN WITH MINERALS) tablet Take 1 tablet by mouth in the morning.     Olopatadine HCl (PATADAY) 0.2 % SOLN Place 1 drop into both eyes daily as needed. 2.5 mL 5   omeprazole (PRILOSEC) 40 MG capsule Take 40 mg by mouth in the morning and at bedtime.     triamcinolone ointment (KENALOG) 0.1 % Apply topically twice daily to BODY as needed for red,  sandpaper like rash.  Do not use on face, groin or armpits. 80 g 1   aspirin EC 81 MG tablet Take 1 tablet (81 mg total) by mouth daily. Swallow whole. (Patient not taking: Reported on 03/17/2022) 30 tablet 0   doxycycline (VIBRAMYCIN) 100 MG capsule Take 1 capsule (100 mg total) by mouth 2 (two) times daily. 28 capsule 0   metroNIDAZOLE (FLAGYL) 500 MG tablet Take 1 tablet (500 mg total) by mouth 2 (two) times daily. 28 tablet 0   ondansetron (ZOFRAN-ODT) 4 MG disintegrating tablet Take 1 tablet (4 mg total) by mouth every 8 (eight) hours as needed for nausea or vomiting. (Patient not taking: Reported on 09/12/2022) 8 tablet 0   oxyCODONE-acetaminophen (PERCOCET) 5-325 MG tablet Take 1 tablet by mouth every 6 (six) hours as needed for severe pain. (Patient not taking: Reported on 09/12/2022) 20 tablet 0   No current facility-administered medications for this visit.   Known Allergies:  Allergies  Allergen Reactions   Amoxicillin Rash and Hives   Past Surgical History: Past Surgical History:  Procedure Laterality Date   CESAREAN SECTION     EXTERNAL FIXATION LEG Right 05/07/2021   Procedure: EXTERNAL FIXATION AND IRRIGATION AND DEBRIDMENT  PILON;  Surgeon: Roby Lofts, MD;  Location: [facility];  Service: Orthopedics;  Laterality: Right;   EXTERNAL FIXATION REMOVAL Right 05/10/2021   Procedure: REMOVAL EXTERNAL FIXATION LEG;  Surgeon: Roby Lofts, MD;  Location: [facility];  Service: Orthopedics;  Laterality: Right;   HARDWARE REMOVAL Right 12/15/2021   Procedure: HARDWARE REMOVAL RIGHT TIBIA;  Surgeon: Roby Lofts, MD;  Location: [facility];  Service: Orthopedics;  Laterality: Right;   HARDWARE REMOVAL Right 03/18/2022   Procedure: HARDWARE REMOVAL RIGHT ANKLE;  Surgeon: Roby Lofts, MD;  Location: [facility];  Service: Orthopedics;  Laterality: Right;   OPEN REDUCTION INTERNAL FIXATION (ORIF) TIBIA/FIBULA FRACTURE Right 05/10/2021   Procedure: OPEN REDUCTION INTERNAL FIXATION (ORIF) TIBIA/FIBULA  FRACTURE;  Surgeon: Roby Lofts, MD;  Location: [facility];  Service: Orthopedics;  Laterality: Right;   OPEN REDUCTION INTERNAL FIXATION (ORIF) TIBIA/FIBULA FRACTURE Right 12/15/2021   Procedure: 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;  Surgeon: Roby Lofts, MD;  Location: [facility];  Service: Orthopedics;  Laterality: Right;   ORIF PATELLA Left 05/07/2021   Procedure: OPEN REDUCTION INTERNAL (ORIF) FIXATION PATELLA;  Surgeon: Roby Lofts, MD;  Location: [facility];  Service: Orthopedics;  Laterality: Left;   TUBAL LIGATION     Family History: History reviewed. No pertinent family history. Social History: Goldye lives in a house without water damage, wood floors, Architectural technologist, central AC, outdoor dog, no roaches, using dust mite protection on the bedding and pillows, smokes 0.5 PPD of [place] shorts made slicks.  + HEPA filter in  the home.  [place]   ROS:  All other systems negative except as noted per HPI.  Objective:  Blood pressure 128/76, pulse 98, temperature 98 F (36.7 C), temperature source Temporal, resp. rate 20, height 5' 10.5" (1.791 m), weight 264 lb 12.8 oz (120.1 kg), SpO2 96 %. Body mass index is 37.46 kg/m. Physical Exam:  General Appearance:  Alert, cooperative, no distress, appears stated age  Head:  Normocephalic, without obvious abnormality, atraumatic  Eyes:  Conjunctiva clear, EOM's intact  Nose: Nares normal, hypertrophic turbinates, normal mucosa, and no visible anterior polyps  Throat: Lips, tongue normal; teeth and gums normal, normal posterior oropharynx  Neck: Supple, symmetrical  Lungs:   clear to auscultation bilaterally, Respirations unlabored, no coughing  Heart:  regular rate and rhythm and no murmur, Appears well perfused  Extremities: No edema  Skin: Skin color, texture, turgor normal and no rashes or lesions on visualized portions of skin  Neurologic: No gross deficits      Diagnostics: Spirometry:  Tracings reviewed. Her effort: poor technique, effort moderate FVC: 2.88L (pre), 3.15L  (post), +9% FEV1: 2.28L, 69% predicted (pre), 2.47L, 75% predicted (post) +8% FEV1/FVC ratio: 0.79 (pre), 0.78 (post) Interpretation: Spirometry consistent with possible restrictive disease, nonobstructive ratio, low FEV1 with partial bronchodilator response  Skin Testing: Environmental allergy panel and fire ant.  Adequate controls. Results discussed with patient/family.  Airborne Adult Perc - 09/12/22 0955     Time Antigen Placed 0950    Allergen Manufacturer Waynette Buttery    Location Back    Number of Test 59    Panel 1 Select    1. Control-Buffer 50% Glycerol Negative    2. Control-Histamine 1 mg/ml 3+    3. Albumin saline 3+    4. Bahia 3+    5. French [place] Negative    6. Johnson Negative    7. Kentucky Blue Negative    8. Meadow Fescue Negative    9. Perennial Rye 4+    10. Sweet Vernal Negative    11. Timothy Negative    12. Cocklebur 3+    13. Burweed Marshelder 3+    14. Ragweed, short 2+    15. Ragweed, Giant 2+    16. Plantain,  English 2+    17. Lamb's Quarters Negative    18. Sheep Sorrell Negative    19. Rough Pigweed Negative    20. Marsh Elder, Rough 3+    21. Mugwort, Common Negative    22. Ash mix Negative    23. Birch mix 4+    24. Beech American 3+    25. Box, Elder Negative    26. Cedar, red Negative    27. Cottonwood, Eastern 2+    28. Elm mix 3+    29. Hickory 3+    30. Maple mix Negative    31. Oak, Guinea-Bissau mix Negative    32. Pecan Pollen Negative    33. Pine mix Negative    34. Sycamore Eastern Negative    35. Walnut, Black Pollen Negative    36. Alternaria alternata Negative    37. Cladosporium Herbarum 2+    38. Aspergillus mix Negative    39. Penicillium mix Negative    40. Bipolaris sorokiniana (Helminthosporium) Negative    41. Drechslera spicifera (Curvularia) Negative    42. Mucor plumbeus Negative    43. Fusarium  moniliforme Negative    44. Aureobasidium pullulans (pullulara) Negative    45. Rhizopus oryzae Negative  46. Botrytis cinera Negative    47. Epicoccum nigrum Negative    48. Phoma betae 2+    49. Candida Albicans Negative    50. Trichophyton mentagrophytes Negative    51. Mite, D Farinae  5,000 AU/ml Negative    52. Mite, D Pteronyssinus  5,000 AU/ml Negative    53. Cat Hair 10,000 BAU/ml Negative    54.  Dog Epithelia Negative    55. Mixed Feathers Negative    56. Horse Epithelia Negative    57. Cockroach, German Negative    58. Mouse Negative    59. Tobacco Leaf Negative    Comments fireant- negative             Allergy testing results were read and interpreted by myself, documented by clinical staff.  Assessment and Plan  Shortness of breath-concern for intermittent asthma: - your lung testing today did not show obstruction, but did get slightly better after using albuterol - Rescue Meds: Albuterol (Proair/Ventolin) 2 puffs . Use  every 4-6 hours as needed for chest tightness, wheezing, or coughing.  Can also use 15 minutes prior to exercise if you have symptoms with activity. - Asthma is not controlled if:  - Symptoms are occurring >2 times a week OR  - >2 times a month nighttime awakenings  - You are requiring systemic steroids (prednisone/steroid injections) more than once per year  - Your require hospitalization for your asthma.  - Please call the clinic to schedule a follow up if these symptoms arise - let your primary care know when you are ready to quit smoking  Chronic Rhinitis: determined to be Seasonal and Perennial Allergic: - allergy testing today: positive to grass, weed and tree pollen, and indoor/outdoor molds - Prevention:  - allergen avoidance when possible - consider allergy shots as long term control of your symptoms by teaching your immune system to be more tolerant of your allergy triggers - Symptom control: - Start Flonase (fluticasone) 1- 2  sprays in each nostril daily.  - Start Astelin (azelastine) 1-2 sprays in each nostril twice a day as needed for nasal congestion/itchy nose - Start Singulair (Montelukast) 10mg  nightly.  - Continue Zyrtec (Cetirizine) 10mg  daily as needed  Allergic Conjunctivitis:  - Start Allergy Eye drops-Pataday-1 drop daily as needed  Concern For Stinging Insect Allergy: - based on today's history, will obtain labs for stinging insects and fire ant - fire ant skin testing negative - if negative, would consider confirming with skin testing - please carry Epinephrine autoinjector at all times in case of accidental sting, to be used if stung and has SKIN AND ANY OTHER SYMPTOMS - for SKIN only, can take Benadryl 2 tsp (25 mg)  - recommend medical alert bracelet - practice avoidance measures as outlined below when possible    Atopic Dermatitis:  Daily Care For Maintenance (daily and continue even once eczema controlled) - Use hypoallergenic hydrating ointment at least twice daily.  This must be done daily for control of flares. (Great options include Vaseline, CeraVe, Aquaphor, Aveeno, Cetaphil, VaniCream, etc) - Avoid detergents, soaps or lotions with fragrances/dyes - Limit showers/baths to 5 minutes and use luke warm water instead of hot, pat dry following baths, and apply moisturizer - can use steroid/non-steroid therapy creams as detailed below up to twice weekly for prevention of flares.  For Flares:(add this to maintenance therapy if needed for flares) First apply steroid/non-steroid treatment creams. Wait 5 minutes then apply moisturizer.  - Triamcinolone 0.1% to body for moderate  flares-apply topically twice daily to red, raised areas of skin, followed by moisturizer. Do NOT use on face, groin or armpits. - Hydrocortisone 2.5% to face/body-apply topically twice daily to red, raised areas of skin, followed by moisturizer  H/O Penicillin allergy: - please schedule follow-up appt at your  convenience for penicillin testing followed by graded oral challenge if indicated - please refrain from taking any antihistamines at least 3 days prior to this appointment  - around 80% of individuals outgrow this allergy in ~ 10 years and carrying it as a diagnosis can prevent you from getting proper therapy if needed  Follow up : 4 months, sooner if needed It was a pleasure meeting you in clinic today! Thank you for allowing me to participate in your care.  This note in its entirety was forwarded to the Provider who requested this consultation.  Thank you for your kind referral. I appreciate the opportunity to take part in [facility] care. Please do not hesitate to contact me with questions.  Sincerely,  Tonny Bollman, MD [facility]

## 2022-09-12 ENCOUNTER — Other Ambulatory Visit: Payer: Self-pay

## 2022-09-12 ENCOUNTER — Ambulatory Visit (INDEPENDENT_AMBULATORY_CARE_PROVIDER_SITE_OTHER): Payer: Medicaid Other | Admitting: Internal Medicine

## 2022-09-12 ENCOUNTER — Encounter: Payer: Self-pay | Admitting: Internal Medicine

## 2022-09-12 VITALS — BP 128/76 | HR 98 | Temp 98.0°F | Resp 20 | Ht 70.5 in | Wt 264.8 lb

## 2022-09-12 DIAGNOSIS — J3089 Other allergic rhinitis: Secondary | ICD-10-CM | POA: Diagnosis not present

## 2022-09-12 DIAGNOSIS — L309 Dermatitis, unspecified: Secondary | ICD-10-CM | POA: Insufficient documentation

## 2022-09-12 DIAGNOSIS — L308 Other specified dermatitis: Secondary | ICD-10-CM

## 2022-09-12 DIAGNOSIS — J4520 Mild intermittent asthma, uncomplicated: Secondary | ICD-10-CM | POA: Insufficient documentation

## 2022-09-12 DIAGNOSIS — T63481A Toxic effect of venom of other arthropod, accidental (unintentional), initial encounter: Secondary | ICD-10-CM | POA: Diagnosis not present

## 2022-09-12 DIAGNOSIS — R0602 Shortness of breath: Secondary | ICD-10-CM

## 2022-09-12 DIAGNOSIS — J302 Other seasonal allergic rhinitis: Secondary | ICD-10-CM | POA: Diagnosis not present

## 2022-09-12 DIAGNOSIS — T782XXA Anaphylactic shock, unspecified, initial encounter: Secondary | ICD-10-CM

## 2022-09-12 DIAGNOSIS — H1013 Acute atopic conjunctivitis, bilateral: Secondary | ICD-10-CM | POA: Insufficient documentation

## 2022-09-12 DIAGNOSIS — Z880 Allergy status to penicillin: Secondary | ICD-10-CM

## 2022-09-12 MED ORDER — AZELASTINE HCL 0.1 % NA SOLN: 2.0000 | Freq: Two times a day (BID) | NASAL | 5 refills | Status: DC | PRN

## 2022-09-12 MED ORDER — TRIAMCINOLONE ACETONIDE 0.1 % EX OINT: TOPICAL_OINTMENT | CUTANEOUS | 1 refills | Status: AC

## 2022-09-12 MED ORDER — MONTELUKAST SODIUM 10 MG PO TABS: 10.0000 mg | ORAL_TABLET | Freq: Every day | ORAL | 5 refills | Status: DC

## 2022-09-12 MED ORDER — OLOPATADINE HCL 0.2 % OP SOLN: 1.0000 [drp] | Freq: Every day | OPHTHALMIC | 5 refills | Status: DC | PRN

## 2022-09-12 MED ORDER — ALBUTEROL SULFATE HFA 108 (90 BASE) MCG/ACT IN AERS: 2.0000 | INHALATION_SPRAY | Freq: Four times a day (QID) | RESPIRATORY_TRACT | 2 refills | Status: AC | PRN

## 2022-09-12 MED ORDER — EPINEPHRINE 0.3 MG/0.3ML IJ SOAJ: 0.3000 mg | INTRAMUSCULAR | 2 refills | Status: DC | PRN

## 2022-09-12 MED ORDER — FLUTICASONE PROPIONATE 50 MCG/ACT NA SUSP: 1.0000 | Freq: Every day | NASAL | 5 refills | Status: AC

## 2022-09-12 MED ORDER — HYDROCORTISONE 2.5 % EX OINT: TOPICAL_OINTMENT | CUTANEOUS | 2 refills | Status: AC

## 2022-09-12 NOTE — Patient Instructions (Addendum)
Shortness of breath-concern for intermittent asthma: - your lung testing today did not show obstruction, but did get slightly better after using albuterol - Rescue Meds: Albuterol (Proair/Ventolin) 2 puffs . Use  every 4-6 hours as needed for chest tightness, wheezing, or coughing.  Can also use 15 minutes prior to exercise if you have symptoms with activity. - Asthma is not controlled if:  - Symptoms are occurring >2 times a week OR  - >2 times a month nighttime awakenings  - You are requiring systemic steroids (prednisone/steroid injections) more than once per year  - Your require hospitalization for your asthma.  - Please call the clinic to schedule a follow up if these symptoms arise - let your primary care know when you are ready to quit smoking  Chronic Rhinitis: determined to be Seasonal and Perennial Allergic: - allergy testing today: positive to grass, weed and tree pollen, and indoor/outdoor molds - Prevention:  - allergen avoidance when possible - consider allergy shots as long term control of your symptoms by teaching your immune system to be more tolerant of your allergy triggers - Symptom control: - Start Flonase (fluticasone) 1- 2 sprays in each nostril daily.  - Start Astelin (azelastine) 1-2 sprays in each nostril twice a day as needed for nasal congestion/itchy nose - Start Singulair (Montelukast) 10mg  nightly.  - Continue Zyrtec (Cetirizine) 10mg  daily as needed  Allergic Conjunctivitis:  - Start Allergy Eye drops-Pataday-1 drop daily as needed  Concern For Stinging Insect Allergy: - based on today's history, will obtain labs for stinging insects and fire ant - fire ant skin testing negative - if negative, would consider confirming with skin testing - please carry Epinephrine autoinjector at all times in case of accidental sting, to be used if stung and has SKIN AND ANY OTHER SYMPTOMS - for SKIN only, can take Benadryl 2 tsp (25 mg)  - recommend medical alert  bracelet - practice avoidance measures as outlined below when possible    Atopic Dermatitis:  Daily Care For Maintenance (daily and continue even once eczema controlled) - Use hypoallergenic hydrating ointment at least twice daily.  This must be done daily for control of flares. (Great options include Vaseline, CeraVe, Aquaphor, Aveeno, Cetaphil, VaniCream, etc) - Avoid detergents, soaps or lotions with fragrances/dyes - Limit showers/baths to 5 minutes and use luke warm water instead of hot, pat dry following baths, and apply moisturizer - can use steroid/non-steroid therapy creams as detailed below up to twice weekly for prevention of flares.  For Flares:(add this to maintenance therapy if needed for flares) First apply steroid/non-steroid treatment creams. Wait 5 minutes then apply moisturizer.  - Triamcinolone 0.1% to body for moderate flares-apply topically twice daily to red, raised areas of skin, followed by moisturizer. Do NOT use on face, groin or armpits. - Hydrocortisone 2.5% to face/body-apply topically twice daily to red, raised areas of skin, followed by moisturizer  H/O Penicillin allergy: - please schedule follow-up appt at your convenience for penicillin testing followed by graded oral challenge if indicated - please refrain from taking any antihistamines at least 3 days prior to this appointment  - around 80% of individuals outgrow this allergy in ~ 10 years and carrying it as a diagnosis can prevent you from getting proper therapy if needed  Follow up : 4 months, sooner if needed It was a pleasure meeting you in clinic today! Thank you for allowing me to participate in your care.  Tonny Bollman, MD [facility]

## 2022-09-15 LAB — ALLERGENS, ZONE 2
Alternaria Alternata IgE: <0.1 kU/L
Amer Sycamore IgE Qn: 0.37 kU/L — AB
Aspergillus Fumigatus IgE: <0.1 kU/L
Bahia Grass IgE: 1.27 kU/L — AB
Bermuda Grass IgE: 0.78 kU/L — AB
Cat Dander IgE: <0.1 kU/L
Cedar, Mountain IgE: 0.31 kU/L — AB
Cladosporium Herbarum IgE: <0.1 kU/L
Cockroach, American IgE: 0.23 kU/L — AB
Common Silver Birch IgE: 3.29 kU/L — AB
D Farinae IgE: 0.16 kU/L — AB
D Pteronyssinus IgE: <0.1 kU/L
Dog Dander IgE: <0.1 kU/L
Elm, American IgE: 0.36 kU/L — AB
Hickory, White IgE: 0.75 kU/L — AB
Johnson Grass IgE: 1.06 kU/L — AB
Maple/Box Elder IgE: 0.34 kU/L — AB
Mucor Racemosus IgE: 0.17 kU/L — AB
Mugwort IgE Qn: 0.29 kU/L — AB
Nettle IgE: 0.3 kU/L — AB
Oak, White IgE: 3.17 kU/L — AB
Penicillium Chrysogen IgE: <0.1 kU/L
Pigweed, Rough IgE: 0.29 kU/L — AB
Plantain, English IgE: 0.36 kU/L — AB
Ragweed, Short IgE: 0.37 kU/L — AB
Sheep Sorrel IgE Qn: 0.35 kU/L — AB
Stemphylium Herbarum IgE: <0.1 kU/L
Sweet gum IgE RAST Ql: 1.45 kU/L — AB
Timothy Grass IgE: 2.41 kU/L — AB
White Mulberry IgE: 0.27 kU/L — AB

## 2022-09-15 LAB — ALLERGEN HYMENOPTERA PANEL
Bumblebee: 0.24 kU/L — AB
Honeybee IgE: 0.34 kU/L — AB
Hornet, White Face, IgE: 0.59 kU/L — AB
Hornet, Yellow, IgE: 0.32 kU/L — AB
Paper Wasp IgE: 1.44 kU/L — AB
Yellow Jacket, IgE: 1.79 kU/L — AB

## 2022-09-15 LAB — TRYPTASE: Tryptase: 6.8 ug/L (ref 2.2–13.2)

## 2022-09-15 LAB — ALLERGEN FIRE ANT: I070-IgE Fire Ant (Invicta): 0.25 kU/L — AB

## 2022-10-20 NOTE — Progress Notes (Signed)
FOLLOW UP Date of Service/Encounter:  10/21/22   Subjective:  Gina Martin (DOB: 03-31-1991) is a 32 y.o. female who [facility] in re-evaluation of the following: eczema, intermittent asthma, allergic rhinitis, venom allergy, penicillin allergy History obtained from: chart review and patient.  For Review, LV was on 09/12/22  with Dr.Maryse Brierley seen for intial visit for chronic rhinitis, penicillin allergy, shortness of breath and reaction to bee stings and fire ants, eczema . See below for summary of history and diagnostics.  Therapeutic plans/changes recommended: Start singulair. PRN: albuterol, flonase, astelin, zyrtec, pataday, epipen, triamcinolone, hydrocortisone. Return for penicillin challenge. ----------------------------------------------------- Pertinent History/Diagnostics:  Shortness of breath : Uses albuterol when sick, usually around 5-6 times per year - nonobstructive ratio, low FEV1 and 8% improvement postbronchodilator spirometry (09/12/22): ratio 0.799, 69% FEV1 (pre), + 75% FEV1 (post) Allergic Rhinitis:  - SPT environmental panel (09/12/22): positive to grass, weed and tree pollen, and indoor/outdoor molds  - environmental panel (09/12/22) serums positive to grass pollen, tree pollen, weed pollen, borderline molds and cockroach Eczema: Flares on neck and shoulder H/o Penicillin Allergy: Rash many years ago, avoiding since. Venom allergy:  heartbeat beating in her head, gets hot. Feelings of impending doom. She has passed out before from different stings. States it happens with everything except mosquitos.  She has self-treated with epinephrine and her symptoms resolved. - labs (09/12/22): + yellow jacket, paper wasp, white face hornet, yellow hornet, honeybee, borderline fire ant-injections recommended. Normal baseline tryptase (6.8) --------------------------------------------------- Today presents for follow-up. She is  having increased cough and shortness of breath.  Symptoms started with chest cold and it took her 3 weeks to see her PCP.  Had a CXR yesterday and it was read as normal.  However she continues to cough.  Cough is congested and worse in the morning and at night. She is taking sudafed in the morning and mucinex at nighttime.  She is using Mucinex that contains decongestant. She continues on cetirizine daily.  She did not start montelukast. She is not using azelastine very often, but it does help. She uses flonsae which helps her nose from stopping up.   Her house has been very dry so she turned on a humidifier this week. She has some nose bleeds, but she hasn't had any since starting her humidifier. She is also needing eye drops because her eyes have been burning and irritated. No fire ants or bee sting since last visit. Her skin is doing well. Will occasoinally have patches here and there, but infrequent. She did have a flare a few weeks ago, but resolved with triamcinolone.   CXR 10/20/22: normal  Allergies as of 10/21/2022       Reactions   Amoxicillin Rash, Hives        Medication List        Accurate as of October 21, 2022  1:35 PM. If you have any questions, ask your nurse or doctor.          acetaminophen 650 MG CR tablet Commonly known as: TYLENOL Take 650 mg by mouth every 8 (eight) hours as needed for pain.   albuterol 108 (90 Base) MCG/ACT inhaler Commonly known as: VENTOLIN HFA Inhale 2 puffs into the lungs every 6 (six) hours as needed for wheezing or shortness of breath.   aspirin EC 81 MG tablet Take 1 tablet (81 mg total) by mouth daily. Swallow whole.   azelastine 0.1 % nasal spray Commonly known as: ASTELIN  Place 2 sprays into both nostrils 2 (two) times daily as needed for rhinitis. Use in each nostril as directed   cholecalciferol 25 MCG (1000 UNIT) tablet Commonly known as: VITAMIN D3 Take 1,000 Units by mouth in the morning.   doxycycline 100 MG  capsule Commonly known as: VIBRAMYCIN Take 1 capsule (100 mg total) by mouth 2 (two) times daily.   EPINEPHrine 0.3 mg/0.3 mL Soaj injection Commonly known as: EpiPen 2-Pak Inject 0.3 mg into the muscle as needed for anaphylaxis.   ferrous sulfate 325 (65 FE) MG tablet Take 325 mg by mouth in the morning.   fluticasone 50 MCG/ACT nasal spray Commonly known as: FLONASE Place 1 spray into both nostrils daily.   hydrocortisone 2.5 % ointment Apply topically twice daily as needed to red sandpapery rash.   metroNIDAZOLE 500 MG tablet Commonly known as: FLAGYL Take 1 tablet (500 mg total) by mouth 2 (two) times daily.   montelukast 10 MG tablet Commonly known as: Singulair Take 1 tablet (10 mg total) by mouth at bedtime.   multivitamin with minerals tablet Take 1 tablet by mouth in the morning.   Olopatadine HCl 0.2 % Soln Commonly known as: Pataday Place 1 drop into both eyes daily as needed.   omeprazole 40 MG capsule Commonly known as: PRILOSEC Take 40 mg by mouth in the morning and at bedtime.   ondansetron 4 MG disintegrating tablet Commonly known as: ZOFRAN-ODT Take 1 tablet (4 mg total) by mouth every 8 (eight) hours as needed for nausea or vomiting.   oxyCODONE-acetaminophen 5-325 MG tablet Commonly known as: Percocet Take 1 tablet by mouth every 6 (six) hours as needed for severe pain.   triamcinolone ointment 0.1 % Commonly known as: KENALOG Apply topically twice daily to BODY as needed for red, sandpaper like rash.  Do not use on face, groin or armpits.       Past Medical History:  Diagnosis Date   Anemia    Anxiety    Arthritis    Asthma    Bipolar 1 disorder (HCC)    COVID 2022   mild   COVID 08/2020   states she was very sick, not hospitalized   GERD (gastroesophageal reflux disease)    Hyperlipidemia    PONV (postoperative nausea and vomiting)    Preeclampsia    Schizophrenia (HCC)    Sleep apnea    does not use a cpap any longer   Past  Surgical History:  Procedure Laterality Date   CESAREAN SECTION     EXTERNAL FIXATION LEG Right 05/07/2021   Procedure: EXTERNAL FIXATION AND IRRIGATION AND DEBRIDMENT  PILON;  Surgeon: Roby Lofts, MD;  Location: [facility] OR;  Service: Orthopedics;  Laterality: Right;   EXTERNAL FIXATION REMOVAL Right 05/10/2021   Procedure: REMOVAL EXTERNAL FIXATION LEG;  Surgeon: Roby Lofts, MD;  Location: [facility];  Service: Orthopedics;  Laterality: Right;   HARDWARE REMOVAL Right 12/15/2021   Procedure: HARDWARE REMOVAL RIGHT TIBIA;  Surgeon: Roby Lofts, MD;  Location: [facility];  Service: Orthopedics;  Laterality: Right;   HARDWARE REMOVAL Right 03/18/2022   Procedure: HARDWARE REMOVAL RIGHT ANKLE;  Surgeon: Roby Lofts, MD;  Location: [facility];  Service: Orthopedics;  Laterality: Right;   OPEN REDUCTION INTERNAL FIXATION (ORIF) TIBIA/FIBULA FRACTURE Right 05/10/2021   Procedure: OPEN REDUCTION INTERNAL FIXATION (ORIF) TIBIA/FIBULA FRACTURE;  Surgeon: Roby Lofts, MD;  Location: [facility];  Service: Orthopedics;  Laterality: Right;   OPEN REDUCTION INTERNAL FIXATION (ORIF) TIBIA/FIBULA  FRACTURE Right 12/15/2021   Procedure: 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;  Surgeon: Roby Lofts, MD;  Location: [facility];  Service: Orthopedics;  Laterality: Right;   ORIF PATELLA Left 05/07/2021   Procedure: OPEN REDUCTION INTERNAL (ORIF) FIXATION PATELLA;  Surgeon: Roby Lofts, MD;  Location: [facility];  Service: Orthopedics;  Laterality: Left;   TUBAL LIGATION     Otherwise, there have been no changes to her past medical history, surgical history, family history, or social history.  ROS: All others negative except as noted per HPI.   Objective:  BP 126/87 (BP Location: Left Arm, Patient Position: Sitting, Cuff Size: Large)   Pulse 85   Temp 98.2 F (36.8 C) (Temporal)   Resp 18   Ht 5\' 10"  (1.778 m)   Wt 265 lb 6.4 oz (120.4 kg)   SpO2 100%   BMI 38.08 kg/m  Body mass index is 38.08  kg/m. Physical Exam: General Appearance:  Alert, cooperative, no distress, appears stated age  Head:  Normocephalic, without obvious abnormality, atraumatic  Eyes:  Conjunctiva clear, EOM's intact  Nose: Nares normal, hypertrophic turbinates, normal mucosa, and no visible anterior polyps  Throat: Lips, tongue normal; teeth and gums normal, normal posterior oropharynx and + cobblestoning  Neck: Supple, symmetrical  Lungs:   clear to auscultation bilaterally, Respirations unlabored, intermittent dry coughing  Heart:  regular rate and rhythm and no murmur, Appears well perfused  Extremities: No edema  Skin: Skin color, texture, turgor normal and no rashes or lesions on visualized portions of skin  Neurologic: No gross deficits   Spirometry: Pre and postbronchodilator Tracings reviewed. Her effort: Good reproducible efforts. FVC: 3.0L, 3.24L FEV1: 2.43L, 73% predicted, 2.54L, 77% FEV1/FVC ratio: 0.81, 0.78 Interpretation: Nonobstructive ratio, low FEV1, possible restriction no significant improvement following postbronchodilator Please see scanned spirometry results for details.  Assessment/Plan   Intermittent asthma: current flare - Controller Meds During Respiratory Illnesses: Start Symbicort 80 mcg 2 puffs twice daily  and continue for 2 weeks or until symptoms resolve. - Rescue Meds: Albuterol (Proair/Ventolin) 2 puffs . Use  every 4-6 hours as needed for chest tightness, wheezing, or coughing.  Can also use 15 minutes prior to exercise if you have symptoms with activity. - Asthma is not controlled if:  - Symptoms are occurring >2 times a week OR  - >2 times a month nighttime awakenings  - You are requiring systemic steroids (prednisone/steroid injections) more than once per year  - Your require hospitalization for your asthma.  - Please call the clinic to schedule a follow up if these symptoms arise - let your primary care know when you are ready to quit smoking  Seasonal and  Perennial Allergic Rhinitis-not controlled Stop Sudafed. Mucinex DM or regular Mucinex 600 mg twice daily until drainage stops. Take with a glass of water and stay hydrated. Stop after one week. Samples provided - allergy testing 2024 positive to grass, weed and tree pollen, and indoor/outdoor molds - Prevention:  - allergen avoidance when possible - consider allergy shots as long term control of your symptoms by teaching your immune system to be more tolerant of your allergy triggers - Symptom control: - Start dymista 1 spray in each nostril twice a day as needed for nasal congestion/itchy nose - Start Singulair (Montelukast) 10mg  nightly.  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  This must be done daily for control of flares. (Great options include Vaseline, CeraVe, Aquaphor, Aveeno, Cetaphil, VaniCream, etc) - Avoid detergents, soaps or lotions with fragrances/dyes - Limit showers/baths to 5 minutes and use luke warm water instead of hot, pat dry following baths, and apply moisturizer - can use steroid/non-steroid therapy creams as detailed below up to twice weekly for prevention of flares.  For Flares:(add this to maintenance therapy if needed for flares) First  apply steroid/non-steroid treatment creams. Wait 5 minutes then apply moisturizer.  - Triamcinolone 0.1% to body for moderate flares-apply topically twice daily to red, raised areas of skin, followed by moisturizer. Do NOT use on face, groin or armpits. - Hydrocortisone 2.5% to face/body-apply topically twice daily to red, raised areas of skin, followed by moisturizer  H/O Penicillin allergy: stable - please schedule follow-up appt at your convenience for penicillin testing followed by graded oral challenge if indicated - please refrain from taking any antihistamines at least 3 days prior to this appointment  - around 80% of individuals outgrow this allergy in ~ 10 years and carrying it as a diagnosis can prevent you from getting proper therapy if needed  Follow up : 4 months, sooner if needed It was a pleasure seeing you again in clinic today! Thank you for allowing me to participate in your care.  Tonny Bollman, MD  [facility]

## 2022-10-21 ENCOUNTER — Ambulatory Visit (INDEPENDENT_AMBULATORY_CARE_PROVIDER_SITE_OTHER): Payer: Medicaid Other | Admitting: Internal Medicine

## 2022-10-21 ENCOUNTER — Encounter: Payer: Self-pay | Admitting: Internal Medicine

## 2022-10-21 VITALS — BP 126/87 | HR 85 | Temp 98.2°F | Resp 18 | Ht 70.0 in | Wt 265.4 lb

## 2022-10-21 DIAGNOSIS — J4521 Mild intermittent asthma with (acute) exacerbation: Secondary | ICD-10-CM

## 2022-10-21 DIAGNOSIS — T782XXD Anaphylactic shock, unspecified, subsequent encounter: Secondary | ICD-10-CM

## 2022-10-21 DIAGNOSIS — H1013 Acute atopic conjunctivitis, bilateral: Secondary | ICD-10-CM

## 2022-10-21 DIAGNOSIS — J3089 Other allergic rhinitis: Secondary | ICD-10-CM

## 2022-10-21 DIAGNOSIS — L308 Other specified dermatitis: Secondary | ICD-10-CM | POA: Diagnosis not present

## 2022-10-21 DIAGNOSIS — Z880 Allergy status to penicillin: Secondary | ICD-10-CM

## 2022-10-21 DIAGNOSIS — J302 Other seasonal allergic rhinitis: Secondary | ICD-10-CM

## 2022-10-21 DIAGNOSIS — T63481D Toxic effect of venom of other arthropod, accidental (unintentional), subsequent encounter: Secondary | ICD-10-CM

## 2022-10-21 MED ORDER — OLOPATADINE HCL 0.2 % OP SOLN: 1.0000 [drp] | Freq: Every day | OPHTHALMIC | 5 refills | Status: AC | PRN

## 2022-10-21 MED ORDER — MONTELUKAST SODIUM 10 MG PO TABS: 10.0000 mg | ORAL_TABLET | Freq: Every day | ORAL | 5 refills | Status: AC

## 2022-10-21 MED ORDER — ALBUTEROL SULFATE HFA 108 (90 BASE) MCG/ACT IN AERS
4.0000 | INHALATION_SPRAY | RESPIRATORY_TRACT | Status: AC | PRN
  Administered 2022-10-21: 4 via RESPIRATORY_TRACT

## 2022-10-21 MED ORDER — BUDESONIDE-FORMOTEROL FUMARATE 80-4.5 MCG/ACT IN AERO: INHALATION_SPRAY | RESPIRATORY_TRACT | 5 refills | Status: AC

## 2022-10-21 MED ORDER — AZELASTINE-FLUTICASONE 137-50 MCG/ACT NA SUSP: 1.0000 | Freq: Two times a day (BID) | NASAL | 5 refills | Status: DC | PRN

## 2022-10-21 NOTE — Patient Instructions (Addendum)
Intermittent asthma: - Controller Meds During Respiratory Illnesses: Start Symbicort 80 mcg 2 puffs twice daily  and continue for 2 weeks or until symptoms resolve. - Rescue Meds: Albuterol (Proair/Ventolin) 2 puffs . Use  every 4-6 hours as needed for chest tightness, wheezing, or coughing.  Can also use 15 minutes prior to exercise if you have symptoms with activity. - Asthma is not controlled if:  - Symptoms are occurring >2 times a week OR  - >2 times a month nighttime awakenings  - You are requiring systemic steroids (prednisone/steroid injections) more than once per year  - Your require hospitalization for your asthma.  - Please call the clinic to schedule a follow up if these symptoms arise - let your primary care know when you are ready to quit smoking  Seasonal and Perennial Allergic Rhinitis Stop Sudafed. Mucinex DM or regular Mucinex 600 mg twice daily until drainage stops. Take with a glass of water and stay hydrated. Stop after one week. - allergy testing 2024 positive to grass, weed and tree pollen, and indoor/outdoor molds - Prevention:  - allergen avoidance when possible - consider allergy shots as long term control of your symptoms by teaching your immune system to be more tolerant of your allergy triggers - Symptom control: - Start  dymista  1 spray in each nostril twice a day as needed for nasal congestion/itchy nose - Start Singulair (Montelukast) 10mg  nightly.  - Continue Zyrtec (Cetirizine) 10mg  daily as needed  Allergic Conjunctivitis:  - Continue Allergy Eye drops-Pataday-1 drop daily as needed  Concern For Stinging Insect Allergy: - fire ant and bee allergy testing via blood work positive - please carry Epinephrine autoinjector at all times in case of accidental sting, to be used if stung and has SKIN AND ANY OTHER SYMPTOMS - for SKIN only, can take Benadryl 2 tsp (25 mg)  - recommend medical alert bracelet - practice avoidance measures as outlined below when  possible - recommend allergy injections towards bee and fire ant venoms    Atopic Dermatitis:  Daily Care For Maintenance (daily and continue even once eczema controlled) - Use hypoallergenic hydrating ointment at least twice daily.  This must be done daily for control of flares. (Great options include Vaseline, CeraVe, Aquaphor, Aveeno, Cetaphil, VaniCream, etc) - Avoid detergents, soaps or lotions with fragrances/dyes - Limit showers/baths to 5 minutes and use luke warm water instead of hot, pat dry following baths, and apply moisturizer - can use steroid/non-steroid therapy creams as detailed below up to twice weekly for prevention of flares.  For Flares:(add this to maintenance therapy if needed for flares) First apply steroid/non-steroid treatment creams. Wait 5 minutes then apply moisturizer.  - Triamcinolone 0.1% to body for moderate flares-apply topically twice daily to red, raised areas of skin, followed by moisturizer. Do NOT use on face, groin or armpits. - Hydrocortisone 2.5% to face/body-apply topically twice daily to red, raised areas of skin, followed by moisturizer  H/O Penicillin allergy: - please schedule follow-up appt at your convenience for penicillin testing followed by graded oral challenge if indicated - please refrain from taking any antihistamines at least 3 days prior to this appointment  - around 80% of individuals outgrow this allergy in ~ 10 years and carrying it as a diagnosis can prevent you from getting proper therapy if needed  Follow up : 4 months, sooner if needed It was a pleasure seeing you again in clinic today! Thank you for allowing me to participate in your care.  Denny Peon  Maurine Minister, MD [facility]

## 2022-10-24 ENCOUNTER — Other Ambulatory Visit: Payer: Self-pay

## 2022-10-24 MED ORDER — DYMISTA 137-50 MCG/ACT NA SUSP: 1.0000 | Freq: Two times a day (BID) | NASAL | 5 refills | Status: AC | PRN

## 2022-12-12 ENCOUNTER — Other Ambulatory Visit (HOSPITAL_BASED_OUTPATIENT_CLINIC_OR_DEPARTMENT_OTHER): Payer: Self-pay | Admitting: Student

## 2022-12-12 DIAGNOSIS — S82871N Displaced pilon fracture of right tibia, subsequent encounter for open fracture type IIIA, IIIB, or IIIC with nonunion: Secondary | ICD-10-CM

## 2022-12-22 ENCOUNTER — Ambulatory Visit (HOSPITAL_BASED_OUTPATIENT_CLINIC_OR_DEPARTMENT_OTHER)
Admission: RE | Admit: 2022-12-22 | Discharge: 2022-12-22 | Disposition: A | Discharge status: Home / Self Care | Payer: MEDICAID | Source: Ambulatory Visit | Attending: Student | Admitting: Student

## 2022-12-22 DIAGNOSIS — S82871N Displaced pilon fracture of right tibia, subsequent encounter for open fracture type IIIA, IIIB, or IIIC with nonunion: Secondary | ICD-10-CM | POA: Insufficient documentation

## 2023-01-09 ENCOUNTER — Other Ambulatory Visit: Payer: Self-pay

## 2023-01-09 ENCOUNTER — Encounter (HOSPITAL_BASED_OUTPATIENT_CLINIC_OR_DEPARTMENT_OTHER): Payer: Self-pay | Admitting: *Deleted

## 2023-01-09 ENCOUNTER — Emergency Department (HOSPITAL_BASED_OUTPATIENT_CLINIC_OR_DEPARTMENT_OTHER)
Admission: EM | Admit: 2023-01-09 | Discharge: 2023-01-09 | Disposition: A | Discharge status: Home / Self Care | Payer: MEDICAID | Attending: Emergency Medicine | Admitting: Emergency Medicine

## 2023-01-09 DIAGNOSIS — J45909 Unspecified asthma, uncomplicated: Secondary | ICD-10-CM | POA: Insufficient documentation

## 2023-01-09 DIAGNOSIS — F10929 Alcohol use, unspecified with intoxication, unspecified: Secondary | ICD-10-CM

## 2023-01-09 DIAGNOSIS — F1094 Alcohol use, unspecified with alcohol-induced mood disorder: Secondary | ICD-10-CM

## 2023-01-09 DIAGNOSIS — R45851 Suicidal ideations: Secondary | ICD-10-CM | POA: Insufficient documentation

## 2023-01-09 DIAGNOSIS — F10920 Alcohol use, unspecified with intoxication, uncomplicated: Secondary | ICD-10-CM

## 2023-01-09 DIAGNOSIS — E876 Hypokalemia: Secondary | ICD-10-CM | POA: Insufficient documentation

## 2023-01-09 DIAGNOSIS — Z8616 Personal history of COVID-19: Secondary | ICD-10-CM | POA: Insufficient documentation

## 2023-01-09 DIAGNOSIS — Z7982 Long term (current) use of aspirin: Secondary | ICD-10-CM | POA: Diagnosis not present

## 2023-01-09 DIAGNOSIS — F10120 Alcohol abuse with intoxication, uncomplicated: Secondary | ICD-10-CM | POA: Diagnosis not present

## 2023-01-09 DIAGNOSIS — F1014 Alcohol abuse with alcohol-induced mood disorder: Secondary | ICD-10-CM | POA: Diagnosis present

## 2023-01-09 DIAGNOSIS — F17210 Nicotine dependence, cigarettes, uncomplicated: Secondary | ICD-10-CM | POA: Insufficient documentation

## 2023-01-09 DIAGNOSIS — Z7951 Long term (current) use of inhaled steroids: Secondary | ICD-10-CM | POA: Diagnosis not present

## 2023-01-09 DIAGNOSIS — Z592 Discord with neighbors, lodgers and landlord: Secondary | ICD-10-CM

## 2023-01-09 LAB — BASIC METABOLIC PANEL
Anion gap: 11 (ref 5–15)
BUN: 8 mg/dL (ref 6–20)
CO2: 23 mmol/L (ref 22–32)
Calcium: 8.9 mg/dL (ref 8.9–10.3)
Chloride: 104 mmol/L (ref 98–111)
Creatinine, Ser: 0.67 mg/dL (ref 0.44–1.00)
GFR, Estimated: >60 mL/min (ref >60)
Glucose, Bld: 105 mg/dL — ABNORMAL HIGH (ref 70–99)
Potassium: 3.2 mmol/L — ABNORMAL LOW (ref 3.5–5.1)
Sodium: 138 mmol/L (ref 135–145)

## 2023-01-09 LAB — CBC
HCT: 31.8 % — ABNORMAL LOW (ref 36.0–46.0)
Hemoglobin: 9.1 g/dL — ABNORMAL LOW (ref 12.0–15.0)
MCH: 19.6 pg — ABNORMAL LOW (ref 26.0–34.0)
MCHC: 28.6 g/dL — ABNORMAL LOW (ref 30.0–36.0)
MCV: 68.4 fL — ABNORMAL LOW (ref 80.0–100.0)
Platelets: 398 10*3/uL (ref 150–400)
RBC: 4.65 MIL/uL (ref 3.87–5.11)
RDW: 17.8 % — ABNORMAL HIGH (ref 11.5–15.5)
WBC: 9.2 10*3/uL (ref 4.0–10.5)
nRBC: 0 % (ref 0.0–0.2)

## 2023-01-09 LAB — RAPID URINE DRUG SCREEN, HOSP PERFORMED
Amphetamines: NOT DETECTED
Barbiturates: NOT DETECTED
Benzodiazepines: NOT DETECTED
Cocaine: NOT DETECTED
Opiates: NOT DETECTED
Tetrahydrocannabinol: NOT DETECTED

## 2023-01-09 LAB — COMPREHENSIVE METABOLIC PANEL
ALT: 16 U/L (ref 0–44)
AST: 20 U/L (ref 15–41)
Albumin: 3.8 g/dL (ref 3.5–5.0)
Alkaline Phosphatase: 103 U/L (ref 38–126)
Anion gap: 11 (ref 5–15)
BUN: 8 mg/dL (ref 6–20)
CO2: 20 mmol/L — ABNORMAL LOW (ref 22–32)
Calcium: 9 mg/dL (ref 8.9–10.3)
Chloride: 105 mmol/L (ref 98–111)
Creatinine, Ser: 0.64 mg/dL (ref 0.44–1.00)
GFR, Estimated: >60 mL/min (ref >60)
Glucose, Bld: 99 mg/dL (ref 70–99)
Potassium: 2.7 mmol/L — CL (ref 3.5–5.1)
Sodium: 136 mmol/L (ref 135–145)
Total Bilirubin: 0.2 mg/dL — ABNORMAL LOW (ref 0.3–1.2)
Total Protein: 8.1 g/dL (ref 6.5–8.1)

## 2023-01-09 LAB — PREGNANCY, URINE: Preg Test, Ur: NEGATIVE

## 2023-01-09 LAB — SALICYLATE LEVEL: Salicylate Lvl: <7 mg/dL — ABNORMAL LOW (ref 7.0–30.0)

## 2023-01-09 LAB — ACETAMINOPHEN LEVEL: Acetaminophen (Tylenol), Serum: <10 ug/mL — ABNORMAL LOW (ref 10–30)

## 2023-01-09 LAB — ETHANOL: Alcohol, Ethyl (B): 134 mg/dL — ABNORMAL HIGH (ref <10)

## 2023-01-09 MED ORDER — POTASSIUM CHLORIDE CRYS ER 20 MEQ PO TBCR
40.0000 meq | EXTENDED_RELEASE_TABLET | Freq: Once | ORAL | Status: AC
  Administered 2023-01-09: 40 meq via ORAL
  Filled 2023-01-09: qty 2

## 2023-01-09 MED ORDER — DIPHENHYDRAMINE HCL 25 MG PO CAPS
25.0000 mg | ORAL_CAPSULE | Freq: Once | ORAL | Status: AC
  Administered 2023-01-09: 25 mg via ORAL
  Filled 2023-01-09: qty 1

## 2023-01-09 MED ORDER — FAMOTIDINE 20 MG PO TABS
20.0000 mg | ORAL_TABLET | Freq: Once | ORAL | Status: AC
  Administered 2023-01-09: 20 mg via ORAL
  Filled 2023-01-09: qty 1

## 2023-01-09 NOTE — ED Notes (Signed)
Patient washed up Clean purple scrubs Clean sheets Brushed teeth
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
## 2023-01-09 NOTE — ED Provider Notes (Signed)
[place] [facility]  Provider Note  CSN: [id number] Arrival date & time: 01/09/23 0048  History Chief Complaint  Patient presents with   Suicidal    Gina Martin is a 32 y.o. female with history of bipolar disorder and schizophrenia, not on any medications. Had a prior suicide attempt by [facility] here with a family friend for evaluation of SI and depression worsening for the last two weeks. She has been hearing voices as well. She is feeling overwhelmed taking care of her kids at home. She denies any somatic complaints.    Home Medications Prior to Admission medications   Medication Sig Start Date End Date Taking? Authorizing Provider  acetaminophen (TYLENOL) 650 MG CR tablet Take 650 mg by mouth every 8 (eight) hours as needed for pain.    [provider]  albuterol (VENTOLIN HFA) 108 (90 Base) MCG/ACT inhaler Inhale 2 puffs into the lungs every 6 (six) hours as needed for wheezing or shortness of breath. 09/12/22   Verlee Monte, MD  aspirin EC 81 MG tablet Take 1 tablet (81 mg total) by mouth daily. Swallow whole. Patient not taking: Reported on 03/17/2022 12/15/21   West Bali, PA-C  budesonide-formoterol [facility]) 80-4.5 MCG/ACT inhaler At onset of respiratory illness/asthma flare: Inhale 2 puffs twice daily with spacer for 2 weeks or until symptoms resolve. 10/21/22   Verlee Monte, MD  cholecalciferol (VITAMIN D3) 25 MCG (1000 UNIT) tablet Take 1,000 Units by mouth in the morning.    [provider]  doxycycline (VIBRAMYCIN) 100 MG capsule Take 1 capsule (100 mg total) by mouth 2 (two) times daily. 02/28/22   Clark, Meghan R, PA-C  DYMISTA 137-50 MCG/ACT SUSP Place 1 spray into both nostrils 2 (two) times daily as needed. 10/24/22   Verlee Monte, MD  EPINEPHrine (EPIPEN 2-PAK) 0.3 mg/0.3 mL IJ SOAJ injection Inject 0.3 mg into the muscle as needed for anaphylaxis. 09/12/22   Verlee Monte, MD  ferrous sulfate 325 (65 FE)  MG tablet Take 325 mg by mouth in the morning.    [provider]  fluticasone (FLONASE) 50 MCG/ACT nasal spray Place 1 spray into both nostrils daily. 09/12/22   Verlee Monte, MD  hydrocortisone 2.5 % ointment Apply topically twice daily as needed to red sandpapery rash. 09/12/22   Verlee Monte, MD  metroNIDAZOLE (FLAGYL) 500 MG tablet Take 1 tablet (500 mg total) by mouth 2 (two) times daily. 02/28/22   Clark, Meghan R, PA-C  montelukast (SINGULAIR) 10 MG tablet Take 1 tablet (10 mg total) by mouth at bedtime. 10/21/22   Verlee Monte, MD  Multiple Vitamins-Minerals (MULTIVITAMIN WITH MINERALS) tablet Take 1 tablet by mouth in the morning.    [provider]  Olopatadine HCl (PATADAY) 0.2 % SOLN Place 1 drop into both eyes daily as needed. 10/21/22   Verlee Monte, MD  omeprazole (PRILOSEC) 40 MG capsule Take 40 mg by mouth in the morning and at bedtime. 02/07/22   [provider]  ondansetron (ZOFRAN-ODT) 4 MG disintegrating tablet Take 1 tablet (4 mg total) by mouth every 8 (eight) hours as needed for nausea or vomiting. Patient not taking: Reported on 09/12/2022 01/26/22   Tilden Fossa, MD  oxyCODONE-acetaminophen (PERCOCET) 5-325 MG tablet Take 1 tablet by mouth every 6 (six) hours as needed for severe pain. 03/18/22   West Bali, PA-C  triamcinolone ointment (KENALOG) 0.1 % Apply topically twice daily to BODY as  needed for red, sandpaper like rash.  Do not use on face, groin or armpits. 09/12/22   Verlee Monte, MD     Allergies    Amoxicillin   Review of Systems   Review of Systems Please see HPI for pertinent positives and negatives  Physical Exam BP (!) 132/99   Pulse 93   Temp 98.5 F (36.9 C)   Resp 17   LMP 11/26/2022   SpO2 100%   Physical Exam Vitals and nursing note reviewed.  Constitutional:      Appearance: Normal appearance.  HENT:     Head: Normocephalic and atraumatic.     Nose: Nose normal.     Mouth/Throat:     Mouth:  Mucous membranes are moist.  Eyes:     Extraocular Movements: Extraocular movements intact.     Conjunctiva/sclera: Conjunctivae normal.  Cardiovascular:     Rate and Rhythm: Normal rate.  Pulmonary:     Effort: Pulmonary effort is normal.     Breath sounds: Normal breath sounds.  Abdominal:     General: Abdomen is flat.     Palpations: Abdomen is soft.     Tenderness: There is no abdominal tenderness.  Musculoskeletal:        General: No swelling. Normal range of motion.     Cervical back: Neck supple.  Skin:    General: Skin is warm and dry.  Neurological:     General: No focal deficit present.     Mental Status: She is alert.  Psychiatric:     Comments: tearful     ED Results / Procedures / Treatments   EKG EKG Interpretation Date/Time:  Monday January 09 2023 02:04:29 EDT Ventricular Rate:  94 PR Interval:  158 QRS Duration:  82 QT Interval:  359 QTC Calculation: 449 R Axis:   70  Text Interpretation: Sinus rhythm ST elev, probable normal early repol pattern No significant change since last tracing Confirmed by Susy Frizzle [phone]) on 01/09/2023 2:06:19 AM  Procedures .Critical Care  Performed by: Pollyann Savoy, MD Authorized by: Pollyann Savoy, MD   Critical care provider statement:    Critical care time (minutes):  30   Critical care time was exclusive of:  Separately billable procedures and treating other patients   Critical care was time spent personally by me on the following activities:  Development of treatment plan with patient or surrogate, discussions with consultants, evaluation of patient's response to treatment, examination of patient, ordering and review of laboratory studies, ordering and review of radiographic studies, ordering and performing treatments and interventions, pulse oximetry, re-evaluation of patient's condition and review of old charts   Medications Ordered in the ED Medications  potassium chloride SA (KLOR-CON M) CR  tablet 40 mEq (40 mEq Oral Given 01/09/23 0218)    Initial Impression and Plan  Patient here voluntarily with depression and SI. She has an extensive mental health history. Will check labs for medical clearance and anticipate TTS consult.   ED Course   Clinical Course as of 01/09/23 0357  Mon Jan 09, 2023  0131 HCG is negative [CS]  0141 UDS is negative.  [CS]  0145 CBC with anemia [CS]  0203 CMP with hypokalemia, will add EKG and begin oral repletion. EtOH is elevated. ASA/APAP levels are neg. TTS consult ordered.  [CS]    Clinical Course User Index [CS] Pollyann Savoy, MD     MDM Rules/Calculators/A&P Medical Decision Making Problems Addressed: Alcoholic intoxication without complication [facility]):  acute illness or injury Hypokalemia: acute illness or injury Suicidal ideation: acute illness or injury  Amount and/or Complexity of Data Reviewed Labs: ordered. Decision-making details documented in ED Course. ECG/medicine tests: ordered and independent interpretation performed. Decision-making details documented in ED Course.  Risk Prescription drug management.     Final Clinical Impression(s) / ED Diagnoses Final diagnoses:  Suicidal ideation  Alcoholic intoxication without complication (HCC)  Hypokalemia    Rx / DC Orders ED Discharge Orders     None        Pollyann Savoy, MD 01/09/23 [phone]

## 2023-01-09 NOTE — ED Notes (Signed)
2 frozen chicken dinners

## 2023-01-09 NOTE — ED Provider Notes (Signed)
  Physical Exam  BP 132/83 (BP Location: Left Arm)   Pulse 85   Temp 99 F (37.2 C) (Oral)   Resp 16   LMP 11/26/2022   SpO2 100%   Physical Exam Gen: NAD Eyes: PERRL, EOMI HEENT: no oropharyngeal swelling Neck: trachea midline Resp: clear to auscultation bilaterally Card: RRR, no murmurs, rubs, or gallops Abd: nontender, nondistended Extremities: no calf tenderness, no edema Vascular: 2+ radial pulses bilaterally, 2+ DP pulses bilaterally Skin: no rashes Psyc: acting appropriately does not appear to be reacting to any internal/external stimuli  Procedures  Procedures  ED Course / MDM   Clinical Course as of 01/09/23 1203  Mon Jan 09, 2023  0131 HCG is negative [CS]  0141 UDS is negative.  [CS]  0145 CBC with anemia [CS]  0203 CMP with hypokalemia, will add EKG and begin oral repletion. EtOH is elevated. ASA/APAP levels are neg. TTS consult ordered.  [CS]    Clinical Course User Index [CS] Pollyann Savoy, MD   Medical Decision Making Amount and/or Complexity of Data Reviewed Labs: ordered.  Risk Prescription drug management.   I reevaluated the patient after she was evaluated by psychiatry this morning.  They recommended outpatient treatment.  I did discuss this with the patient and she is very calm and cooperative.  She does not have any tangential thinking and is denying any suicidal or homicidal ideations.  She will be discharged with psychiatric follow-up with return precautions.       Durwin Glaze, MD 01/09/23 [phone]

## 2023-01-09 NOTE — Discharge Instructions (Signed)
Please call to schedule a follow-up appointment one of the psychiatric treatment facilities at the number provided.  Please return to the emergency department for new or worsening symptoms.

## 2023-01-09 NOTE — ED Notes (Addendum)
Patient requested to use the phone.  When asking who would she like to call she stated that she has a [employer] appointment and that they opened up at 9 am.  Patient expressed concerns on being incarcerated
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
## 2023-01-09 NOTE — ED Notes (Signed)
TTS consult called

## 2023-01-09 NOTE — ED Notes (Signed)
EDP and primary RN aware of pt K 2.7.
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
## 2023-01-09 NOTE — ED Notes (Signed)
Cleared by psych services to discharge home. Notified Dr. Rhae Hammock. He will complete discharge paperwork and place needed referrals. Patient aware.

## 2023-01-13 ENCOUNTER — Ambulatory Visit: Payer: Medicaid Other | Admitting: Internal Medicine

## 2023-02-24 ENCOUNTER — Emergency Department (HOSPITAL_BASED_OUTPATIENT_CLINIC_OR_DEPARTMENT_OTHER): Payer: MEDICAID

## 2023-02-24 ENCOUNTER — Encounter (HOSPITAL_BASED_OUTPATIENT_CLINIC_OR_DEPARTMENT_OTHER): Payer: Self-pay | Admitting: Emergency Medicine

## 2023-02-24 ENCOUNTER — Other Ambulatory Visit: Payer: Self-pay

## 2023-02-24 ENCOUNTER — Emergency Department (HOSPITAL_BASED_OUTPATIENT_CLINIC_OR_DEPARTMENT_OTHER)
Admission: EM | Admit: 2023-02-24 | Discharge: 2023-02-24 | Disposition: A | Discharge status: Home / Self Care | Payer: MEDICAID | Attending: Emergency Medicine | Admitting: Emergency Medicine

## 2023-02-24 DIAGNOSIS — D649 Anemia, unspecified: Secondary | ICD-10-CM | POA: Diagnosis not present

## 2023-02-24 DIAGNOSIS — Z7982 Long term (current) use of aspirin: Secondary | ICD-10-CM | POA: Diagnosis not present

## 2023-02-24 DIAGNOSIS — T7840XA Allergy, unspecified, initial encounter: Secondary | ICD-10-CM | POA: Diagnosis present

## 2023-02-24 DIAGNOSIS — J45909 Unspecified asthma, uncomplicated: Secondary | ICD-10-CM | POA: Diagnosis not present

## 2023-02-24 LAB — CBC WITH DIFFERENTIAL/PLATELET
Abs Immature Granulocytes: 0.03 10*3/uL (ref 0.00–0.07)
Basophils Absolute: 0.1 10*3/uL (ref 0.0–0.1)
Basophils Relative: 1 %
Eosinophils Absolute: 0.3 10*3/uL (ref 0.0–0.5)
Eosinophils Relative: 3 %
HCT: 30.8 % — ABNORMAL LOW (ref 36.0–46.0)
Hemoglobin: 8.8 g/dL — ABNORMAL LOW (ref 12.0–15.0)
Immature Granulocytes: 0 %
Lymphocytes Relative: 24 %
Lymphs Abs: 2.2 10*3/uL (ref 0.7–4.0)
MCH: 20.1 pg — ABNORMAL LOW (ref 26.0–34.0)
MCHC: 28.6 g/dL — ABNORMAL LOW (ref 30.0–36.0)
MCV: 70.3 fL — ABNORMAL LOW (ref 80.0–100.0)
Monocytes Absolute: 0.6 10*3/uL (ref 0.1–1.0)
Monocytes Relative: 7 %
Neutro Abs: 6.2 10*3/uL (ref 1.7–7.7)
Neutrophils Relative %: 65 %
Platelets: 427 10*3/uL — ABNORMAL HIGH (ref 150–400)
RBC: 4.38 MIL/uL (ref 3.87–5.11)
RDW: 19.4 % — ABNORMAL HIGH (ref 11.5–15.5)
WBC: 9.4 10*3/uL (ref 4.0–10.5)
nRBC: 0 % (ref 0.0–0.2)

## 2023-02-24 LAB — TROPONIN I (HIGH SENSITIVITY): Troponin I (High Sensitivity): 11 ng/L (ref <18)

## 2023-02-24 LAB — BASIC METABOLIC PANEL
Anion gap: 12 (ref 5–15)
BUN: 8 mg/dL (ref 6–20)
CO2: 23 mmol/L (ref 22–32)
Calcium: 9.2 mg/dL (ref 8.9–10.3)
Chloride: 102 mmol/L (ref 98–111)
Creatinine, Ser: 0.71 mg/dL (ref 0.44–1.00)
GFR, Estimated: >60 mL/min (ref >60)
Glucose, Bld: 98 mg/dL (ref 70–99)
Potassium: 3.5 mmol/L (ref 3.5–5.1)
Sodium: 137 mmol/L (ref 135–145)

## 2023-02-24 LAB — PREGNANCY, URINE: Preg Test, Ur: NEGATIVE

## 2023-02-24 MED ORDER — FAMOTIDINE 40 MG PO TABS
40.0000 mg | ORAL_TABLET | Freq: Every day | ORAL | 0 refills | Status: AC
Start: 2023-02-24 — End: ?

## 2023-02-24 MED ORDER — FAMOTIDINE IN NACL 20-0.9 MG/50ML-% IV SOLN
20.0000 mg | Freq: Once | INTRAVENOUS | Status: AC
Start: 2023-02-24 — End: 2023-02-24
  Administered 2023-02-24: 20 mg via INTRAVENOUS
  Filled 2023-02-24: qty 50

## 2023-02-24 MED ORDER — DIPHENHYDRAMINE HCL 50 MG/ML IJ SOLN
25.0000 mg | Freq: Once | INTRAMUSCULAR | Status: AC
  Administered 2023-02-24: 25 mg via INTRAVENOUS
  Filled 2023-02-24: qty 1

## 2023-02-24 MED ORDER — LORATADINE 10 MG PO TABS
10.0000 mg | ORAL_TABLET | Freq: Once | ORAL | Status: AC
  Administered 2023-02-24: 10 mg via ORAL
  Filled 2023-02-24: qty 1

## 2023-02-24 MED ORDER — EPINEPHRINE 0.3 MG/0.3ML IJ SOAJ: 0.3000 mg | INTRAMUSCULAR | 0 refills | Status: AC | PRN

## 2023-02-24 MED ORDER — METHYLPREDNISOLONE SODIUM SUCC 125 MG IJ SOLR
125.0000 mg | INTRAMUSCULAR | Status: AC
  Administered 2023-02-24: 125 mg via INTRAVENOUS
  Filled 2023-02-24: qty 2

## 2023-02-24 MED ORDER — PREDNISONE 20 MG PO TABS
40.0000 mg | ORAL_TABLET | Freq: Every day | ORAL | 0 refills | Status: AC
Start: 2023-02-24 — End: 2023-03-01

## 2023-02-24 NOTE — ED Provider Notes (Signed)
[place] [facility] Provider Note   CSN: [id number] Arrival date & time: 02/24/23  4098     History {Add pertinent medical, surgical, social history, OB history to HPI:1} Chief Complaint  Patient presents with   Allergic Reaction    Gina Martin is a 32 y.o. female.   Allergic Reaction      Home Medications Prior to Admission medications   Medication Sig Start Date End Date Taking? Authorizing Provider  acetaminophen (TYLENOL) 650 MG CR tablet Take 650 mg by mouth every 8 (eight) hours as needed for pain.    [provider]  albuterol (VENTOLIN HFA) 108 (90 Base) MCG/ACT inhaler Inhale 2 puffs into the lungs every 6 (six) hours as needed for wheezing or shortness of breath. 09/12/22   Verlee Monte, MD  aspirin EC 81 MG tablet Take 1 tablet (81 mg total) by mouth daily. Swallow whole. Patient not taking: Reported on 03/17/2022 12/15/21   West Bali, PA-C  budesonide-formoterol [facility]) 80-4.5 MCG/ACT inhaler At onset of respiratory illness/asthma flare: Inhale 2 puffs twice daily with spacer for 2 weeks or until symptoms resolve. 10/21/22   Verlee Monte, MD  cholecalciferol (VITAMIN D3) 25 MCG (1000 UNIT) tablet Take 1,000 Units by mouth in the morning.    [provider]  doxycycline (VIBRAMYCIN) 100 MG capsule Take 1 capsule (100 mg total) by mouth 2 (two) times daily. 02/28/22   Clark, Meghan R, PA-C  DYMISTA 137-50 MCG/ACT SUSP Place 1 spray into both nostrils 2 (two) times daily as needed. 10/24/22   Verlee Monte, MD  EPINEPHrine (EPIPEN 2-PAK) 0.3 mg/0.3 mL IJ SOAJ injection Inject 0.3 mg into the muscle as needed for anaphylaxis. 09/12/22   Verlee Monte, MD  ferrous sulfate 325 (65 FE) MG tablet Take 325 mg by mouth in the morning.    [provider]  fluticasone (FLONASE) 50 MCG/ACT nasal spray Place 1 spray into both nostrils daily. 09/12/22   Verlee Monte, MD  hydrocortisone 2.5 % ointment  Apply topically twice daily as needed to red sandpapery rash. 09/12/22   Verlee Monte, MD  metroNIDAZOLE (FLAGYL) 500 MG tablet Take 1 tablet (500 mg total) by mouth 2 (two) times daily. 02/28/22   Clark, Meghan R, PA-C  montelukast (SINGULAIR) 10 MG tablet Take 1 tablet (10 mg total) by mouth at bedtime. 10/21/22   Verlee Monte, MD  Multiple Vitamins-Minerals (MULTIVITAMIN WITH MINERALS) tablet Take 1 tablet by mouth in the morning.    [provider]  Olopatadine HCl (PATADAY) 0.2 % SOLN Place 1 drop into both eyes daily as needed. 10/21/22   Verlee Monte, MD  omeprazole (PRILOSEC) 40 MG capsule Take 40 mg by mouth in the morning and at bedtime. 02/07/22   [provider]  ondansetron (ZOFRAN-ODT) 4 MG disintegrating tablet Take 1 tablet (4 mg total) by mouth every 8 (eight) hours as needed for nausea or vomiting. Patient not taking: Reported on 09/12/2022 01/26/22   Tilden Fossa, MD  oxyCODONE-acetaminophen (PERCOCET) 5-325 MG tablet Take 1 tablet by mouth every 6 (six) hours as needed for severe pain. 03/18/22   West Bali, PA-C  triamcinolone ointment (KENALOG) 0.1 % Apply topically twice daily to BODY as needed for red, sandpaper like rash.  Do not use on face, groin or armpits. 09/12/22   Verlee Monte, MD      Allergies    Bee venom and Amoxicillin  Review of Systems   Review of Systems  Physical Exam Updated Vital Signs BP 128/73   Pulse 80   Temp 98.8 F (37.1 C) (Oral)   Resp 16   Ht 5\' 11"  (1.803 m)   Wt 117 kg   SpO2 100%   BMI 35.98 kg/m  Physical Exam  ED Results / Procedures / Treatments   Labs (all labs ordered are listed, but only abnormal results are displayed) Labs Reviewed  CBC WITH DIFFERENTIAL/PLATELET - Abnormal; Notable for the following components:      Result Value   Hemoglobin 8.8 (*)    HCT 30.8 (*)    MCV 70.3 (*)    MCH 20.1 (*)    MCHC 28.6 (*)    RDW 19.4 (*)    Platelets 427 (*)    All other components within  normal limits  BASIC METABOLIC PANEL  PREGNANCY, URINE  TROPONIN I (HIGH SENSITIVITY)  TROPONIN I (HIGH SENSITIVITY)    EKG EKG Interpretation Date/Time:  Friday February 24 2023 11:57:53 EDT Ventricular Rate:  85 PR Interval:  163 QRS Duration:  71 QT Interval:  366 QTC Calculation: 436 R Axis:   73  Text Interpretation: Sinus rhythm RSR' in V1 or V2, probably normal variant Confirmed by Vanetta Mulders [phone]) on 02/24/2023 11:59:31 AM  Radiology DG Chest Portable 1 View  Result Date: 02/24/2023 CLINICAL DATA:  Shortness of breath. Swelling. Stung by a bee with a history of allergy EXAM: PORTABLE CHEST 1 VIEW COMPARISON:  X-ray 10/20/2022 FINDINGS: The heart size and mediastinal contours are within normal limits. No consolidation, pneumothorax or effusion. No edema. The visualized skeletal structures are unremarkable. IMPRESSION: No acute cardiopulmonary disease. Electronically Signed   By: Karen Kays M.D.   On: 02/24/2023 10:50    Procedures Procedures  Medications Ordered in ED Medications  methylPREDNISolone sodium succinate (SOLU-MEDROL) 125 mg/2 mL injection 125 mg (125 mg Intravenous Given 02/24/23 1033)  famotidine (PEPCID) IVPB 20 mg premix (0 mg Intravenous Stopped 02/24/23 1152)  diphenhydrAMINE (BENADRYL) injection 25 mg (25 mg Intravenous Given 02/24/23 1031)  loratadine (CLARITIN) tablet 10 mg (10 mg Oral Given 02/24/23 1036)    ED Course/ Medical Decision Making/ A&P    Medical Decision Making Amount and/or Complexity of Data Reviewed Labs: ordered. Radiology: ordered.  Risk OTC drugs. Prescription drug management.   32 y.o. female presents to the ER for evaluation of ***. Differential diagnosis includes but is not limited to ***. Vital signs ***. Physical exam as noted above.   I independently reviewed and interpreted the patient's labs. ***.  EKG reviewed and interpreted by my attending and read as Sinus rhythm RSR' in V1 or V2, probably normal  variant.  CXR shows no acute cardiopulmonary disease. .  On re-evaluation, the patient's hand swelling has improved significantly. She reports still some minor itching, but reports overall she is feeling much better. She agrees that he hand is less swollen.  Patient may be having some occasional chest pain or shortness of breath from body habitus or even her anemia.  This is not a new presentation.  She is not feeling any lightheadedness or dizziness, patient reports it was more fatigued when she was ambulating likely from the medication.  She reports that she takes iron pills however is not completely compliant with them because of the constipation.  It appears that her hemoglobin is slightly decreased from her previous one a month ago at 9.1.  I asked that she follow-up with her primary  care doctor for further evaluation of this and reminded her to remain compliant with her medications.  I also refilled her EpiPen and gave her prescription for prednisone and Pepcid to help with the swelling and itching in her hand.  She had take cetirizine daily.  Overall, her lung sounds are clear, vital signs are stable.  She does not appear in any acute distress.  She is stable for discharge home with close PCP follow-up.  We discussed the results of the labs/imaging. The plan is take medications as prescribed, follow up with PCP. We discussed strict return precautions and red flag symptoms. The patient verbalized their understanding and agrees to the plan. The patient is stable and being discharged home in good condition.  Portions of this report may have been transcribed using voice recognition software. Every effort was made to ensure accuracy; however, inadvertent computerized transcription errors may be present.   Final Clinical Impression(s) / ED Diagnoses Final diagnoses:  Allergic reaction, initial encounter    Rx / DC Orders ED Discharge Orders     None

## 2023-02-24 NOTE — ED Triage Notes (Addendum)
Stung on  left hand by bee yesterday and today and left hand swelling and   had to use her epi pennn took some benadryl last night

## 2023-02-24 NOTE — ED Provider Notes (Signed)
I provided a substantive portion of the care of this patient.  I personally made/approved the management plan for this patient and take responsibility for the patient management.  EKG Interpretation Date/Time:  Friday February 24 2023 11:57:53 EDT Ventricular Rate:  85 PR Interval:  163 QRS Duration:  71 QT Interval:  366 QTC Calculation: 436 R Axis:   73  Text Interpretation: Sinus rhythm RSR' in V1 or V2, probably normal variant Confirmed by Vanetta Mulders [phone]) on 02/24/2023 11:59:31 AM  Patient seen by me along with physician assistant.  Patient had a bee sting yesterday sounds like it was a yellowjacket to her right hand finger.  Now has a lot of swelling to the hand.  And pain.  Patient was concerned that when it occurred yesterday that she had an allergic reaction she did use her EpiPen.  Patient without any allergy type symptoms currently no lip swelling tongue swelling no difficulty breathing throat swelling no wheezing no hives or rash.  Findings of the right hand are consistent with a local reaction.  There was an area of a little dark area where the sting occurred but there is no's embedded sting.  I think this is just a little area of small necrosis in the tissues where the venom went in.  Patient stable for discharge home.   Vanetta Mulders, MD 02/24/23 [phone]

## 2023-02-24 NOTE — ED Notes (Signed)
Gina Martin O2 sat on RA while walking around the nurses' station=100%, her HR=98% No Shortness of Breath, but she states that she feels dizzy.

## 2023-02-24 NOTE — Discharge Instructions (Addendum)
You were seen in the emerged from today for evaluation of your allergic reaction.  I am glad that you are feeling better.  I going to send you home with a few medications to take.  1 is a steroid that should help with the inflammation and swelling of your hand.  Additionally in the medicine you home with some Pepcid which will help with the itching.  You will also take Benadryl as needed.  Please make sure you are still taking your daily cetirizine.  I have also refilled your EpiPen's.  You can have something called rebound anaphylaxis which means you can have allergies or chest pain or shortness of breath or throat swelling up to a week afterwards.  Please make sure you are monitoring your symptoms.  I included more information on anaphylaxis and allergies into the discharge paperwork.  Please review.  Additionally, he your anemia is around his baseline however it looks like it has been worsening.  Please make sure you are following up with your primary care provider for follow-up on this.  If you have any concerns, new or worsening symptoms, please return to your nearest emergency department for evaluation.  Contact a health care provider if: Your symptoms do not get better with treatment. Get help right away if: You have any symptoms of anaphylactic reaction. You use an auto-injector pen. You will need more medical care even if the medicine seems to be working. An anaphylactic reaction may happen again within 72 hours (rebound anaphylaxis). These symptoms may be an emergency. Use the auto-injector pen right away. Then call 911. Do not wait to see if the symptoms will go away. Do not drive yourself to the hospital.

## 2023-06-04 ENCOUNTER — Other Ambulatory Visit: Payer: Self-pay

## 2023-06-04 ENCOUNTER — Encounter (HOSPITAL_BASED_OUTPATIENT_CLINIC_OR_DEPARTMENT_OTHER): Payer: Self-pay | Admitting: Emergency Medicine

## 2023-06-04 DIAGNOSIS — Z7951 Long term (current) use of inhaled steroids: Secondary | ICD-10-CM | POA: Diagnosis not present

## 2023-06-04 DIAGNOSIS — R1012 Left upper quadrant pain: Secondary | ICD-10-CM | POA: Insufficient documentation

## 2023-06-04 DIAGNOSIS — J45909 Unspecified asthma, uncomplicated: Secondary | ICD-10-CM | POA: Insufficient documentation

## 2023-06-04 DIAGNOSIS — Z7982 Long term (current) use of aspirin: Secondary | ICD-10-CM | POA: Diagnosis not present

## 2023-06-04 LAB — CBC WITH DIFFERENTIAL/PLATELET
Abs Immature Granulocytes: 0.04 10*3/uL (ref 0.00–0.07)
Basophils Absolute: 0 10*3/uL (ref 0.0–0.1)
Basophils Relative: 0 %
Eosinophils Absolute: 0.2 10*3/uL (ref 0.0–0.5)
Eosinophils Relative: 2 %
HCT: 39.3 % (ref 36.0–46.0)
Hemoglobin: 12.6 g/dL (ref 12.0–15.0)
Immature Granulocytes: 0 %
Lymphocytes Relative: 31 %
Lymphs Abs: 3.2 10*3/uL (ref 0.7–4.0)
MCH: 25.4 pg — ABNORMAL LOW (ref 26.0–34.0)
MCHC: 32.1 g/dL (ref 30.0–36.0)
MCV: 79.2 fL — ABNORMAL LOW (ref 80.0–100.0)
Monocytes Absolute: 0.7 10*3/uL (ref 0.1–1.0)
Monocytes Relative: 7 %
Neutro Abs: 6.3 10*3/uL (ref 1.7–7.7)
Neutrophils Relative %: 60 %
Platelets: 380 10*3/uL (ref 150–400)
RBC: 4.96 MIL/uL (ref 3.87–5.11)
RDW: 14.7 % (ref 11.5–15.5)
WBC: 10.5 10*3/uL (ref 4.0–10.5)
nRBC: 0 % (ref 0.0–0.2)

## 2023-06-04 LAB — COMPREHENSIVE METABOLIC PANEL
ALT: 19 U/L (ref 0–44)
AST: 18 U/L (ref 15–41)
Albumin: 3.7 g/dL (ref 3.5–5.0)
Alkaline Phosphatase: 103 U/L (ref 38–126)
Anion gap: 10 (ref 5–15)
BUN: 11 mg/dL (ref 6–20)
CO2: 19 mmol/L — ABNORMAL LOW (ref 22–32)
Calcium: 9.2 mg/dL (ref 8.9–10.3)
Chloride: 103 mmol/L (ref 98–111)
Creatinine, Ser: 0.87 mg/dL (ref 0.44–1.00)
GFR, Estimated: >60 mL/min (ref >60)
Glucose, Bld: 104 mg/dL — ABNORMAL HIGH (ref 70–99)
Potassium: 3.5 mmol/L (ref 3.5–5.1)
Sodium: 132 mmol/L — ABNORMAL LOW (ref 135–145)
Total Bilirubin: 0.2 mg/dL (ref 0.0–1.2)
Total Protein: 7.7 g/dL (ref 6.5–8.1)

## 2023-06-04 LAB — LIPASE, BLOOD: Lipase: 22 U/L (ref 11–51)

## 2023-06-04 NOTE — ED Triage Notes (Addendum)
Pt c/o intermittent LUQ pain since Thurs, +nausea, able to tolerate food

## 2023-06-05 ENCOUNTER — Emergency Department (HOSPITAL_BASED_OUTPATIENT_CLINIC_OR_DEPARTMENT_OTHER)
Admission: EM | Admit: 2023-06-05 | Discharge: 2023-06-05 | Disposition: A | Discharge status: Home / Self Care | Payer: MEDICAID | Attending: Emergency Medicine | Admitting: Emergency Medicine

## 2023-06-05 ENCOUNTER — Encounter (HOSPITAL_BASED_OUTPATIENT_CLINIC_OR_DEPARTMENT_OTHER): Payer: Self-pay

## 2023-06-05 ENCOUNTER — Emergency Department (HOSPITAL_BASED_OUTPATIENT_CLINIC_OR_DEPARTMENT_OTHER): Payer: MEDICAID

## 2023-06-05 DIAGNOSIS — R1012 Left upper quadrant pain: Secondary | ICD-10-CM

## 2023-06-05 LAB — URINALYSIS, ROUTINE W REFLEX MICROSCOPIC
Bilirubin Urine: NEGATIVE
Glucose, UA: NEGATIVE mg/dL
Hgb urine dipstick: NEGATIVE
Ketones, ur: NEGATIVE mg/dL
Leukocytes,Ua: NEGATIVE
Nitrite: NEGATIVE
Protein, ur: NEGATIVE mg/dL
Specific Gravity, Urine: 1.025 (ref 1.005–1.030)
pH: 5.5 (ref 5.0–8.0)

## 2023-06-05 LAB — PREGNANCY, URINE: Preg Test, Ur: NEGATIVE

## 2023-06-05 MED ORDER — IOHEXOL 300 MG/ML  SOLN
100.0000 mL | Freq: Once | INTRAMUSCULAR | Status: AC | PRN
  Administered 2023-06-05: 100 mL via INTRAVENOUS

## 2023-06-05 MED ORDER — KETOROLAC TROMETHAMINE 30 MG/ML IJ SOLN
30.0000 mg | Freq: Once | INTRAMUSCULAR | Status: AC
Start: 2023-06-05 — End: 2023-06-05
  Administered 2023-06-05: 30 mg via INTRAVENOUS
  Filled 2023-06-05: qty 1

## 2023-06-05 MED ORDER — SODIUM CHLORIDE 0.9 % IV BOLUS
1000.0000 mL | Freq: Once | INTRAVENOUS | Status: AC
  Administered 2023-06-05: 1000 mL via INTRAVENOUS

## 2023-06-05 NOTE — Discharge Instructions (Addendum)
Take ibuprofen 600 mg every 6 hours as needed for pain.  Follow-up with primary doctor if not improving in the next week, and return to the ER if you develop worsening pain, high fever, bloody stools, or for other new and concerning symptoms.

## 2023-06-05 NOTE — ED Notes (Signed)
 Patient transported to CT

## 2023-06-05 NOTE — ED Provider Notes (Signed)
[place] [facility] Provider Note   CSN: [id number] Arrival date & time: 06/04/23  2218     History  Chief Complaint  Patient presents with   Abdominal Pain    Gina Martin is a 33 y.o. female.  Patient is a 33 year old female with past medical history of asthma, GERD.  Patient presenting today with complaints of abdominal pain.  She describes a 3-day history of constant pain to the left upper quadrant.  This seems to be worse when she lays flat.  Pain seems somewhat better when she moves.  No bowel or bladder complaints.  No fevers or chills.  She has taken some Tylenol with little relief.  The history is provided by the patient.       Home Medications Prior to Admission medications   Medication Sig Start Date End Date Taking? Authorizing Provider  acetaminophen (TYLENOL) 650 MG CR tablet Take 650 mg by mouth every 8 (eight) hours as needed for pain.    [provider]  albuterol (VENTOLIN HFA) 108 (90 Base) MCG/ACT inhaler Inhale 2 puffs into the lungs every 6 (six) hours as needed for wheezing or shortness of breath. 09/12/22   Verlee Monte, MD  aspirin EC 81 MG tablet Take 1 tablet (81 mg total) by mouth daily. Swallow whole. Patient not taking: Reported on 03/17/2022 12/15/21   [place] Bali, PA-C  budesonide-formoterol [facility]) 80-4.5 MCG/ACT inhaler At onset of respiratory illness/asthma flare: Inhale 2 puffs twice daily with spacer for 2 weeks or until symptoms resolve. 10/21/22   Verlee Monte, MD  cholecalciferol (VITAMIN D3) 25 MCG (1000 UNIT) tablet Take 1,000 Units by mouth in the morning.    [provider]  doxycycline (VIBRAMYCIN) 100 MG capsule Take 1 capsule (100 mg total) by mouth 2 (two) times daily. 02/28/22   Clark, Meghan R, PA-C  DYMISTA 137-50 MCG/ACT SUSP Place 1 spray into both nostrils 2 (two) times daily as needed. 10/24/22   Verlee Monte, MD  EPINEPHrine (EPIPEN 2-PAK) 0.3 mg/0.3 mL IJ  SOAJ injection Inject 0.3 mg into the muscle as needed for anaphylaxis. 02/24/23   Achille Rich, PA-C  famotidine (PEPCID) 40 MG tablet Take 1 tablet (40 mg total) by mouth daily. 02/24/23   Achille Rich, PA-C  ferrous sulfate 325 (65 FE) MG tablet Take 325 mg by mouth in the morning.    [provider]  fluticasone (FLONASE) 50 MCG/ACT nasal spray Place 1 spray into both nostrils daily. 09/12/22   Verlee Monte, MD  hydrocortisone 2.5 % ointment Apply topically twice daily as needed to red sandpapery rash. 09/12/22   Verlee Monte, MD  metroNIDAZOLE (FLAGYL) 500 MG tablet Take 1 tablet (500 mg total) by mouth 2 (two) times daily. 02/28/22   Clark, Meghan R, PA-C  montelukast (SINGULAIR) 10 MG tablet Take 1 tablet (10 mg total) by mouth at bedtime. 10/21/22   Verlee Monte, MD  Multiple Vitamins-Minerals (MULTIVITAMIN WITH MINERALS) tablet Take 1 tablet by mouth in the morning.    [provider]  Olopatadine HCl (PATADAY) 0.2 % SOLN Place 1 drop into both eyes daily as needed. 10/21/22   Verlee Monte, MD  omeprazole (PRILOSEC) 40 MG capsule Take 40 mg by mouth in the morning and at bedtime. 02/07/22   [provider]  ondansetron (ZOFRAN-ODT) 4 MG disintegrating tablet Take 1 tablet (4 mg total) by mouth every 8 (eight) hours as needed for nausea or vomiting.  Patient not taking: Reported on 09/12/2022 01/26/22   Tilden Fossa, MD  oxyCODONE-acetaminophen (PERCOCET) 5-325 MG tablet Take 1 tablet by mouth every 6 (six) hours as needed for severe pain. 03/18/22   West Bali, PA-C  triamcinolone ointment (KENALOG) 0.1 % Apply topically twice daily to BODY as needed for red, sandpaper like rash.  Do not use on face, groin or armpits. 09/12/22   Verlee Monte, MD      Allergies    Bee venom and Amoxicillin    Review of Systems   Review of Systems  All other systems reviewed and are negative.   Physical Exam Updated Vital Signs BP (!) 167/106 (BP Location: Right  Wrist)   Pulse 99   Temp 99.2 F (37.3 C) (Oral)   Resp 20   Ht 5\' 11"  (1.803 m)   Wt 113 kg   LMP 05/15/2023   SpO2 100%   BMI 34.75 kg/m  Physical Exam Vitals and nursing note reviewed.  Constitutional:      General: She is not in acute distress.    Appearance: She is well-developed. She is not diaphoretic.  HENT:     Head: Normocephalic and atraumatic.  Cardiovascular:     Rate and Rhythm: Normal rate and regular rhythm.     Heart sounds: No murmur heard.    No friction rub. No gallop.  Pulmonary:     Effort: Pulmonary effort is normal. No respiratory distress.     Breath sounds: Normal breath sounds. No wheezing.  Abdominal:     General: Bowel sounds are normal. There is no distension.     Palpations: Abdomen is soft.     Tenderness: There is abdominal tenderness in the left upper quadrant. There is no right CVA tenderness, left CVA tenderness, guarding or rebound.  Musculoskeletal:        General: Normal range of motion.     Cervical back: Normal range of motion and neck supple.  Skin:    General: Skin is warm and dry.  Neurological:     General: No focal deficit present.     Mental Status: She is alert and oriented to person, place, and time.     ED Results / Procedures / Treatments   Labs (all labs ordered are listed, but only abnormal results are displayed) Labs Reviewed  COMPREHENSIVE METABOLIC PANEL - Abnormal; Notable for the following components:      Result Value   Sodium 132 (*)    CO2 19 (*)    Glucose, Bld 104 (*)    All other components within normal limits  CBC WITH DIFFERENTIAL/PLATELET - Abnormal; Notable for the following components:   MCV 79.2 (*)    MCH 25.4 (*)    All other components within normal limits  LIPASE, BLOOD  URINALYSIS, ROUTINE W REFLEX MICROSCOPIC  PREGNANCY, URINE    EKG None  Radiology No results found.  Procedures Procedures    Medications Ordered in ED Medications - No data to display  ED Course/ Medical  Decision Making/ A&P  Patient is a 33 year old female presenting with abdominal pain as described in the HPI.  Patient arrives with stable vital signs and is afebrile.  There is mild tenderness to the left upper quadrant, but exam otherwise unremarkable.  Laboratory studies obtained including CBC, CMP, and lipase, all of which are unremarkable.  Urinalysis is clear and pregnancy test is negative.  CT scan of the abdomen and pelvis obtained showing no acute intra-abdominal pathology.  Patient has received IV Toradol and IV fluids and seems to be feeling better.  Cause of her pain is unclear, but nothing appears emergent and I feel as though patient can safely be discharged.  Final Clinical Impression(s) / ED Diagnoses Final diagnoses:  None    Rx / DC Orders ED Discharge Orders     None         Geoffery Lyons, MD 06/05/23 [phone]
